# Patient Record
Sex: Female | Born: 1935 | Race: White | Hispanic: No | Marital: Married | Smoking: Current every day smoker
Health system: Southern US, Community
[De-identification: ages and names within clinical notes are randomized; demographics above are authoritative.]

## PROBLEM LIST (undated history)

## (undated) DIAGNOSIS — I951 Orthostatic hypotension: Secondary | ICD-10-CM

## (undated) DIAGNOSIS — N183 Chronic kidney disease, stage 3 unspecified: Secondary | ICD-10-CM

## (undated) DIAGNOSIS — F419 Anxiety disorder, unspecified: Secondary | ICD-10-CM

## (undated) DIAGNOSIS — G459 Transient cerebral ischemic attack, unspecified: Secondary | ICD-10-CM

## (undated) DIAGNOSIS — I4821 Permanent atrial fibrillation: Secondary | ICD-10-CM

## (undated) DIAGNOSIS — Z9889 Other specified postprocedural states: Secondary | ICD-10-CM

## (undated) DIAGNOSIS — Z950 Presence of cardiac pacemaker: Secondary | ICD-10-CM

## (undated) DIAGNOSIS — I498 Other specified cardiac arrhythmias: Secondary | ICD-10-CM

## (undated) DIAGNOSIS — Z9989 Dependence on other enabling machines and devices: Secondary | ICD-10-CM

## (undated) DIAGNOSIS — F329 Major depressive disorder, single episode, unspecified: Secondary | ICD-10-CM

## (undated) DIAGNOSIS — S52022A Displaced fracture of olecranon process without intraarticular extension of left ulna, initial encounter for closed fracture: Secondary | ICD-10-CM

## (undated) DIAGNOSIS — E46 Unspecified protein-calorie malnutrition: Secondary | ICD-10-CM

## (undated) DIAGNOSIS — R51 Headache: Secondary | ICD-10-CM

## (undated) DIAGNOSIS — F32A Depression, unspecified: Secondary | ICD-10-CM

## (undated) DIAGNOSIS — E119 Type 2 diabetes mellitus without complications: Secondary | ICD-10-CM

## (undated) DIAGNOSIS — M8580 Other specified disorders of bone density and structure, unspecified site: Secondary | ICD-10-CM

## (undated) DIAGNOSIS — M1990 Unspecified osteoarthritis, unspecified site: Secondary | ICD-10-CM

## (undated) DIAGNOSIS — J449 Chronic obstructive pulmonary disease, unspecified: Secondary | ICD-10-CM

## (undated) DIAGNOSIS — I10 Essential (primary) hypertension: Secondary | ICD-10-CM

## (undated) DIAGNOSIS — R519 Headache, unspecified: Secondary | ICD-10-CM

## (undated) HISTORY — DX: Permanent atrial fibrillation: I48.21

## (undated) HISTORY — DX: Major depressive disorder, single episode, unspecified: F32.9

## (undated) HISTORY — PX: OTHER SURGICAL HISTORY: SHX169

## (undated) HISTORY — PX: CYST REMOVAL TRUNK: SHX6283

## (undated) HISTORY — DX: Chronic obstructive pulmonary disease, unspecified: J44.9

## (undated) HISTORY — DX: Orthostatic hypotension: I95.1

## (undated) HISTORY — DX: Other specified disorders of bone density and structure, unspecified site: M85.80

## (undated) HISTORY — PX: WISDOM TOOTH EXTRACTION: SHX21

## (undated) HISTORY — DX: Other specified cardiac arrhythmias: I49.8

## (undated) HISTORY — DX: Essential (primary) hypertension: I10

## (undated) HISTORY — DX: Depression, unspecified: F32.A

## (undated) HISTORY — DX: Headache, unspecified: R51.9

## (undated) HISTORY — DX: Presence of cardiac pacemaker: Z95.0

## (undated) HISTORY — DX: Type 2 diabetes mellitus without complications: E11.9

## (undated) HISTORY — DX: Headache: R51

## (undated) HISTORY — PX: PACEMAKER INSERTION: SHX728

## (undated) HISTORY — DX: Transient cerebral ischemic attack, unspecified: G45.9

## (undated) HISTORY — DX: Unspecified protein-calorie malnutrition: E46

## (undated) HISTORY — DX: Other specified postprocedural states: Z98.890

## (undated) HISTORY — DX: Chronic kidney disease, stage 3 unspecified: N18.30

## (undated) HISTORY — DX: Chronic kidney disease, stage 3 (moderate): N18.3

---

## 1998-07-20 ENCOUNTER — Other Ambulatory Visit: Admission: RE | Admit: 1998-07-20 | Discharge: 1998-07-20 | Payer: Self-pay | Admitting: *Deleted

## 1999-07-29 ENCOUNTER — Other Ambulatory Visit: Admission: RE | Admit: 1999-07-29 | Discharge: 1999-07-29 | Payer: Self-pay | Admitting: *Deleted

## 2001-07-30 ENCOUNTER — Encounter: Admission: RE | Admit: 2001-07-30 | Discharge: 2001-07-30 | Payer: Self-pay | Admitting: *Deleted

## 2001-07-30 ENCOUNTER — Encounter: Payer: Self-pay | Admitting: *Deleted

## 2004-12-01 ENCOUNTER — Emergency Department (HOSPITAL_COMMUNITY): Admission: EM | Admit: 2004-12-01 | Discharge: 2004-12-01 | Payer: Self-pay | Admitting: Emergency Medicine

## 2004-12-03 ENCOUNTER — Encounter: Admission: RE | Admit: 2004-12-03 | Discharge: 2004-12-03 | Payer: Self-pay | Admitting: Urology

## 2006-08-24 ENCOUNTER — Inpatient Hospital Stay (HOSPITAL_COMMUNITY): Admission: AD | Admit: 2006-08-24 | Discharge: 2006-08-27 | Payer: Self-pay | Admitting: Cardiology

## 2006-08-28 ENCOUNTER — Inpatient Hospital Stay (HOSPITAL_COMMUNITY): Admission: AD | Admit: 2006-08-28 | Discharge: 2006-09-04 | Payer: Self-pay | Admitting: Cardiology

## 2006-08-28 ENCOUNTER — Ambulatory Visit: Payer: Self-pay | Admitting: Internal Medicine

## 2006-08-31 ENCOUNTER — Encounter (INDEPENDENT_AMBULATORY_CARE_PROVIDER_SITE_OTHER): Payer: Self-pay | Admitting: Cardiology

## 2006-10-05 ENCOUNTER — Inpatient Hospital Stay (HOSPITAL_COMMUNITY): Admission: EM | Admit: 2006-10-05 | Discharge: 2006-10-06 | Payer: Self-pay | Admitting: Emergency Medicine

## 2006-10-06 ENCOUNTER — Encounter (INDEPENDENT_AMBULATORY_CARE_PROVIDER_SITE_OTHER): Payer: Self-pay | Admitting: Internal Medicine

## 2006-10-06 ENCOUNTER — Ambulatory Visit: Payer: Self-pay | Admitting: Vascular Surgery

## 2006-10-23 ENCOUNTER — Ambulatory Visit (HOSPITAL_COMMUNITY): Admission: RE | Admit: 2006-10-23 | Discharge: 2006-10-23 | Payer: Self-pay | Admitting: Cardiology

## 2006-11-08 ENCOUNTER — Inpatient Hospital Stay (HOSPITAL_COMMUNITY): Admission: EM | Admit: 2006-11-08 | Discharge: 2006-11-13 | Payer: Self-pay | Admitting: Emergency Medicine

## 2006-11-08 ENCOUNTER — Ambulatory Visit: Payer: Self-pay | Admitting: Internal Medicine

## 2006-12-31 ENCOUNTER — Inpatient Hospital Stay (HOSPITAL_COMMUNITY): Admission: EM | Admit: 2006-12-31 | Discharge: 2007-01-02 | Payer: Self-pay | Admitting: Emergency Medicine

## 2007-01-28 ENCOUNTER — Ambulatory Visit: Payer: Self-pay | Admitting: Oncology

## 2007-11-22 ENCOUNTER — Emergency Department (HOSPITAL_COMMUNITY): Admission: EM | Admit: 2007-11-22 | Discharge: 2007-11-23 | Payer: Self-pay | Admitting: Emergency Medicine

## 2008-05-28 ENCOUNTER — Ambulatory Visit: Payer: Self-pay | Admitting: Internal Medicine

## 2008-05-28 ENCOUNTER — Inpatient Hospital Stay (HOSPITAL_COMMUNITY): Admission: EM | Admit: 2008-05-28 | Discharge: 2008-06-03 | Payer: Self-pay | Admitting: Emergency Medicine

## 2008-06-06 ENCOUNTER — Encounter: Admission: RE | Admit: 2008-06-06 | Discharge: 2008-06-06 | Payer: Self-pay | Admitting: Family Medicine

## 2008-08-04 ENCOUNTER — Emergency Department (HOSPITAL_COMMUNITY): Admission: EM | Admit: 2008-08-04 | Discharge: 2008-08-05 | Payer: Self-pay | Admitting: Emergency Medicine

## 2008-09-08 ENCOUNTER — Encounter: Admission: RE | Admit: 2008-09-08 | Discharge: 2008-09-08 | Payer: Self-pay | Admitting: Family Medicine

## 2010-05-19 HISTORY — PX: ABLATION: SHX5711

## 2010-06-09 ENCOUNTER — Encounter: Payer: Self-pay | Admitting: Urology

## 2010-07-07 ENCOUNTER — Emergency Department (HOSPITAL_COMMUNITY): Payer: Medicare Other

## 2010-07-07 ENCOUNTER — Inpatient Hospital Stay (HOSPITAL_COMMUNITY)
Admission: EM | Admit: 2010-07-07 | Discharge: 2010-07-09 | DRG: 313 | Disposition: A | Discharge status: Home / Self Care | Payer: Medicare Other | Attending: Cardiology | Admitting: Cardiology

## 2010-07-07 DIAGNOSIS — R079 Chest pain, unspecified: Secondary | ICD-10-CM

## 2010-07-07 DIAGNOSIS — I4892 Unspecified atrial flutter: Secondary | ICD-10-CM | POA: Diagnosis present

## 2010-07-07 DIAGNOSIS — I10 Essential (primary) hypertension: Secondary | ICD-10-CM | POA: Diagnosis present

## 2010-07-07 DIAGNOSIS — Z7982 Long term (current) use of aspirin: Secondary | ICD-10-CM

## 2010-07-07 DIAGNOSIS — Z7901 Long term (current) use of anticoagulants: Secondary | ICD-10-CM

## 2010-07-07 DIAGNOSIS — R0789 Other chest pain: Principal | ICD-10-CM | POA: Diagnosis present

## 2010-07-07 DIAGNOSIS — E119 Type 2 diabetes mellitus without complications: Secondary | ICD-10-CM | POA: Diagnosis present

## 2010-07-07 DIAGNOSIS — E785 Hyperlipidemia, unspecified: Secondary | ICD-10-CM | POA: Diagnosis present

## 2010-07-07 DIAGNOSIS — Z950 Presence of cardiac pacemaker: Secondary | ICD-10-CM

## 2010-07-07 DIAGNOSIS — F17200 Nicotine dependence, unspecified, uncomplicated: Secondary | ICD-10-CM | POA: Diagnosis present

## 2010-07-07 DIAGNOSIS — I495 Sick sinus syndrome: Secondary | ICD-10-CM | POA: Diagnosis present

## 2010-07-07 LAB — CBC
HCT: 40.4 % (ref 36.0–46.0)
Hemoglobin: 13.5 g/dL (ref 12.0–15.0)
MCH: 30.8 pg (ref 26.0–34.0)
MCHC: 33.4 g/dL (ref 30.0–36.0)
MCV: 92 fL (ref 78.0–100.0)
Platelets: 201 10*3/uL (ref 150–400)
RBC: 4.39 MIL/uL (ref 3.87–5.11)
RDW: 13.9 % (ref 11.5–15.5)
WBC: 8.9 10*3/uL (ref 4.0–10.5)

## 2010-07-07 LAB — CARDIAC PANEL(CRET KIN+CKTOT+MB+TROPI)
CK, MB: 1.7 ng/mL (ref 0.3–4.0)
CK, MB: 1.6 ng/mL (ref 0.3–4.0)
Relative Index: INVALID (ref 0.0–2.5)
Relative Index: INVALID (ref 0.0–2.5)
Total CK: 53 U/L (ref 7–177)
Total CK: 58 U/L (ref 7–177)
Troponin I: <0.01 ng/mL (ref 0.00–0.06)
Troponin I: 0.02 ng/mL (ref 0.00–0.06)

## 2010-07-07 LAB — COMPREHENSIVE METABOLIC PANEL
ALT: 19 U/L (ref 0–35)
AST: 23 U/L (ref 0–37)
Albumin: 3.8 g/dL (ref 3.5–5.2)
Alkaline Phosphatase: 61 U/L (ref 39–117)
BUN: 17 mg/dL (ref 6–23)
CO2: 24 mEq/L (ref 19–32)
Calcium: 9.5 mg/dL (ref 8.4–10.5)
Chloride: 102 mEq/L (ref 96–112)
Creatinine, Ser: 0.88 mg/dL (ref 0.4–1.2)
GFR calc Af Amer: >60 mL/min (ref >60)
GFR calc non Af Amer: >60 mL/min (ref >60)
Glucose, Bld: 85 mg/dL (ref 70–99)
Potassium: 4.1 mEq/L (ref 3.5–5.1)
Sodium: 136 mEq/L (ref 135–145)
Total Bilirubin: 0.4 mg/dL (ref 0.3–1.2)
Total Protein: 6.4 g/dL (ref 6.0–8.3)

## 2010-07-07 LAB — APTT: aPTT: 47 seconds — ABNORMAL HIGH (ref 24–37)

## 2010-07-07 LAB — DIFFERENTIAL
Basophils Absolute: 0.1 10*3/uL (ref 0.0–0.1)
Basophils Relative: 1 % (ref 0–1)
Eosinophils Absolute: 0.3 10*3/uL (ref 0.0–0.7)
Eosinophils Relative: 3 % (ref 0–5)
Lymphocytes Relative: 34 % (ref 12–46)
Lymphs Abs: 3 10*3/uL (ref 0.7–4.0)
Monocytes Absolute: 0.7 10*3/uL (ref 0.1–1.0)
Monocytes Relative: 8 % (ref 3–12)
Neutro Abs: 4.8 10*3/uL (ref 1.7–7.7)
Neutrophils Relative %: 54 % (ref 43–77)

## 2010-07-07 LAB — GLUCOSE, CAPILLARY
Glucose-Capillary: 122 mg/dL — ABNORMAL HIGH (ref 70–99)
Glucose-Capillary: 131 mg/dL — ABNORMAL HIGH (ref 70–99)
Glucose-Capillary: 162 mg/dL — ABNORMAL HIGH (ref 70–99)
Glucose-Capillary: 98 mg/dL (ref 70–99)

## 2010-07-07 LAB — CK TOTAL AND CKMB (NOT AT ARMC)
CK, MB: 1.8 ng/mL (ref 0.3–4.0)
Relative Index: INVALID (ref 0.0–2.5)
Total CK: 65 U/L (ref 7–177)

## 2010-07-07 LAB — POCT CARDIAC MARKERS
CKMB, poc: <1 ng/mL — ABNORMAL LOW (ref 1.0–8.0)
Myoglobin, poc: 34.3 ng/mL (ref 12–200)
Troponin i, poc: <0.05 ng/mL (ref 0.00–0.09)

## 2010-07-07 LAB — HEMOGLOBIN A1C
Hgb A1c MFr Bld: 6.7 % — ABNORMAL HIGH (ref <5.7)
Mean Plasma Glucose: 146 mg/dL — ABNORMAL HIGH (ref <117)

## 2010-07-07 LAB — PROTIME-INR
INR: 2.67 — ABNORMAL HIGH (ref 0.00–1.49)
Prothrombin Time: 28.5 seconds — ABNORMAL HIGH (ref 11.6–15.2)

## 2010-07-07 LAB — MRSA PCR SCREENING: MRSA by PCR: NEGATIVE

## 2010-07-07 LAB — TROPONIN I: Troponin I: 0.01 ng/mL (ref 0.00–0.06)

## 2010-07-07 LAB — BRAIN NATRIURETIC PEPTIDE: Pro B Natriuretic peptide (BNP): 46 pg/mL (ref 0.0–100.0)

## 2010-07-08 LAB — GLUCOSE, CAPILLARY
Glucose-Capillary: 158 mg/dL — ABNORMAL HIGH (ref 70–99)
Glucose-Capillary: 146 mg/dL — ABNORMAL HIGH (ref 70–99)
Glucose-Capillary: 151 mg/dL — ABNORMAL HIGH (ref 70–99)
Glucose-Capillary: 158 mg/dL — ABNORMAL HIGH (ref 70–99)

## 2010-07-08 LAB — LIPID PANEL
Cholesterol: 89 mg/dL (ref 0–200)
HDL: 35 mg/dL — ABNORMAL LOW (ref >39)
LDL Cholesterol: 34 mg/dL (ref 0–99)
Total CHOL/HDL Ratio: 2.5 RATIO
Triglycerides: 99 mg/dL (ref <150)
VLDL: 20 mg/dL (ref 0–40)

## 2010-07-08 LAB — PROTIME-INR
INR: 2.08 — ABNORMAL HIGH (ref 0.00–1.49)
Prothrombin Time: 23.5 seconds — ABNORMAL HIGH (ref 11.6–15.2)

## 2010-07-08 LAB — HEPARIN LEVEL (UNFRACTIONATED): Heparin Unfractionated: <0.1 IU/mL — ABNORMAL LOW (ref 0.30–0.70)

## 2010-07-09 LAB — CBC
HCT: 42 % (ref 36.0–46.0)
Hemoglobin: 14.1 g/dL (ref 12.0–15.0)
MCH: 30.5 pg (ref 26.0–34.0)
MCHC: 33.6 g/dL (ref 30.0–36.0)
MCV: 90.9 fL (ref 78.0–100.0)
Platelets: 194 10*3/uL (ref 150–400)
RBC: 4.62 MIL/uL (ref 3.87–5.11)
RDW: 13.5 % (ref 11.5–15.5)
WBC: 10 10*3/uL (ref 4.0–10.5)

## 2010-07-09 LAB — GLUCOSE, CAPILLARY
Glucose-Capillary: 98 mg/dL (ref 70–99)
Glucose-Capillary: 166 mg/dL — ABNORMAL HIGH (ref 70–99)

## 2010-07-09 LAB — HEPARIN LEVEL (UNFRACTIONATED): Heparin Unfractionated: 0.12 IU/mL — ABNORMAL LOW (ref 0.30–0.70)

## 2010-07-09 LAB — PROTIME-INR
INR: 1.54 — ABNORMAL HIGH (ref 0.00–1.49)
Prothrombin Time: 18.7 seconds — ABNORMAL HIGH (ref 11.6–15.2)

## 2010-07-10 NOTE — Discharge Summary (Signed)
[name], [name] NO.:  [id number]  MEDICAL RECORD NO.:  [id number]           PATIENT TYPE:  I  LOCATION:  2604                         FACILITY:  [facility]  PHYSICIAN:  Sherri Pain, MD      DATE OF BIRTH:  1929-03-16  DATE OF ADMISSION:  07/07/2010 DATE OF DISCHARGE:  07/09/2010                              DISCHARGE SUMMARY   PRIMARY CARE PHYSICIAN:  Sherri Contras, MD, [place].  CARDIOLOGIST:  Sherri Pain, MD  FINAL DIAGNOSES: 1. [name]. 2. Hypertension. 3. Hyperlipidemia. 4. Diabetes. 5. Smoking. 6. Atrial flutter, status post ablation. 7. Tachybrady syndrome, status post pacemaker/secondary to     atrioventricular node ablation. 8. Tobacco abuse.  HOSPITAL COURSE:  A 77 year old female who was admitted on July 07, 2010, the day after she underwent a nuclear stress test in the office setting, which was low risk showing no evidence of ischemia and a normal EF (last Friday), who stated that she was in her usual state of health until about 3 p.m. on July 06, 2010, where she experienced some [name] with heaviness under her left breast, radiation down to her left arm.  She has no prior history of coronary artery disease and as I stated above, she just had a nuclear stress test done last Friday, which was low risk.  Since she was here in the hospital, she had cardiac biomarkers drawn, all of which were normal.  Her hemoglobin A1c was 6.7, mildly elevated and her LDL cholesterol was 34.  CBC:  White count is 10, hemoglobin 14, hematocrit 42, platelet count 194, and her INR on discharge was 1.54 secondary to holding Coumadin for a possible cardiac catheterization. Her BNP was 46, normal.  Liver enzymes were also checked, which were all normal.  Her blood pressure has fluctuated slightly during this hospitalization as high as [id number] systolic, but has been controlled in the 130s range.  She is afebrile.  She is ambulating  well without difficulty.  She did state that she had some waves of nausea at times, and some hot flash, tight feelings with some warmth on the right side of her face, which is intermittent, but now resolved.  She denies any further Sherri discomfort.  When I asked her about exertional discomfort at home, she did deny, although this was stated in her H and P.  She is ambulating well currently without any difficulties.  DISCHARGE MEDICATIONS: 1. Nitroglycerin 0.4 mg p.r.n. sublingual. 2. Aspirin 81 mg a day. 3. Ativan 1 mg twice a day, 1/2 tablet as needed. 4. Atorvastatin 10 mg a day. 5. Lexapro 20 mg a day. 6. Lunesta 3 mg at bedtime as needed. 7. Metformin 1000 mg twice a day. 8. Micardis 20 mg a day. 9. Multivitamins. 10.Warfarin 2 mg Tuesday, Thursday, Saturday, and Sunday; 3 mg Monday,     Wednesday, and Friday.  She is to resume her normal dosing and get     this checked by [name], PharmD at [facility]  ALLERGIES:  DOFETILIDE caused anaphylaxis and ROSUVASTATIN intolerances noted.  As of note, with  her elevated blood pressure, she did state that the Micardis has been doing a good job at home, and she is eager to get back on this regimen.  Also, I did explain to her nuclear stress test findings, which were reassuring.  Of course, if her symptoms progress or become more worrisome, the next step would be for cardiac catheterization.  I did discuss with her this morning, the possibility of performing a cardiac catheterization, and she stated that she did not think that she needed one at this time, and I agree given her negative cardiac biomarkers and recent negative or low-risk stress test on last Friday.  We will continue to monitor medically.  I did ask her to see Dr. Moreen [name] sometime this week to discuss the symptoms of nausea at times as well as some of the hot flash-like symptoms she has been having.  Her EKGs on this admission show a left-bundle appearance due to  ventricular pacing with underlying atrial flutter/fib.  I will see her back in clinic in 2 weeks and as I stated above, hopefully she will see Dr. Moreen [name], her primary care physician in the next few days.  DISCHARGE TIME:  Thirty-five minutes with medication reconciliation, review of hospital records, and discussion with the patient.     Sherri Pain, MD     MCS/MEDQ  D:  07/09/2010  T:  07/09/2010  Job:  [id number]  cc:   [name], M.D.  Electronically Signed by Sherri Furbish MD on 07/10/2010 09:24:13 PM

## 2010-07-12 NOTE — H&P (Signed)
[name], [name]                 ACCOUNT NO.:  [id number]  MEDICAL RECORD NO.:  [id number]           PATIENT TYPE:  E  LOCATION:  [facility]                         FACILITY:  [place]  PHYSICIAN:  Dub Amis, MD  DATE OF BIRTH:  26-Oct-1928  DATE OF ADMISSION:  07/07/2010 DATE OF DISCHARGE:                             HISTORY & PHYSICAL   CARDIOLOGIST:  Elta Guadeloupe C. Marlou Porch, MD  PRIMARY CARE PHYSICIAN:  Antony Contras, MD  CHIEF COMPLAINT:  Chest pain.  HISTORY OF PRESENT ILLNESS:  [name] is a 77 year old lady with history of hypertension, hyperlipidemia, diabetes, smoking, atrial flutter status post ablation, tachybrady syndrome status post pacemaker placement, who is here with chest pain consistent with unstable angina.  The patient reports that she was in her usual state of health up until 3 p.m. on July 06, 2010.  She then experienced chest pain which is described as heaviness underneath her left breast with radiation down her left arm.  The pain lasted up until EMS arrived on the scene and gave her a single dose of sublingual nitroglycerin at approximately 11 p.m.  Her husband mentions that the pain was worse with exertion and better with rest.  She also had symptoms of dyspnea on exertion.  She has no known history of coronary artery disease.  Of note, the patient mentioned that she had a nuclear stress test done at her primary cardiologist's office on Friday, July 05, 2010.  We do not have the results of that stress test.  REVIEW OF SYSTEMS:  A 12-point review of systems is negative except for symptoms outlined in the HPI above and symptoms of generalized fatigue.  CURRENT MEDICATIONS: 1. Aspirin 81 mg once a day. 2. Warfarin 1 mg once a day. 3. Lipitor 10 mg once a day. 4. Ativan 1 mg twice a day as needed. 5. Lunesta 3 mg nightly p.r.n. 6. Metformin 1 g twice a day. 7. Micardis 20 mg once a day. 8. Lexapro 20 mg once a day. 9. Lipitor 10 mg once a  day.  PAST MEDICAL HISTORY: 1. Atrial flutter status post ablation. 2. History of AFib, on warfarin. 3. Tachybrady syndrome status post pacemaker placement. 4. Diabetes. 5. Hypertension. 6. Hyperlipidemia. 7. Tobacco use, ongoing 8. COPD. 9. Depression.  SOCIAL HISTORY:  She is retired.  She lives with her husband.  She continues to smoke 3-4 cigarettes a day.  She denies any history of alcohol use or illicit drug use.  FAMILY HISTORY:  Noncontributory.  FAMILY HISTORY:  She has no history of early coronary disease in her family.  PHYSICAL EXAMINATION:  VITAL SIGNS:  Temperature is 98.1, pulse of 63, respiratory rate 19, blood pressure 141/71, and saturating 95% on room air. GENERAL:  Well appearing lady in no apparent distress. HEENT:  She has a surgical pupils.  Sclera is clear.  Extraocular movements intact. NECK:  No carotid bruit.  Her jugular venous distention is at the angle of the jaw on a 45-degree angle, estimated to be approximately 9-10 cm of water. CARDIOVASCULAR:  Regular rate and rhythm. LUNGS:  She has bibasilar crackles. SKIN:  No rashes or lesions. ABDOMEN:  Soft, nontender, nondistended. EXTREMITIES:  No signs of clubbing, cyanosis, or edema. NEURO:  Alert and oriented.  EKG shows AFib with V-paced rhythm at the rate of 60 beats a minute.  LABORATORY DATA:  Her CBC and chemistries are unremarkable.  Her INR is 2.67.  Her first set of cardiac markers are negative.  ASSESSMENT AND PLAN:  [name] is a 77 year old lady whose risk factors include age, hypertension, hyperlipidemia, diabetes, smoking, who is being admitted with symptoms of unstable angina. 1. Unstable angina:  As reported in HPI above she mentioned that she     had a nuclear stress test done at Dr. Marlou Porch' office on the 17th of     this month.  Unfortunately we do not have the results of tests.  My     plan is to admit her to rule out for an MI.  She may need a cardiac      catheterization on Monday depending on how her serial cardiac     biomarkers trend and/or the results of her stress test.  For now, I     will start her on full-dose aspirin, beta-blocker, and statin.  I     am holding heparin drip since she is fully anticoagulated on     warfarin with an INR of 2.67. 2. Diabetes:  I will hold metformin in the event she needs contrast     for her cath.  In the meanwhile, we will look her on insulin     sliding scale. 3. History of hyperlipidemia:  She takes Lipitor 10 mg p.o. daily at     home.  I will increase it to 80 mg today and sent for fasting lipid     panel with morning labs. 4. History of hypertension:  We will continue her home     antihypertensives, her blood pressure is fairly well-controlled     with that.  Initial blood pressure 141/71 and a repeat blood     pressure of 130/70.  CODE STATUS:  The patient is full code.     Dub Amis, MD     PV/MEDQ  D:  07/07/2010  T:  07/07/2010  Job:  [id number]  Electronically Signed by Dub Amis MD on 07/12/2010 08:59:57 PM

## 2010-08-29 LAB — CBC
HCT: 30.7 % — ABNORMAL LOW (ref 36.0–46.0)
Hemoglobin: 9.9 g/dL — ABNORMAL LOW (ref 12.0–15.0)
MCHC: 32.2 g/dL (ref 30.0–36.0)
MCV: 86.8 fL (ref 78.0–100.0)
Platelets: 297 10*3/uL (ref 150–400)
RBC: 3.54 MIL/uL — ABNORMAL LOW (ref 3.87–5.11)
RDW: 17.2 % — ABNORMAL HIGH (ref 11.5–15.5)
WBC: 7.3 10*3/uL (ref 4.0–10.5)

## 2010-08-29 LAB — POCT CARDIAC MARKERS
CKMB, poc: <1 ng/mL — ABNORMAL LOW (ref 1.0–8.0)
Myoglobin, poc: 36.2 ng/mL (ref 12–200)
Troponin i, poc: <0.05 ng/mL (ref 0.00–0.09)

## 2010-08-29 LAB — POCT I-STAT, CHEM 8
BUN: 18 mg/dL (ref 6–23)
Calcium, Ion: 1.15 mmol/L (ref 1.12–1.32)
Chloride: 104 mEq/L (ref 96–112)
Creatinine, Ser: 0.6 mg/dL (ref 0.4–1.2)
Glucose, Bld: 160 mg/dL — ABNORMAL HIGH (ref 70–99)
HCT: 34 % — ABNORMAL LOW (ref 36.0–46.0)
Hemoglobin: 11.6 g/dL — ABNORMAL LOW (ref 12.0–15.0)
Potassium: 4.1 mEq/L (ref 3.5–5.1)
Sodium: 140 mEq/L (ref 135–145)
TCO2: 25 mmol/L (ref 0–100)

## 2010-08-29 LAB — DIFFERENTIAL
Basophils Absolute: 0 10*3/uL (ref 0.0–0.1)
Basophils Relative: 1 % (ref 0–1)
Eosinophils Absolute: 0.1 10*3/uL (ref 0.0–0.7)
Eosinophils Relative: 2 % (ref 0–5)
Lymphocytes Relative: 21 % (ref 12–46)
Lymphs Abs: 1.5 10*3/uL (ref 0.7–4.0)
Monocytes Absolute: 0.9 10*3/uL (ref 0.1–1.0)
Monocytes Relative: 12 % (ref 3–12)
Neutro Abs: 4.8 10*3/uL (ref 1.7–7.7)
Neutrophils Relative %: 65 % (ref 43–77)

## 2010-09-02 LAB — CBC
HCT: 27.6 % — ABNORMAL LOW (ref 36.0–46.0)
HCT: 28.8 % — ABNORMAL LOW (ref 36.0–46.0)
HCT: 29.4 % — ABNORMAL LOW (ref 36.0–46.0)
HCT: 31 % — ABNORMAL LOW (ref 36.0–46.0)
HCT: 31.3 % — ABNORMAL LOW (ref 36.0–46.0)
HCT: 30.8 % — ABNORMAL LOW (ref 36.0–46.0)
Hemoglobin: 10 g/dL — ABNORMAL LOW (ref 12.0–15.0)
Hemoglobin: 10 g/dL — ABNORMAL LOW (ref 12.0–15.0)
Hemoglobin: 8.9 g/dL — ABNORMAL LOW (ref 12.0–15.0)
Hemoglobin: 9.3 g/dL — ABNORMAL LOW (ref 12.0–15.0)
Hemoglobin: 9.6 g/dL — ABNORMAL LOW (ref 12.0–15.0)
Hemoglobin: 9.8 g/dL — ABNORMAL LOW (ref 12.0–15.0)
MCHC: 32 g/dL (ref 30.0–36.0)
MCHC: 32.2 g/dL (ref 30.0–36.0)
MCHC: 32.2 g/dL (ref 30.0–36.0)
MCHC: 32.4 g/dL (ref 30.0–36.0)
MCHC: 32.5 g/dL (ref 30.0–36.0)
MCHC: 31.9 g/dL (ref 30.0–36.0)
MCV: 84.2 fL (ref 78.0–100.0)
MCV: 84.4 fL (ref 78.0–100.0)
MCV: 85 fL (ref 78.0–100.0)
MCV: 85.4 fL (ref 78.0–100.0)
MCV: 85.9 fL (ref 78.0–100.0)
MCV: 86.3 fL (ref 78.0–100.0)
Platelets: 345 10*3/uL (ref 150–400)
Platelets: 364 10*3/uL (ref 150–400)
Platelets: 402 10*3/uL — ABNORMAL HIGH (ref 150–400)
Platelets: 463 10*3/uL — ABNORMAL HIGH (ref 150–400)
Platelets: 465 10*3/uL — ABNORMAL HIGH (ref 150–400)
Platelets: 526 10*3/uL — ABNORMAL HIGH (ref 150–400)
RBC: 3.22 MIL/uL — ABNORMAL LOW (ref 3.87–5.11)
RBC: 3.41 MIL/uL — ABNORMAL LOW (ref 3.87–5.11)
RBC: 3.49 MIL/uL — ABNORMAL LOW (ref 3.87–5.11)
RBC: 3.65 MIL/uL — ABNORMAL LOW (ref 3.87–5.11)
RBC: 3.67 MIL/uL — ABNORMAL LOW (ref 3.87–5.11)
RBC: 3.57 MIL/uL — ABNORMAL LOW (ref 3.87–5.11)
RDW: 16.3 % — ABNORMAL HIGH (ref 11.5–15.5)
RDW: 16.8 % — ABNORMAL HIGH (ref 11.5–15.5)
RDW: 17 % — ABNORMAL HIGH (ref 11.5–15.5)
RDW: 17.2 % — ABNORMAL HIGH (ref 11.5–15.5)
RDW: 17.4 % — ABNORMAL HIGH (ref 11.5–15.5)
RDW: 16.8 % — ABNORMAL HIGH (ref 11.5–15.5)
WBC: 10.2 10*3/uL (ref 4.0–10.5)
WBC: 11.1 10*3/uL — ABNORMAL HIGH (ref 4.0–10.5)
WBC: 12.2 10*3/uL — ABNORMAL HIGH (ref 4.0–10.5)
WBC: 13 10*3/uL — ABNORMAL HIGH (ref 4.0–10.5)
WBC: 9.6 10*3/uL (ref 4.0–10.5)
WBC: 10.4 10*3/uL (ref 4.0–10.5)

## 2010-09-02 LAB — RETICULOCYTES
RBC.: 3.46 MIL/uL — ABNORMAL LOW (ref 3.87–5.11)
Retic Count, Absolute: 48.4 10*3/uL (ref 19.0–186.0)
Retic Ct Pct: 1.4 % (ref 0.4–3.1)

## 2010-09-02 LAB — GLUCOSE, CAPILLARY
Glucose-Capillary: 139 mg/dL — ABNORMAL HIGH (ref 70–99)
Glucose-Capillary: 153 mg/dL — ABNORMAL HIGH (ref 70–99)
Glucose-Capillary: 175 mg/dL — ABNORMAL HIGH (ref 70–99)
Glucose-Capillary: 182 mg/dL — ABNORMAL HIGH (ref 70–99)
Glucose-Capillary: 189 mg/dL — ABNORMAL HIGH (ref 70–99)
Glucose-Capillary: 197 mg/dL — ABNORMAL HIGH (ref 70–99)
Glucose-Capillary: 197 mg/dL — ABNORMAL HIGH (ref 70–99)
Glucose-Capillary: 209 mg/dL — ABNORMAL HIGH (ref 70–99)
Glucose-Capillary: 209 mg/dL — ABNORMAL HIGH (ref 70–99)
Glucose-Capillary: 210 mg/dL — ABNORMAL HIGH (ref 70–99)
Glucose-Capillary: 221 mg/dL — ABNORMAL HIGH (ref 70–99)
Glucose-Capillary: 222 mg/dL — ABNORMAL HIGH (ref 70–99)
Glucose-Capillary: 222 mg/dL — ABNORMAL HIGH (ref 70–99)
Glucose-Capillary: 224 mg/dL — ABNORMAL HIGH (ref 70–99)
Glucose-Capillary: 226 mg/dL — ABNORMAL HIGH (ref 70–99)
Glucose-Capillary: 241 mg/dL — ABNORMAL HIGH (ref 70–99)
Glucose-Capillary: 249 mg/dL — ABNORMAL HIGH (ref 70–99)
Glucose-Capillary: 254 mg/dL — ABNORMAL HIGH (ref 70–99)
Glucose-Capillary: 255 mg/dL — ABNORMAL HIGH (ref 70–99)
Glucose-Capillary: 295 mg/dL — ABNORMAL HIGH (ref 70–99)
Glucose-Capillary: 299 mg/dL — ABNORMAL HIGH (ref 70–99)
Glucose-Capillary: 107 mg/dL — ABNORMAL HIGH (ref 70–99)
Glucose-Capillary: 146 mg/dL — ABNORMAL HIGH (ref 70–99)

## 2010-09-02 LAB — EXPECTORATED SPUTUM ASSESSMENT W GRAM STAIN, RFLX TO RESP C

## 2010-09-02 LAB — DIFFERENTIAL
Basophils Absolute: 0 10*3/uL (ref 0.0–0.1)
Basophils Absolute: 0.1 10*3/uL (ref 0.0–0.1)
Basophils Absolute: 0.1 10*3/uL (ref 0.0–0.1)
Basophils Relative: 0 % (ref 0–1)
Basophils Relative: 1 % (ref 0–1)
Basophils Relative: 1 % (ref 0–1)
Eosinophils Absolute: 0.1 10*3/uL (ref 0.0–0.7)
Eosinophils Absolute: 0.2 10*3/uL (ref 0.0–0.7)
Eosinophils Absolute: 0.2 10*3/uL (ref 0.0–0.7)
Eosinophils Relative: 1 % (ref 0–5)
Eosinophils Relative: 2 % (ref 0–5)
Eosinophils Relative: 2 % (ref 0–5)
Lymphocytes Relative: 14 % (ref 12–46)
Lymphocytes Relative: 8 % — ABNORMAL LOW (ref 12–46)
Lymphocytes Relative: 17 % (ref 12–46)
Lymphs Abs: 1 10*3/uL (ref 0.7–4.0)
Lymphs Abs: 1.7 10*3/uL (ref 0.7–4.0)
Lymphs Abs: 1.8 10*3/uL (ref 0.7–4.0)
Monocytes Absolute: 1.1 10*3/uL — ABNORMAL HIGH (ref 0.1–1.0)
Monocytes Absolute: 1.2 10*3/uL — ABNORMAL HIGH (ref 0.1–1.0)
Monocytes Absolute: 1 10*3/uL (ref 0.1–1.0)
Monocytes Relative: 9 % (ref 3–12)
Monocytes Relative: 9 % (ref 3–12)
Monocytes Relative: 10 % (ref 3–12)
Neutro Abs: 10.7 10*3/uL — ABNORMAL HIGH (ref 1.7–7.7)
Neutro Abs: 9.1 10*3/uL — ABNORMAL HIGH (ref 1.7–7.7)
Neutro Abs: 7.4 10*3/uL (ref 1.7–7.7)
Neutrophils Relative %: 75 % (ref 43–77)
Neutrophils Relative %: 82 % — ABNORMAL HIGH (ref 43–77)
Neutrophils Relative %: 71 % (ref 43–77)

## 2010-09-02 LAB — BASIC METABOLIC PANEL
BUN: 12 mg/dL (ref 6–23)
BUN: 13 mg/dL (ref 6–23)
BUN: 15 mg/dL (ref 6–23)
BUN: 16 mg/dL (ref 6–23)
BUN: 17 mg/dL (ref 6–23)
BUN: 9 mg/dL (ref 6–23)
BUN: 16 mg/dL (ref 6–23)
CO2: 23 mEq/L (ref 19–32)
CO2: 27 mEq/L (ref 19–32)
CO2: 28 mEq/L (ref 19–32)
CO2: 28 mEq/L (ref 19–32)
CO2: 30 mEq/L (ref 19–32)
CO2: 30 mEq/L (ref 19–32)
CO2: 31 mEq/L (ref 19–32)
Calcium: 8.5 mg/dL (ref 8.4–10.5)
Calcium: 8.7 mg/dL (ref 8.4–10.5)
Calcium: 8.7 mg/dL (ref 8.4–10.5)
Calcium: 8.8 mg/dL (ref 8.4–10.5)
Calcium: 8.9 mg/dL (ref 8.4–10.5)
Calcium: 9 mg/dL (ref 8.4–10.5)
Calcium: 8.9 mg/dL (ref 8.4–10.5)
Chloride: 101 mEq/L (ref 96–112)
Chloride: 102 mEq/L (ref 96–112)
Chloride: 95 mEq/L — ABNORMAL LOW (ref 96–112)
Chloride: 97 mEq/L (ref 96–112)
Chloride: 98 mEq/L (ref 96–112)
Chloride: 98 mEq/L (ref 96–112)
Chloride: 98 mEq/L (ref 96–112)
Creatinine, Ser: 0.78 mg/dL (ref 0.4–1.2)
Creatinine, Ser: 0.94 mg/dL (ref 0.4–1.2)
Creatinine, Ser: 0.98 mg/dL (ref 0.4–1.2)
Creatinine, Ser: 0.98 mg/dL (ref 0.4–1.2)
Creatinine, Ser: 0.99 mg/dL (ref 0.4–1.2)
Creatinine, Ser: 1.08 mg/dL (ref 0.4–1.2)
Creatinine, Ser: 1.1 mg/dL (ref 0.4–1.2)
GFR calc Af Amer: 60 mL/min — ABNORMAL LOW (ref >60)
GFR calc Af Amer: >60 mL/min (ref >60)
GFR calc Af Amer: >60 mL/min (ref >60)
GFR calc Af Amer: >60 mL/min (ref >60)
GFR calc Af Amer: >60 mL/min (ref >60)
GFR calc Af Amer: >60 mL/min (ref >60)
GFR calc Af Amer: 58 mL/min — ABNORMAL LOW (ref >60)
GFR calc non Af Amer: 49 mL/min — ABNORMAL LOW (ref >60)
GFR calc non Af Amer: 54 mL/min — ABNORMAL LOW (ref >60)
GFR calc non Af Amer: 55 mL/min — ABNORMAL LOW (ref >60)
GFR calc non Af Amer: 55 mL/min — ABNORMAL LOW (ref >60)
GFR calc non Af Amer: 58 mL/min — ABNORMAL LOW (ref >60)
GFR calc non Af Amer: >60 mL/min (ref >60)
GFR calc non Af Amer: 48 mL/min — ABNORMAL LOW (ref >60)
Glucose, Bld: 112 mg/dL — ABNORMAL HIGH (ref 70–99)
Glucose, Bld: 124 mg/dL — ABNORMAL HIGH (ref 70–99)
Glucose, Bld: 152 mg/dL — ABNORMAL HIGH (ref 70–99)
Glucose, Bld: 153 mg/dL — ABNORMAL HIGH (ref 70–99)
Glucose, Bld: 272 mg/dL — ABNORMAL HIGH (ref 70–99)
Glucose, Bld: 292 mg/dL — ABNORMAL HIGH (ref 70–99)
Glucose, Bld: 97 mg/dL (ref 70–99)
Potassium: 3.4 mEq/L — ABNORMAL LOW (ref 3.5–5.1)
Potassium: 3.4 mEq/L — ABNORMAL LOW (ref 3.5–5.1)
Potassium: 3.6 mEq/L (ref 3.5–5.1)
Potassium: 3.8 mEq/L (ref 3.5–5.1)
Potassium: 4.1 mEq/L (ref 3.5–5.1)
Potassium: 4.3 mEq/L (ref 3.5–5.1)
Potassium: 4.8 mEq/L (ref 3.5–5.1)
Sodium: 132 mEq/L — ABNORMAL LOW (ref 135–145)
Sodium: 135 mEq/L (ref 135–145)
Sodium: 136 mEq/L (ref 135–145)
Sodium: 137 mEq/L (ref 135–145)
Sodium: 137 mEq/L (ref 135–145)
Sodium: 140 mEq/L (ref 135–145)
Sodium: 135 mEq/L (ref 135–145)

## 2010-09-02 LAB — POCT CARDIAC MARKERS
CKMB, poc: <1 ng/mL — ABNORMAL LOW (ref 1.0–8.0)
Myoglobin, poc: 124 ng/mL (ref 12–200)
Troponin i, poc: <0.05 ng/mL (ref 0.00–0.09)

## 2010-09-02 LAB — URINE CULTURE: Colony Count: >=100000

## 2010-09-02 LAB — PROTIME-INR
INR: 2.2 — ABNORMAL HIGH (ref 0.00–1.49)
INR: 2.5 — ABNORMAL HIGH (ref 0.00–1.49)
INR: 2.7 — ABNORMAL HIGH (ref 0.00–1.49)
INR: 3.7 — ABNORMAL HIGH (ref 0.00–1.49)
INR: 5.4 (ref 0.00–1.49)
INR: 5.8 (ref 0.00–1.49)
INR: 2.4 — ABNORMAL HIGH (ref 0.00–1.49)
Prothrombin Time: 26.1 seconds — ABNORMAL HIGH (ref 11.6–15.2)
Prothrombin Time: 29 seconds — ABNORMAL HIGH (ref 11.6–15.2)
Prothrombin Time: 30.3 seconds — ABNORMAL HIGH (ref 11.6–15.2)
Prothrombin Time: 40 seconds — ABNORMAL HIGH (ref 11.6–15.2)
Prothrombin Time: 54.4 seconds — ABNORMAL HIGH (ref 11.6–15.2)
Prothrombin Time: 57.8 seconds — ABNORMAL HIGH (ref 11.6–15.2)
Prothrombin Time: 28.1 seconds — ABNORMAL HIGH (ref 11.6–15.2)

## 2010-09-02 LAB — CARDIAC PANEL(CRET KIN+CKTOT+MB+TROPI)
CK, MB: 1 ng/mL (ref 0.3–4.0)
CK, MB: 1.1 ng/mL (ref 0.3–4.0)
Relative Index: INVALID (ref 0.0–2.5)
Relative Index: INVALID (ref 0.0–2.5)
Total CK: 26 U/L (ref 7–177)
Total CK: 35 U/L (ref 7–177)
Troponin I: 0.01 ng/mL (ref 0.00–0.06)
Troponin I: <0.01 ng/mL (ref 0.00–0.06)

## 2010-09-02 LAB — HEMOGLOBIN A1C
Hgb A1c MFr Bld: 7.1 % — ABNORMAL HIGH (ref 4.6–6.1)
Mean Plasma Glucose: 157 mg/dL

## 2010-09-02 LAB — VITAMIN B12: Vitamin B-12: 1331 pg/mL — ABNORMAL HIGH (ref 211–911)

## 2010-09-02 LAB — TSH: TSH: 1.216 u[IU]/mL (ref 0.350–4.500)

## 2010-09-02 LAB — IRON AND TIBC
Iron: 30 ug/dL — ABNORMAL LOW (ref 42–135)
Saturation Ratios: 9 % — ABNORMAL LOW (ref 20–55)
TIBC: 318 ug/dL (ref 250–470)
UIBC: 288 ug/dL

## 2010-09-02 LAB — BRAIN NATRIURETIC PEPTIDE
Pro B Natriuretic peptide (BNP): 231 pg/mL — ABNORMAL HIGH (ref 0.0–100.0)
Pro B Natriuretic peptide (BNP): 51 pg/mL (ref 0.0–100.0)
Pro B Natriuretic peptide (BNP): 701 pg/mL — ABNORMAL HIGH (ref 0.0–100.0)
Pro B Natriuretic peptide (BNP): 77 pg/mL (ref 0.0–100.0)
Pro B Natriuretic peptide (BNP): 792 pg/mL — ABNORMAL HIGH (ref 0.0–100.0)

## 2010-09-02 LAB — FERRITIN: Ferritin: 91 ng/mL (ref 10–291)

## 2010-09-02 LAB — FOLATE: Folate: >20 ng/mL

## 2010-10-01 NOTE — H&P (Signed)
[name], [name]                 ACCOUNT NO.:  [id number]   MEDICAL RECORD NO.:  [id number]          PATIENT TYPE:  INP   LOCATION:  [id number]                         FACILITY:  [facility]   PHYSICIAN:  Annita Brod, M.D.DATE OF BIRTH:  1929-04-13   DATE OF ADMISSION:  11/08/2006  DATE OF DISCHARGE:                              HISTORY & PHYSICAL   PRIMARY CARE PHYSICIAN:  Antony Contras, M.D.   CHIEF COMPLAINT:  Weakness.   HISTORY OF PRESENT ILLNESS:  The patient is a 77 year old white female  with past medical history of diabetes, hypertension, dyslipidemia, and  paroxysmal atrial fibrillation who was last on the [facility]  [facility] approximately 1 month ago for TIA.  She has been relatively  well, but in the last 1-2 days she has had progressively worsening  weakness.  It is not focal weakness, but described rather as if she has  general malaise-like symptoms, but is so weak that she can barely get  out of bed.  When she came into the emergency room, she was noted to  have a digoxin level of 2.8, the high end number being at 2.  In  addition she was found to have a white count of 15.4 with 85% shift.  The rest of her labs are noted for an NP of 357, with normal renal  function, slightly elevated blood glucose at 157, and a normal UA.  Cardiac markers were unremarkable.  EKG on this patient showed atrial  fibrillation with some anterior T waves.  When compared to an EKG done  in April of 2008, those T waves at that time were upright.  Currently  the patient is still doing okay.  She is still complaining of  generalized weakness, but otherwise doing well.  She denies any  headaches, vision changes, dysphagia, chest pain, palpitations,  shortness of breath, wheeze, cough, abdominal pain, hematuria, dysuria,  constipation, diarrhea, focal extremity numbness, weakness, or pain.   REVIEW OF SYSTEMS:  Otherwise negative.   PAST MEDICAL HISTORY:  Paroxysmal atrial fibrillation,  TIA's, CAD,  diabetes mellitus, hypertension, hyperlipidemia, and anxiety.   MEDICATIONS:  1. Aspirin 325 mg.  2. Amiodarone 200 mg.  3. Coumadin 1 mg.  4. Metformin 100 mg p.o. b.i.d.  5. Crestor 10 mg.  6. Metoprolol 100 mg p.o. b.i.d.  7. Xanax 0.25 mg p.o. daily p.r.n.  8. Benicar 10 mg p.o. daily.  9. Cardizem 240 mg p.o. daily.   ALLERGIES:  No known drug allergies.   SOCIAL HISTORY:  She denies any tobacco, alcohol, or drug use.   FAMILY HISTORY:  Noncontributory.   PHYSICAL EXAMINATION:  VITAL SIGNS:  Temperature 97, heart rate 55,  blood pressure 102/52, since then it has improved to 137/67,  respirations 18, O2 saturation 96% on room air.  GENERAL:  The patient is alert and oriented x3 in no acute distress.  HEENT:  Normocephalic and atraumatic.  Her mucous membranes are slightly  dry.  She has no carotid bruits.  HEART:  Irregular rhythm, but rate controlled.  LUNGS:  Bibasilar crackles.  ABDOMEN:  Soft, nontender,  and nondistended.  Positive bowel sounds.  EXTREMITIES:  No cyanosis, clubbing, or edema.   LABORATORY DATA:  BNP 357, white count 15.4, H&H 12.4 and 37.5, MCV 93,  platelet count 299, 85% shift. Sodium 132, potassium 5, chloride 96,  bicarb 26, BUN 16, creatinine 0.9, glucose 157.  LFT's are notable for  albumin of 3.1.  INR is therapeutic at 3.1.  UA has small leukocyte  esterase but normal micro.  CPK 44.2, MB less than 1, troponin I less  than 0.05.  Digoxin 2.8.   ASSESSMENT:  1. Weakness.  This may be possibly from digoxin toxicity with her high      level.  We will plan to gently hydrate and repeat digoxin level in      the morning.  2. Leukocytosis which may be related to digoxin toxicity.  Currently      the pack system is down and we are unable to check x-rays.  If she      does indeed have a pneumonia, we will add Avelox and recheck her      labs in the morning.  3. EKG changes.  Certainly concerning.  We will repeat EKG in the       morning as well as check two more sets of cardiac markers.  4. Diabetes mellitus.  Continue Metformin and sliding scale.  5. History of atrial fibrillation.  Continue amiodarone and Cardizem      and Coumadin.      Annita Brod, M.D.  Electronically Signed     SKK/MEDQ  D:  11/08/2006  T:  11/09/2006  Job:  [id number]   cc:   Antony Contras, M.D.  Barnett Abu, M.D.

## 2010-10-01 NOTE — Discharge Summary (Signed)
[name], [name]                 ACCOUNT NO.:  [id number]   MEDICAL RECORD NO.:  [id number]          PATIENT TYPE:  INP   LOCATION:  [id number]                         FACILITY:  [place]   PHYSICIAN:  Bonnell Public, MDDATE OF BIRTH:  1929-02-28   DATE OF ADMISSION:  12/31/2006  DATE OF DISCHARGE:  01/02/2007                               DISCHARGE SUMMARY   The primary care Dionisio Aragones is Antony Contras, M.D.   DISCHARGE DIAGNOSES:  1. Urinary tract infection.  2. Dehydration, resolved.  3. Acute renal failure, resolved.  4. Nausea and vomiting, resolved.  5. Diarrhea, resolved.  6. Diabetes mellitus.  7. History of atrial fibrillation.  8. Weakness and fatigue, resolved.  9. Likely fungal infection, dorsum of right foot, significantly      improved.   DISCHARGE MEDICATIONS:  1. Amiodarone 100 mg p.o. once daily.  2. Metoprolol 100 mg p.o. twice daily.  3. Cardizem 180 mg p.o. once daily.  4. Coumadin 1 mg to alternate with 0.5 mg p.o. once daily (i.e., 1 mg      on Monday, Wednesday, Friday and 0.5 mg on Tuesday, Thursday,      etc.).  5. Metformin 1000 mg twice daily.  6. Benicar 10 mg p.o. once daily.  7. Aspirin 81 mg p.o. once daily.  8. Multivitamin one tablet p.o. once daily.  9. Tums p.r.n.  10.Xanax 0.5 mg p.r.n.  11.Paroxetine 10 mg p.o. once daily.  12.Clotrimazole cream applied twice daily for a week and reviewed.   PERTINENT LABS:  On admission, the patient's BUN was 23 and the  creatinine was 2.18.  UA was positive for nitrite.   BRIEF HISTORY AND HOSPITAL COURSE:  Refer to the H&P done on December 31, 2006.  The patient is a 77 year old female with past medical history  significant for atrial fibrillation, status post pacemaker placement,  diabetes and dyslipidemia.  The patient was admitted with UTI, acute  renal failure, nausea and vomiting and diarrhea.   The patient was admitted to a telemetry floor.  The patient was  adequately rehydrated.  The  creatinine on the day of discharge was 0.81.  The patient was managed with IV imipenem.  On discharge, the antibiotics  was changed to Cipro 500 mg p.o. b.i.d. for the next 3 days.  Urine  culture was sent but is still pending.  The likely fungal infection on  the dorsum of the right foot was treated with clotrimazole.  The patient  is to continue clotrimazole twice daily for the next one week.   The patient has done well and will be discharged back home today to the  care of her primary care Mamye Bolds.  INR on admission was 3.1.  On  discharge it was 2.4.   DISCHARGE PLAN:  1. To discharge the patient home on the above medications.  2. Follow up with the primary care Tajuana Kniskern in a week.  3. Follow up with the cardiologist.  4. Check INR in the cardiologist's office in the next 2 days (January 04, 2007).  5. ADA  diet 1800 kilocalories.  6. Activity as tolerated.      Bonnell Public, MD  Electronically Signed     SIO/MEDQ  D:  01/02/2007  T:  01/02/2007  Job:  [id number]   cc:   Antony Contras, M.D.  Barnett Abu, M.D.

## 2010-10-01 NOTE — Consult Note (Signed)
NAMEWISDOM, MCNAMAR NO.:  [id number]   MEDICAL RECORD NO.:  [id number]          PATIENT TYPE:  INP   LOCATION:  3004                         FACILITY:  [facility]   PHYSICIAN:  Jill Alexanders, M.D.  DATE OF BIRTH:  04/05/1929   DATE OF CONSULTATION:  10/06/2006  DATE OF DISCHARGE:                                 CONSULTATION   HISTORY OF PRESENT ILLNESS:  [name] is a 77 year old, right-handed  white female born on 10-27-1928, with a history of atrial  fibrillation and hypertension, dyslipidemia.  The patient has recently  had a cardiac pacer placed in April2008.  Patient has been on  Coumadin and comes in with an INR of 2.6.  This patient had an event the  day of admission.  The patient was picking out flowers at the shop and  suddenly noted that she had a tendency to lean to the left.  The patient  was holding onto the shopping cart and was able to stay upright by doing  so.  The patient, otherwise, had no problems.  Had no headache.  No  visual field changes, numbness or weakness on the arms or legs, vertigo,  nausea.  The patient denied any double vision or loss of vision.  The  patient had no slurred speech.  The patient drove herself home, but  noted when she got out of the car, she had to hold onto things in order  to get into the house and to get about the house.  The patient called  her doctor recommended going to the emergency room.  The patient  underwent a CT scan of the head in the emergency room that was  unremarkable.  The patient was admitted for possible TIA versus stroke.  Over the ensuing 6 hours or so, the patient had normalization of her  symptoms.  The patient, at this point, feels completely normal.  Patient  has had a 2-D echocardiogram done on April14 that showed an ejection  fraction 60%, some mild mitral valvular regurgitation was seen.  Otherwise, the study was unremarkable.  The patient is being seen by  neurology at this  point for further evaluation.  NIH stroke scale is  zero.   PAST MEDICAL HISTORY:  1. History of transient episode of gait disturbance as above.  2. Paroxysmal atrial fibrillation.  3. Status post pacemaker placement.  4. Diabetes.  5. History of cataracts.  6. Dyslipidemia.  7. Hypertension.  8. Breast biopsy in the past.   CURRENT MEDICATIONS:  1. Coumadin.  2. Amiodarone 100 mg b.i.d.  3. Lipid 20 mg daily.  4. Cardizem 240 mg daily.  5. Sliding scale insulin.  6. Glucophage 1000 mg b.i.d.  7. Metoprolol 15 mg b.i.d.  8. Avapro 150 mg daily.   ALLERGIES:  TIKOSYN.   SOCIAL HISTORY:  Does not smoke, quit in February2008.  Does not  drink alcohol.   SOCIAL HISTORY:  This patient lives in the [place]  area, is married, has no children, is retired.   FAMILY MEDICAL:  History notable that  mother died with Salmonella  infection.  Had a history of strokes died her 52s.  Father died with  multiple strokes.  Patient has two sisters, one sister has significant  degenerative arthritis total knee replacement.  Other sister is in good  health.   REVIEW OF SYSTEMS:  Notable for no recent fevers, chills.  The patient  denies headache, neck pain.  Denies problems swallowing, slurred speech,  double vision.  Denies shortness of breath.  Has had some left shoulder  discomfort following this pacer placement.  Denies any abdominal pain,  nausea, vomiting, troubles controlling the bowels or bladder.  Denies  problems with dizziness, loss of consciousness.   PHYSICAL EXAMINATION:  VITALS:  Blood pressure is 95/57, heart rate 92,  respirations 20, temperature afebrile.  GENERAL:  The patient is a fairly well-developed white female who is  alert and cooperative at the time of the examination.  HEENT:  Head is atraumatic.  EYES:  Pupils are oblong and trace  reactive.  Disks are flat.  NECK:  Supple.  No carotid bruits noted.  RESPIRATORY:  Examination is clear.   CARDIOVASCULAR:  Examination reveals a regular rate and rhythm.  No  obvious murmurs or rubs noted.  EXTREMITIES:  Without significant edema.  NEUROLOGICAL:  Cranial nerves as above.  Facial symmetry is present.  Patient has good sensation of face to pinprick, soft touch bilaterally.  Has good strength in facial muscle, muscles to head turning, shoulder  shrug bilaterally.  Speech is well enunciated, not aphasic.  Motor  testing reveals 5/5 strength in all fours.  Good symmetric motor is  noted throughout.  Sensory testing is intact to pinprick, soft touch,  vibratory sensation throughout.  Position sense intact in all fours.  No  evidence of extinction is noted.  Patient has good finger-nose-finger  and heel-to-shin.  Gait is normal.  The patient has good tandem gait.  Romberg negative.  No drift is seen.  Deep tendon reflexes are symmetric  and normal.  Toes are downgoing bilaterally.  No upper extremity or  lower extremity drift is seen.  NIH stroke scale of zero.   LABORATORY VALUES:  Notable for white count of 10.7, hemoglobin of 12.1,  hematocrit 36.7, platelets of 240, MCV 94.6.  INR of 2.6, 2.9 on  admission.  Sodium 140, potassium 4.1, chloride of 102, CO2 28, glucose  95, BUN of 12, creatinine 0.8, calcium 9.4.   CT of the head is as above.   IMPRESSION:  1. Transient episode of gait disturbance with tendency to lean to the      left.  2. Atrial fibrillation.  3. Hypertension.  4. Dyslipidemia.  5. Status post pacer placement.   This patient has multiple risk factors for stroke.  The patient came in  on a supratherapeutic INR.  The patient had an event of transient left-  sided gait disturbance, likely representing a TIA event.  The patient is  at risk for cardioembolic stroke but need to rule out intrinsic  cerebrovascular disease as well.   PLAN:  1. Will get a CT angiogram of the intracranial and extracranial     vessels.  2. Repeat CT of the head at this point.   3. Consider addition of baby aspirin 81 mg daily.  Will follow the      patient's clinical course while in-house      C. Floyde Parkins, M.D.  Electronically Signed     CKW/MEDQ  D:  10/06/2006  T:  10/06/2006  Job:  [id number]   cc:   Antony Contras, M.D.  [address] [place], [place] [facility]

## 2010-10-01 NOTE — H&P (Signed)
[name], [name] NO.:  [id number]   MEDICAL RECORD NO.:  [id number]          PATIENT TYPE:  EMS   LOCATION:  MAJO                         FACILITY:  [facility]   PHYSICIAN:  Annita Brod, M.D.DATE OF BIRTH:  [birth year]   DATE OF ADMISSION:  05/28/2008  DATE OF DISCHARGE:                              HISTORY & PHYSICAL   PCP:  Antony Contras, M.D.   CARDIOLOGIST:  Barnett Abu, M.D.   CHIEF COMPLAINT:  Shortness of breath and weakness.   HISTORY OF PRESENT ILLNESS:  The patient is a 77 year old white female  with past medical history of diabetes mellitus and atrial fibrillation  who for the last several days has had problems with increasing shortness  of breath, nonproductive cough and generalized weakness.  She went to an  Urgent Care facility, diagnosed with a bronchitis versus early pneumonia  and put on p.o. antibiotics.  However, her condition continued to  worsen, not improve, and so she came into the emergency today.  In the  emergency room she was noted to have a heart rate of 105 and requiring 2  liters of oxygen for O2 sats of 92%.  An electrocardiogram noted atrial  fibrillation despite the fact that she actually has a history of a  pacemaker and labs were ordered on the patient and she was noted to have  a sugar of 272, a white count of 13 with 82% shift and a  supratherapeutic INR of 5.8.  Cardiac markers were normal, however a BNP  was noted to be at 701.  Chest x-ray showed hazy interstitial opacities  in the upper lung regions, edema versus atypical infection.  The patient  received both Lasix and Zithromax in the emergency room.  Currently, she  is feeling okay.  She complains of some mild shortness of breath.  She  denies any headaches, vision changes or dysphagia.  No acute chest pain  or palpitations.  She does complain of some shortness of breath, some  wheezing or coughing earlier, that has improved, no abdominal pain.  No  hematuria, dysuria, constipation, diarrhea, no focal extremity numbness,  weakness or pain, other than the fact that she also complains of some  underlying generalized back pain which she says came from a fall a few  days back.  Review of systems otherwise negative.   PAST MEDICAL HISTORY:  1. Diabetes mellitus.  2. Atrial fibrillation, status post pacemaker.  3. Depression.  4. Tobacco abuse.  5. Hyperlipidemia.   MEDICATIONS:  She is on:  1. Multaq 400 p.o. twice daily.  2. Metoprolol 100 p.o. twice daily.  3. Cardizem 180 p.o. daily.  4. Coumadin, she is unclear of the dose.  5. Metformin 1000 p.o. twice daily.  6. Micardis 20 mg p.o. daily.  7. Lexapro 20 p.o. daily.  8. Lipitor 10 p.o. daily.  9. Ativan 0.5 to 1 mg p.o. daily p.r.n.  10.Aspirin 81.  11.Multivitamin daily.  12.Ambien 5 nightly.  13.PRN Tums.   She has no known drug allergies.   SOCIAL HISTORY:  She smokes about 3-4 cigarettes  a day.  No heavy  alcohol or drug use.   FAMILY HISTORY:  Noncontributory.   PHYSICAL EXAMINATION:  VITALS ON ADMISSION:  Temperature 97.2, heart  rate 105, now down to 82, blood pressure 113/67, respirations 16, O2 sat  92% on 2 liters, 95% on 4 liters.  GENERAL:  She is alert and oriented x3, in no apparent distress.  HEENT:  Normocephalic, atraumatic and mucous membranes are moist.  She  has no carotid bruits.  Currently, she is actually at a normal sinus  rhythm with a 2/6 systolic ejection murmur.  LUNGS:  Essentially clear to auscultation bilaterally.  She has some  very minimal wheezing in the apices but not too impressive.  ABDOMEN:  Soft, nontender, nondistended, positive bowel sounds.  EXTREMITIES:  No clubbing, cyanosis or edema.   LAB WORK:  Chest x-ray is as per HPI.  Electrocardiogram shows atrial  fibrillation.  Sodium 132, potassium 3.8, chloride 98, bicarbonate 23,  BUN 15, creatinine 0.99, glucose 272.  White count 13, H&H 10 and 31,  MCV is 85, platelet  count 345, 82% shift, INR 5.8.  CPK 124, MB less  than 1, troponin-I less than 0.05, BNP 701.   ASSESSMENT/PLAN:  1. Pneumonia.  IV antibiotics, nebulizers and oxygen.  There is a      question mark here of how much of this is pneumonia versus some      underlying congestive heart failure.  I think that she has actually      more diastolic congestive heart failure.  Would plan to treat this      as a pneumonia with oxygen, nebulizers and antibiotics again.  In      regards to her elevated BNP, I suspect she may have some underlying      congestive heart failure which may be flared up slightly from      atrial fibrillation.  As far as I can tell, she has no previous      history other than atrial fibrillation in itself.  Will go ahead      and give her Lasix now, follow BNP and when the [facility]      office is open in the morning will go ahead and check records and      get a formal cardiology consult.  2. Diabetes mellitus.  Sliding scale insulin.  Especially with her      congestive heart failure, will hold her metformin for now and plan      to adjust accordingly.  3. Depression.  Continue Lexapro.  4. Elevated supratherapeutic INR.  This could be secondary to      diastolic dysfunction as noted by her congestive heart failure,      which could be leading to secondary hepatic congestion, and      therefore decreased clearance of her Coumadin.  Would plan to hold      her Coumadin for now and continue to follow PT/INR.      Annita Brod, M.D.  Electronically Signed    SKK/MEDQ  D:  05/28/2008  T:  05/28/2008  Job:  [id number]   cc:   Barnett Abu, M.D.  Antony Contras, M.D.

## 2010-10-01 NOTE — Discharge Summary (Signed)
[name], [name] NO.:  [id number]   MEDICAL RECORD NO.:  [id number]          PATIENT TYPE:  INP   LOCATION:  3004                         FACILITY:  [facility]   PHYSICIAN:  Carey Bullocks. Gertie Exon, MD     DATE OF BIRTH:  December 24, 1928   DATE OF ADMISSION:  10/05/2006  DATE OF DISCHARGE:  10/06/2006                               DISCHARGE SUMMARY   DISCHARGE DIAGNOSIS:  Transient ischemic attack.   DISCHARGE MEDICATIONS:  1. Aspirin 325 mg daily.  2. Amiodarone 200 mg daily.  3. Coumadin 1 mg daily.  4. Metformin 1,000 mg twice daily.  5. Crestor 10 mg daily.  6. Metoprolol 15 mg twice daily.  7. Xanax 0.5 mg as needed.  8. Benicar 20 mg daily.  9. Cardizem LA 240 mg daily.   SUMMARY OF HOSPITALIZATION:  [name] is a 77 year old female with a  past medical history notable for diabetes, hypertension, dyslipidemia,  and difficult to control paroxysmal atrial fibrillation.  She was  admitted to the hospital yesterday with a chief complaint of weakness on  the left side.  A CT scan of the head was negative for evidence of a  stroke.  She was admitted to the hospital for observation and Dr. Jannifer Franklin  from neurology was consulted.  By the following morning, the patient's  symptoms had resolved both subjectively and objectively.  A CT scan was  performed, which did not reveal evidence of an evolving stroke.  A CT  angiogram was performed as well.  This did not reveal significant  stenosis in the intracranial vessels, some stenosis was seen in the left  common carotid artery.  This was a preliminary report, but it appeared  that there was no more than 50% stenosis.  Additionally, carotid  Doppler's were performed and those were negative for a significant  stenosis.   ASSESSMENT AND PLAN:  This is a transient ischemic attack in a woman  with numerous risk factors for both atherosclerotic and thromboembolic  cerebral artery disease.  She will be discharged on her home  medications  as  well as aspirin 325 mg daily.  No followup has been arranged with Dr.  Jannifer Franklin and he should be contacted if any further questions about her  management arise.  I have instructed the patient to followup with Dr.  Moreen Fowler by early next week to review her medications and her clinical  status.      Carey Bullocks. Gertie Exon, MD  Electronically Signed     PML/MEDQ  D:  10/06/2006  T:  10/07/2006  Job:  [id number]   cc:   Antony Contras, M.D.  Barnett Abu, M.D.  Jill Alexanders, M.D.

## 2010-10-01 NOTE — Op Note (Signed)
[name], [name]                 ACCOUNT NO.:  [id number]   MEDICAL RECORD NO.:  [id number]          PATIENT TYPE:  OIB   LOCATION:  2899                         FACILITY:  [facility]   PHYSICIAN:  Barnett Abu, M.D.  DATE OF BIRTH:  1928/09/24   DATE OF PROCEDURE:  10/23/2006  DATE OF DISCHARGE:  10/23/2006                               OPERATIVE REPORT   PROCEDURE PERFORMED:  Direct current cardioversion.   INDICATION:  Persistent atrial fibrillation now six weeks status post  permanent transvenous pacemaker with a similar time period of amiodarone  therapy.  She has been systemically anticoagulated for the past six  weeks and had an INR today of 2.6.   PROCEDURE NOTE:  While monitoring heart rate, blood pressure, O2  saturation and ECG, and under the direct supervision of Dr. Tedra Senegal of the anesthesia apartment, she received 70 mg of IV Diprivan.  After establishing deep anesthesia, a single transthoracic dose of  biphasic energy synchronized 120 joules was applied with prompt return  of sinus rhythm in a sinus bradycardic fashion.  The rate was turned on  to 40 beats per minute and the patient had atrial sensing and  ventricular sensing at a rate of 55 beats per minute.   IMPRESSION:  Successful elective cardioversion of atrial fibrillation to  sinus rhythm.   PLAN:  1. Continue amiodarone 200 mg p.o. b.i.d. until seen in the office      within two weeks.  2. Continue warfarin therapy.  3. Check amiodarone level today.  4. Atrial therapies were turned on through the device as well as      atrial preferential pacing.      Barnett Abu, M.D.  Electronically Signed     JHE/MEDQ  D:  10/23/2006  T:  10/23/2006  Job:  [id number]

## 2010-10-01 NOTE — Op Note (Signed)
NAMEREGENE, [name]   MEDICAL RECORD NO.:  [id number]          PATIENT TYPE:  INP   LOCATION:  2006                         FACILITY:  [facility]   PHYSICIAN:  Sherri Mungo. Lovena Le, [name]    DATE OF BIRTH:  October 24, 1928   DATE OF PROCEDURE:  06/02/2008  DATE OF DISCHARGE:                               OPERATIVE REPORT   PROCEDURE PERFORMED:  Arteriovenous node ablation utilizing interatrial  pacing and mapping.   INTRODUCTION:  The patient is a 77 year old woman with a longstanding  history of atrial fibrillation, which has been difficult to control and  associated with rapid ventricular rates.  She was admitted to the  hospital after having episode of bronchitis and palpitations and  shortness of breath.  The patient has failed multiple antiarrhythmic  drugs.  She was most recently placed on Multaq.  This has not captured  any sinus rhythm.  She was found to be in atrial flutter/fib with rapid  ventricular response.  She is now referred for ablation.   PROCEDURE:  After informed consent was obtained, the patient was taken  to the Diagnostic EP Lab in the fasting state.  After usual preparation  and draping, intravenous fentanyl and midazolam was given for sedation.  A 7-French quadripolar ablation catheter was inserted percutaneously  into the right femoral vein and advanced to the right atrium.  The  ablation catheter was placed in the atrial flutter isthmus.  This  demonstrates irregular activation sequence with cycle lengths in flutter  between 240 and 270 milliseconds.  Rapid atrial pacing was carried out  from the atrial flutter isthmus at 210 milliseconds.  This demonstrated  a very long post pacing interval much greater than the atrial flutter  cycle length.  It also demonstrated transitioning in the atrial flutter  into a different atrial flutter both of which were in the left atrium.  Mapping was carried out demonstrating that the atrial flutter  activation  sequence was variable, also indicative of a complex left atrial for  surgery.  At this point, the ablation catheter was maneuvered into the  region of the AV node.  The H-V interval was 48 milliseconds.  Two RF  energy applications were subsequently delivered to the His-bundle region  resulting in creation of complete heart block.  The patient tolerated  the procedure well.  She was observed for 30 minutes with no recurrent  AV node conduction.  Her pacemaker which had been placed previously was  reprogrammed VVI at 90.  The catheters were removed.  She returned to  her room in satisfactory condition.   COMPLICATIONS:  There were no immediate procedure complications.   RESULTS:  Demonstrates successful AV node ablation after interatrial  pacing and mapping demonstrated clear evidence of left atrial flutters.      Sherri Mungo. Lovena Le, [name]  Electronically Signed     GWT/MEDQ  D:  06/02/2008  T:  06/02/2008  Job:  [id number]   cc:   Barnett Abu, M.D.

## 2010-10-01 NOTE — Discharge Summary (Signed)
Sherri, [name]                 ACCOUNT NO.:  [id number]   MEDICAL RECORD NO.:  [id number]          PATIENT TYPE:  INP   LOCATION:  88                         FACILITY:  [facility]   PHYSICIAN:  Corinna L. Conley Canal, MDDATE OF BIRTH:  September 13, 1928   DATE OF ADMISSION:  11/08/2006  DATE OF DISCHARGE:  11/13/2006                               DISCHARGE SUMMARY   DISCHARGE DIAGNOSES:  1. Digoxin toxicity  2. Diabetes.  3. Atrial fibrillation with pacemaker.  4. History of transient ischemic attack.   DISCHARGE MEDICATIONS:  1. Digoxin has been decreased to 0.125 mg a day.  2. She may take Claritin or Zyrtec as needed for sinus congestion or      rhinorrhea.  3. Afrin 2 sprays per nostril twice a day for 5 days.  4. She may continue the rest of her outpatient medications which      include amiodarone 200 mg a day.  5. Metoprolol 100 mg p.o. b.i.d.  6. Cardizem 360 mg a day.  7. Coumadin currently 1 mg alternating with 0.5 mg a day.  8. Metformin 1,000 mg twice a day.  9. Benicar 10 mg a day.  10.Crestor 10 mg a day.  11.Aspirin 81 mg a day.  12.Multivitamin a day.  13.Tums as needed.  14.Xanax as needed.   CONDITION:  Stable.   FOLLOWUP:  Dr. Leonia Reeves on November 26, 2006 at 1 p.m.  Follow up with the [facility] on November 16, 2006 at 11:30 a.m.   DIET:  Should be low-salt.   ACTIVITY:  Ad lib.   CONSULTATIONS:  1. Barnett Abu, M.D.  2. Champ Mungo. Lovena Le, M.D.   PERTINENT LABORATORY:  White blood cell count on admission was 15,000,  otherwise unremarkable CBC.  INR on admission is 3.1, at discharge it  was 2.3.  Basic metabolic panel on admission significant for a sodium of  132, glucose of 157, otherwise unremarkable.  Digoxin level 32.8 on November 08, 2006.  On November 09, 2006, it was 1.6.  Urinalysis showed 15 ketones,  negative protein, negative nitrite, small leukocyte esterase, 0-2 white  cells.  Urine culture showed diphtheroids.  Point-of-care enzymes  negative.   SPECIAL STUDIES AND RADIOLOGY:  EKG showed atrial fibrillation with left  axis deviation, flipped T-waves laterally.  EKG on June 26th, showed an  electronic ventricular pacemaker with P wave tracking.   HISTORY AND HOSPITAL COURSE:  [name] is a pleasant 77 year old white  female with a history of sick sinus syndrome, atrial fibrillation, and  pacemaker placement who presented to the emergency room with weakness,  generalized malaise, nausea, poor appetite.  Her heart rate was 55.  Otherwise, normal vital signs.  Slightly dry mucous membranes.  Irregular heart rhythm, otherwise unremarkable exam.  Her digoxin level  was high and she was admitted to telemetry for further workup.  Her  digoxin was held.  The patient's symptoms improved.  Cardiology was  consulted and an EP consult was recommended, please see dictations.  Dr.  Lovena Le recommended continuing amiodarone and the patient was started  back on  a smaller dose of digoxin.  There was no indication for  biventricular pacer or AV node ablation, according to Dr. Lovena Le.  The  patient had some abnormal pacing spikes and her pacemaker was  interrogated.  There were no problems found.  The patient also during her hospitalization complained of cough for a  few weeks and postnasal drip.  A repeat chest x-ray showed no pneumonia  but a left-sided small pleural effusion with atelectasis.  I have  recommended that she take an antihistamine as needed and Afrin as well,  and to follow up with Dr. Moreen Fowler if her sinus and cough symptoms do not  improve.  She has been afebrile while here and has no sinus pressure or  pain.   Total time on the day of discharge is 45 minutes.      Corinna L. Conley Canal, MD  Electronically Signed     CLS/MEDQ  D:  11/13/2006  T:  11/13/2006  Job:  [id number]   cc:   Antony Contras, M.D.  Champ Mungo. Lovena Le, MD  Barnett Abu, M.D.

## 2010-10-01 NOTE — Discharge Summary (Signed)
[name]                 ACCOUNT NO.:  [id number]   MEDICAL RECORD NO.:  [id number]          PATIENT TYPE:  INP   LOCATION:  4706                         FACILITY:  [facility]   PHYSICIAN:  Barnett Abu, M.D.  DATE OF BIRTH:  1928/08/21   DATE OF ADMISSION:  08/28/2006  DATE OF DISCHARGE:  09/04/2006                               DISCHARGE SUMMARY   DISCHARGE DIAGNOSES:  1. Sick sinus syndrome, status post chronic permanent pacemaker,      Medtronic type.  2. Paroxysmal atrial fibrillation.  3. Monitor on Coumadin therapy.  4. Diabetes mellitus.  5. Hypertension.  6. Hypercholesterolemia.  7. Long-term medication use.   [name] is a 77 year old female who was admitted to [facility] on August 28, 2006 with paroxysmal atrial fibrillation.  She  recently had been admitted to [facility] for Tikosyn loading.  Unfortunately, she presented back to [facility] in atrial  fibrillation with rapid ventricular response.  She was diagnosed with  tachy/brady syndrome and required a permanent pacemaker, Medtronic type,  dual chamber.   Before her pacemaker, we did have an ED consult, and again the  recommendation was for amiodarone in addition to the permanent  pacemaker.  By September 04, 2006, the patient was ready for discharge to  home.   DISCHARGE LABS:  White count 10.9, hemoglobin 13, hematocrit 38.9,  platelets 230, PT 28.4, INR 2.5, sodium 137, potassium 4.6, BUN 10,  creatinine 0.74, TSH 3.514.  CK-MB and troponin are negative.  BNP 254.   DISCHARGE MEDICATIONS:  1. Coumadin as prior to admission.  2. Metformin 1000 mg twice a day.  3. Crestor 10 mg a day.  4. Multivitamin daily.  5. Xanax 0.5 mg one-half tablet p.r.n.  6. Tums p.r.n.  7. Benicar 20 mg one-half tablet daily.  8. Lopressor 100 mg twice a day.  9. Amiodarone 200 mg three times a day or as directed.  10.Cardizem 240 mg a day.  11.Tylenol is okay to use for pain.   Remain on  the diabetic diet.  I gave her instructions regarding activity  and wound care on our initial sheet of devices/implantations.  Follow up  to see me on September 15, 2006, at 1:30 p.m.  She is to stop taking her  Tikosyn.      Joesphine Bare, P.A.      Barnett Abu, M.D.  Electronically Signed    LB/MEDQ  D:  11/03/2006  T:  11/03/2006  Job:  [id number]   cc:   Barnett Abu, M.D.

## 2010-10-01 NOTE — Consult Note (Signed)
[name], [name]                 ACCOUNT NO.:  [id number]   MEDICAL RECORD NO.:  [id number]          PATIENT TYPE:  INP   LOCATION:  4728                         FACILITY:  [facility]   PHYSICIAN:  Champ Mungo. Lovena Le, MD    DATE OF BIRTH:  08/22/1928   DATE OF CONSULTATION:  11/10/2006  DATE OF DISCHARGE:                                 CONSULTATION   REFERRING PHYSICIAN:  Barnett Abu, M.D.   INDICATION FOR CONSULTATION:  Evaluation and make additional  recommendations in the treatment of atrial fibrillation.   HISTORY OF PRESENT ILLNESS:  The patient is a 77 year old woman whom I  met several months ago, having been referred at that time to Dr.  Leonia Reeves.  At that time I had recommended, because of her underlying  bradycardia and atrial fibrillation, that she undergo implantation of a  pacemaker and initiation of amiodarone therapy which was carried out by  Dr. Leonia Reeves back in April of this year.  Since then, she was placed on  amiodarone and underwent DC cardioversion approximately 1 month ago.  Unfortunately, the patient had early return of atrial fibrillation and  was admitted with fatigue and weakness.  She was noted be in atrial  fibrillation with a fairly rapid ventricular response, questionably with  a controlled ventricular response.  She also complained of fatigue and  nausea.  The patient has been hospitalized, and her dose amiodarone has  been left at 200 mg daily.  The patient subsequently has now returned to  sinus rhythm.  This is demonstrated by her underlying atrial pacing with  ventricular sensing.   PAST MEDICAL HISTORY:  Is notable for:  1. Diabetes.  2. Osteoporosis.  3. Hypertension.  4. Chronic Coumadin therapy.  5. She has preserved LV function with no significant heart failure      symptoms.  6. She may have had some digoxin toxicity has a digoxin level was in      fact increased as she was on digoxin.  Her level was 2.8.   FAMILY HISTORY:  Notable for  hypertension.   SOCIAL HISTORY:  The patient is married, lives with husband.  She denies  tobacco or ethanol use.   REVIEW OF SYSTEMS:  Is as noted in the HPI.  She does have fairly severe  nausea, fatigue and weakness.  In the setting of an elevated digoxin,  this makes me wonder if she has some digoxin toxicity.   PHYSICAL EXAMINATION:  GENERAL:  Notable for a pleasant, elderly-  appearing woman in no acute distress.  VITAL SIGNS:  The blood pressure was 100/60.  The pulse was 60 and  regular, respirations 18.  The temperature is 90.  HEENT:  Normocephalic and atraumatic.  Pupils are round.  Oropharynx  moist.  Sclerae anicteric.  NECK:  Revealed no jugular distension.  There is no thyromegaly.  Trachea is midline.  Carotid 2+ and symmetric.  LUNGS:  Clear bilaterally to auscultation.  No wheezes, rales or rhonchi  were present.  There was no increased work of breathing.  CARDIOVASCULAR:  Regular rate and rhythm  with normal S1-S2.  There are  no obvious murmurs, rubs or gallops.  The PMI was not enlarged nor was  it laterally displaced.  ABDOMINAL:  Soft, nontender, nondistended.  No organomegaly.  Bowel  sounds present.  No rebound or guarding.  EXTREMITIES:  Demonstrate no cyanosis, clubbing or edema.  The pulses  were 2+ and symmetric.  NEUROLOGIC:  Alert and oriented x3.  Cranial nerves intact.  Strength  was 4+/5 and symmetric.  She has very soft, minimal tremor.   Her initial EKG demonstrates atrial fibrillation.   Present telemetry strips demonstrate atrial pacing.   IMPRESSION:  1. Persistent atrial fibrillation.  2. Fatigue and weakness.  3. Probable digoxin toxicity.  4. Diabetes.  5. Hypertension.   DISCUSSION:  The patient has spontaneously reverted back to sinus rhythm  on amiodarone.  I suspect that her early return of atrial fibrillation a  month ago and simply that she had not received enough amiodarone. At  this point because she has maintained sinus  rhythm, I would not  recommend BiV pacemaker upgrade nor would I recommend AV node ablation.  She may ultimately end up back in atrial fibrillation, but hopefully she  will maintain sinus rhythm, and will continue to watch and see how she  does.  I expect her nausea to resolve once a digoxin level is  normalized.      Champ Mungo. Lovena Le, MD  Electronically Signed     GWT/MEDQ  D:  11/10/2006  T:  11/11/2006  Job:  [id number]   cc:   Barnett Abu, M.D.

## 2010-10-01 NOTE — Consult Note (Signed)
[name], [name] NO.:  [id number]   MEDICAL RECORD NO.:  [id number]          PATIENT TYPE:  INP   LOCATION:  2006                         FACILITY:  [facility]   PHYSICIAN:  Champ Mungo. Lovena Le, MD    DATE OF BIRTH:  August 12, 1928   DATE OF CONSULTATION:  05/31/2008  DATE OF DISCHARGE:                                 CONSULTATION   REQUESTING PHYSICIAN:  Barnett Abu, MD   INDICATION FOR CONSULTATION:  To make recommendations regarding  treatment of atrial fibrillation and flutter.   HISTORY OF PRESENT ILLNESS:  The patient is 77 year old.  She has a  longstanding history of atrial fibrillation as well as atrial flutter.  She has a history of tachy-brady syndrome and underwent permanent  pacemaker insertion in the past.  She has longstanding diabetes and a  remote history of tobacco abuse.  The patient was admitted to the  hospital with bronchitis and increasing shortness of breath with BNP  that was over 700.  She also had an elevated white count in the leftward  shift, all consistent with bronchitis, plus or minus pneumonia, and  congestive heart failure.  The patient does have preserved LV systolic  function.  Her antiarrhythmic drug therapy history is quite long.  She  had been tried on sotalol, Rythmol, amiodarone, flecainide, Tikosyn, and  most recently Multaq.  She had maintained sinus rhythm on amiodarone,  but required higher doses than could be tolerated by the patient.  Higher doses of amiodarone made the patient weak.  She is referred now  for additional evaluation.  The patient has required intravenous  Cardizem to control ventricular rate.   PAST MEDICAL HISTORY:  Also notable for depression and dyslipidemia.  She takes Lexapro for depression.   SOCIAL HISTORY:  The patient continues to smoke three to four cigarettes  per day.  She denies alcohol abuse.   FAMILY HISTORY:  Noncontributory.   REVIEW OF SYSTEMS:  Shortness of breath with exertion.   She has  palpitations, which are very bothersome to her.  She has dyspnea on  exertion.   PHYSICAL EXAMINATION:  GENERAL:  She is a pleasant elderly woman in no  acute distress.  VITAL SIGNS:  Blood pressure today was 102/78, the pulse was 100 and  irregular, respirations were 20-22.  HEENT:  Normocephalic and atraumatic.  Pupils equal and round.  Oropharynx is moist.  Sclerae anicteric.  NECK:  A 7-cm jugular venous distention.  There is no thyromegaly.  Trachea is midline.  Carotid are 2+ and symmetric.  LUNGS:  Clear bilaterally to auscultation except for rales in the bases  bilaterally.  No wheezes or rhonchi appreciated today.  There is no  increased work of breathing.  CARDIOVASCULAR:  Irregularly irregular tachycardia with normal S1 and  S2.  PMI was not particularly enlarged or displaced.  ABDOMEN:  Soft, nontender, and nondistended.  There is no organomegaly.  Bowel sounds are present.  There is no rebound or guarding.  EXTREMITIES:  No cyanosis, clubbing, or edema.  Pulses were 2+ and  symmetric.  NEUROLOGIC:  Alert and oriented x3.  Cranial nerves are intact.  Strength is 5/5 and symmetric.   The EKG demonstrates atrial fibrillation as well as atrial flutter with  a rapid ventricular response.   IMPRESSION:  1. Symptomatic atrial fibrillation and flutter with a rapid      ventricular response.  2. Diastolic heart failure secondary to symptomatic atrial      fibrillation and flutter with a rapid ventricular response.  3. Acute bronchitis.  4. Status post pacemaker insertion.   DISCUSSION:  The patient's atrial arrhythmias have been very symptomatic  and very difficult to control.  The patient has failed multiple  antiarrhythmic drug therapies and despite medicines for rate control,  continues to have rapid ventricular rates.  To this end, I have  recommended one of two possible options.  The first option which I would  not recommend would be to consider AFib/left  atrial flutter ablation.  With the patient's advanced age and other comorbidities, I think the  likelihood of long-term success with an ablation procedure at this point  would be quite small and for this reason I would recommend not  proceeding with AFib/flutter ablation.  Ablation of the left atrial  flutter is very difficult.  As the patient has failed rate controlling  drugs as well, I would recommend AV node modifications/ablation for  permanent control of her atrial arrhythmias.  This would of course leave  her atrium at a rhythm but would prevent her ventricular rate from being  so fast.  Ultimately, she may require upgrade to a biventricular  pacemaker having accomplished this, but I would not initially do this  unless the patient became symptomatic.  I will plan to review the  patient's histograms to see what her actual ventricular rates have been  looking like over the last several months and I will see her back and  make additional recommendations whether to proceed with AV node  ablation/modification.      Champ Mungo. Lovena Le, MD  Electronically Signed     GWT/MEDQ  D:  05/31/2008  T:  06/01/2008  Job:  [id number]

## 2010-10-01 NOTE — Discharge Summary (Signed)
NAMEEVALYSE, TACKITT NO.:  [id number]   MEDICAL RECORD NO.:  [id number]          PATIENT TYPE:  INP   LOCATION:  2006                         FACILITY:  [facility]   PHYSICIAN:  Irine Seal, MD    DATE OF BIRTH:  16-Feb-1929   DATE OF ADMISSION:  05/28/2008  DATE OF DISCHARGE:  06/03/2008                               DISCHARGE SUMMARY   PRIMARY CARE PHYSICIAN:  Antony Contras, M.D., of [facility].   DISCHARGE DIAGNOSES:  1. Community-acquired pneumonia.  2. Diastolic heart failure.  3. Atrial fibrillation/atrial flutter, history of permanent pacemaker.  4. Depression.  5. Chronic obstructive pulmonary disease.  6. Type 2 diabetes.  7. Anemia.  8. Hypokalemia.  9. Anxiety.  10.History of tobacco abuse.  11.Hyperlipidemia.   DISCHARGE MEDICATIONS:  1. Coumadin 2 mg on Monday, Wednesdays and Fridays and 1 mg on      Tuesdays, Thursdays, Saturdays and Sundays.  2. Mucinex 600 mg 1 tablet p.o. b.i.d. x4 days.  3. Metformin 1 g p.o. b.i.d.  4. Micardis 20 mg p.o. daily.  5. Lexapro 20 mg p.o. daily.  6. Lipitor 10 mg p.o. daily.  7. Ativan 0.5 to 1 mg p.o. p.r.n.  8. Aspirin 81 mg p.o. daily.  9. Multivitamin 1 tablet p.o. daily.  10.Ambien 5 mg p.o. q.h.s. p.r.n.   DISPOSITION AND FOLLOWUP:  The patient will be discharged home.  The  patient is to follow up in 1 week at the Coumadin clinic to get the INR  checked.  The patient is also to follow up with Dr. Leonia Reeves in 2 weeks.  The patient will need to follow up with his PCP in 2 weeks for followup  on his pneumonia.  On followup, a CBC will need to be checked to follow  up on the patient's hemoglobin and white count.  The patient will need a  BMET checked to follow up on the electrolytes and renal function.  The  patient will need her anemia reassessed and further workup done as an  outpatient.  The patient has not had a colonoscopy done, will likely  need a screening colonoscopy done.   CONSULTATIONS DONE:  1. A cardiology consult was done.  The patient was seen in      consultation by Dr. Leonia Reeves on May 29, 2008.  2. The patient was also seen in consultation by Dr. Cristopher Peru of      electrophysiology with [facility] on May 31, 2008.   PROCEDURES PERFORMED:  1. AV node ablation utilizing intra-arterial pacing and mapping was      done on June 02, 2008, by Dr. Cristopher Peru.  2. A pacemaker interrogation was done on June 03, 2008.  3. A chest x-ray was done on May 28, 2008, that showed hazy      interstitial opacities in the upper lung regions.  Findings may      represent edema or atypical infection.  Difficult to exclude tiny      pleural effusions; however, this probably represents pleural      thickening based on previous studies.  4. Chest x-ray done on May 31, 2008, did show interstitial      opacities on previous study, appears slightly less prominent on the      current study.  There is generalized hyperinflation configuration,      small pleural effusions appear to have increased slightly posterior      on the lateral margin.  5. Chest x-ray done on June 02, 2008, showed no acute findings.   BRIEF ADMISSION HISTORY AND PHYSICAL:  [name] is a 77 year old  white female, past medical history of diabetes, atrial fibrillation, who  for the past several days has had had problems with increasing shortness  of breath, nonproductive cough and generalized weakness.  The patient  went to an urgent care facility, was diagnosed with bronchitis versus  early pneumonia, put on oral antibiotics, but her condition continued to  worsen and not improve and she presented to the ED.  In the ED she was  noted to have a heart rate of 105, requiring 2 L of oxygen for O2  saturations of 92%.  EKG noted atrial fibrillation despite the fact that  she actually has a history of a pacemaker.  Labs were ordered on the  patient.  She was noted to  have a CBG of 272 and a white count of 13  with an 82% shift and a supratherapeutic INR of 5.8.  Cardiac markers  obtained were normal; however, BNP was noted to be 701.  Chest x-ray  showed hazy interstitial opacities in the upper lung regions, edema  versus atypical infection.  The patient had received Lasix and Zithromax  in the ED.  The patient was feeling okay at the time of the interview.  The patient, though, was complaining of some mild shortness of breath.  Denied any headaches, no visual changes or dysphagia.  No acute chest  pain or palpitations.  The patient did complain of some shortness of  breath, some wheezing or coughing which had improved.  No abdominal  pain.  No hematuria, dysuria, constipation, diarrhea or focal extremity  numbness, weakness or pain other than the fact that she also complained  of some underlying generalized back pain which she had stated came from  a fall a few days prior to admission.  Review of systems was otherwise  negative.   PHYSICAL EXAMINATION PER ADMITTING PHYSICIAN:  Temperature 97.2, heart  rate of 105, down to 82, blood pressure 113/67, respirations 16, oxygen  92% on 2 L, 95% on 4 L.  GENERAL: The patient was alert and oriented x3, in no apparent distress.  HEENT:  Normocephalic, atraumatic.  Moist mucous membranes.  No carotid  bruits.  CARDIOVASCULAR:  Regular rate, regular rhythm, 2/6 systolic ejection  murmur.  RESPIRATORY:  Lungs were clear to auscultation bilaterally with minimal  wheezing in the apices but not too impressive.  ABDOMEN:  Soft, nontender, nondistended, positive bowel sounds.  EXTREMITIES:  No clubbing, cyanosis or edema.   LABS ON ADMISSION:  Chest x-ray as stated above.  EKG showing atrial  fibrillation.  Sodium of 132, potassium 3.8, chloride 98, bicarb 23, BUN  15, creatinine 0.99, glucose of 272.  White count of 13, H and H of 10  and 31, MCV of 85, platelet count of 345, 82% shift.  INR of 5.8.  CPK  of  124, MB less than 1, troponin-I less than 0.05.  BNP of 701.   HOSPITAL COURSE:  1. Community-acquired pneumonia.  It was noted that  the patient per      chest x-ray might have had a community-acquired pneumonia.  The      patient had an elevated white count with a left shift.  The patient      was placed on oxygen, initially on IV Avelox and a nebulizer      treatment.  The patient was monitored at the time and she was also      placed on some Mucinex for a cough.  The patient remained afebrile      during the hospitalization with a trending down of her white count.      The patient's Avelox was then changed to IV Rocephin and      azithromycin such that Multaq could be used to help with the      patient's AFib with RVR.  The patient was maintained on IV Rocephin      and azithromycin and completed a 7-day course of antibiotics prior      to discharge.  By day of discharge, the patient was in stable and      improved condition and will follow up with PCP as an outpatient.  2. Diastolic heart failure.  On admission the patient was noted to      likely be in heart failure.  The patient was placed on strict Is      and O's, daily weights.  Cardiac enzymes were cycled, which came      back negative.  TSH was obtained, which was within normal limits.      The patient was diuresed with IV Lasix, which was subsequently      changed to oral Lasix.  The patient was also placed on a beta      blocker.  Cardiology was consulted.  The patient was followed per      cardiology throughout the hospitalization.  The patient's diastolic      heart failure was well-compensated.  The patient's Lasix was then      changed to p.o.  The patient remained hemodynamically stable and      euvolemic and by day of discharge it was felt the patient could be      discharged home without any oral diuretics per her cardiologist.      The patient will follow up with cardiology as an outpatient.  3. Atrial  fibrillation/atrial flutter with a history of permanent      pacemaker.  On admission the patient was noted to be AFib.  During      the hospitalization the patient's heart rate increased to the      150's.  The patient was initially placed on IV Cardizem as well as      a beta-blocker.  The patient's IV Cardizem was then changed to oral      Cardizem.  The patient's heart rate was still uncontrolled.  The      patient was then placed on Multaq after her Avelox was switched to      Rocephin and azithromycin.  The patient was maintained on Multaq      with still a rapid rate.  Amiodarone was added to the regimen and      the patient still remained in A-flutter.  The patient was followed      during the hospitalization per her cardiologist, Dr. Leonia Reeves, who      had then consulted with the electrophysiologist, Dr. Cristopher Peru      of [facility].  The  patient was seen in consultation by Dr.      Cristopher Peru on May 31, 2008.  It was felt that the patient      would benefit from an AV nodal ablation, which was performed by Dr.      Cristopher Peru on June 02, 2008.  A postprocedure pacemaker      interrogation was done on June 03, 2008.  The patient remained      stable after this AV nodal ablation with a rate of 80.  The patient      was maintained on her home dose of Coumadin.  Her INR was within      goal of 2 to 3 and the patient remained stable and was in improved      condition.  By day of discharge it was felt that the patient was      stable from a cardiac standpoint to be discharged home with close      follow up with Dr. Leonia Reeves.  The patient will on discharge get her      INR checked within a week and follow up with Dr. Leonia Reeves in 2 weeks      post discharge.  4. Depression was stable.  The patient was maintained on her home dose      of a Lexapro during the hospitalization.  5. COPD was stable.  The patient was maintained on oxygen and      nebulizer treatments.   The patient was also on IV antibiotics      secondary to problem #1.  The patient's COPD remained in stable      condition throughout the hospitalization.  6. Anemia.  During the hospitalization it was noted that the patient      was anemic.  The patient did not have any gross GI hemorrhage.  The      patient's H and H was followed, which remained stable during the      hospitalization.  An anemia panel was obtained during the      hospitalization and the patient had an iron level of 30, a TIBC of      318, a B12 level of 1331, folate greater than 20, and a ferritin of      91.  The patient remained stable.  It was felt that this can be      followed up on an outpatient basis.   On day of discharge, vital signs:  Temperature 98.0, pulse of 94, blood  pressure 115/75, respiratory rate 18, saturating 93% on room air.   Discharge labs:  Sodium 135, potassium 4.8, chloride 98, bicarb 31, BUN  16, creatinine 1.10, glucose of 97, calcium of 8.9.  CBC:  White count  10.4, hemoglobin 9.8, platelets of 526, hematocrit of 30.8, ANC of 7.4.  Iron of 30, TIBC of 318, folate of greater than 20, ferritin of 91.   The rest of the patient's chronic medical issues were stable throughout  the hospitalization and the patient will be discharged in stable and  improved condition.  It was a pleasure taking care of Ms. Freddrick March.      Irine Seal, MD  Electronically Signed     DT/MEDQ  D:  06/03/2008  T:  06/03/2008  Job:  [id number]   cc:   Antony Contras, M.D.  Barnett Abu, M.D.  Champ Mungo. Lovena Le, MD

## 2010-10-01 NOTE — H&P (Signed)
[name], [name]                 ACCOUNT NO.:  [id number]   MEDICAL RECORD NO.:  [id number]          PATIENT TYPE:  INP   LOCATION:  [id number]                         FACILITY:  [facility]   PHYSICIAN:  Bonnell Public, MDDATE OF BIRTH:  24-Dec-1928   DATE OF ADMISSION:  12/31/2006  DATE OF DISCHARGE:                              HISTORY & PHYSICAL   PRIMARY CARE PHYSICIAN:  Antony Contras, M.D.   CHIEF COMPLAINT:  Weakness and dehydration.   HISTORY OF PRESENT ILLNESS:  The patient is a 77 year old female with  past medical history significant for atrial fibrillation, status post  pacemaker placement, diabetes and dyslipidemia.  Currently, the patient  is not on any medication for dyslipidemia due to abnormal liver function  tests.  The patient was diagnosed with UTI by the cardiologist about a  week ago and started on Bactrim.  Following antibiotic ingestion, the  patient developed diarrhea and had to stop the Bactrim. The patient was  started on p.o. Flagyl.  She continues to have nausea, vomiting and  diarrhea.  There is associated dysuria.  She was seen at the [facility] and felt that she was very dehydrated and needed to come into the  hospital for further evaluation.  On presentation to the ER, the patient  was found to be significantly dehydrated.  Creatinine had gone up to  2.18 (the patient is said to have normal renal function at baseline) and  the UA continues to show positive nitrite and moderate leukocyte  esterase.  No headache or neck pain.  No chest pain or shortness of  breath.  No abdominal pain or dysuria and no joint pains.  No  significant weight loss.   PAST MEDICAL HISTORY:  Essentially as above.   ALLERGIES:  No known drug allergies.   MEDICATIONS PRIOR TO ADMISSION:  Amiodarone, Cardizem, Coumadin,  metformin, metoprolol, multivitamin, metranidazole, promethazine,  aspirin, paroxetine.   SOCIAL HISTORY:  The patient is married with no children.   Denied any  history of alcohol use, cigarette or illicit drug use.   FAMILY HISTORY:  Significant for CVA in the patient's dad.   REVIEW OF SYSTEMS:  Essentially as above.   PHYSICAL EXAMINATION:  VITAL SIGNS:  Temperature 97.7, blood pressure  118/71, heart rate 73, respirations 18.  GENERAL:  The patient is not in any acute distress, but remains very  anxious.  HEENT:  No pallor, no jaundice, extraocular movements intact.  The  tongue is very dry.  NECK:  Supple, no lymphadenopathy or JVD.  LUNGS:  Clear to auscultation.  CARDIAC:  S1, S2, regular with no heart murmur appreciated.  ABDOMEN:  Obese, soft, nontender.  Organs are not palpable.  NEUROLOGIC:  Nonfocal.  EXTREMITIES:  No edema, however, the patient has a macular rash with a  central clearing which might be secondary to fungi or secondary to drugs  (Bactrim).   LABORATORY DATA AND X-RAY FINDINGS:  White count 10.1, hemoglobin 12.2,  hematocrit 33.4, MCV 89.8, platelet count 234.  Sodium 134, potassium  3.9, chloride 96, CO2 27, glucose 190, BUN 22,  creatinine 2.18.  Calcium  is 9.3.  Albumin 3.7.  AST 78.  UA reveals 59 mg/dl ketones, 100 mg/dl  of glucose, specific gravity of 1.031, positive nitrite and moderate  leukocyte esterase.   IMPRESSION:  1. Urinary tract infection.  2. Dehydration.  3. Acute renal failure (may be a combination of dehydration as well as      Bactrim).  4. History of diabetes and is on metformin.  5. Nausea and vomiting.  6. Diarrhea.  7. History of atrial fibrillation.  8. Weakness and fatigue.  9. Diabetes mellitus.  10.Skin rash on the dorsum of right foot.   PLAN:  Will admit the patient to a telemetry bed.  Will culture the  patient's urine.  Will get a renal ultrasound as well as check urine.  Will get a fasting lipid profile.  We will start the patient on IV  imipenem while I await the urine and blood cultures.  Will continue  Flagyl.  Will use clotrimazole cream for the skin  rash.  Will continue  the patient's home medications.  Will adjust coumadin dose accordingly.      Bonnell Public, MD  Electronically Signed     SIO/MEDQ  D:  12/31/2006  T:  01/01/2007  Job:  [id number]   cc:   Antony Contras, M.D.

## 2010-10-01 NOTE — Consult Note (Signed)
NAMEGIDGET, SCHOEFFLER                 ACCOUNT NO.:  [id number]   MEDICAL RECORD NO.:  [id number]           PATIENT TYPE:   LOCATION:                                 FACILITY:   PHYSICIAN:  Barnett Abu, M.D.  DATE OF BIRTH:  [birth year]   DATE OF CONSULTATION:  05/29/2008  DATE OF DISCHARGE:                                 CONSULTATION   REASON FOR CONSULTATION:  Atrial fibrillation, rapid ventricular  response.   HISTORY OF PRESENT ILLNESS:  [name] is a pleasant 77 year old  woman, well known to my service, who has had a cough for approximately 1  week and was seen at Urgent Care, placed on antibiotics for  approximately 5 days prior to admission.  She has overall had decreased  energy but no fever or chills.  She has had worsening dyspnea and  finally presented to the emergency room on May 29, 2008, and  subsequently was admitted.  Her BNP was noted to be elevated at that  time around 700, and she was in atrial fibrillation with a rate of 100  beats per minute.  She is known to have paroxysmal atrial fibrillation,  has been treated with amiodarone in the past, and more recently with  Multaq.  This has been since the first of 2010.  She feels that the  Multaq has done a better job at controlling her tachy palpitation.   The cough has been nonproductive.  She has had no lower extremity edema.  No noticeable weight gain.  No chest discomfort.   PAST MEDICAL HISTORY:  1. Paroxysmal atrial fibrillation with tachybrady syndrome.  2. Status post PTVP placed approximately 2 years ago.  3. Diabetes mellitus.  4. Depression.  5. Tobacco abuse.  6. COPD.  7. Hyperlipidemia.   CURRENT MEDICATIONS:  1. Lexapro 20 mg p.o. daily.  2. Multaq 400 mg p.o. b.i.d.  3. Metoprolol 100 mg p.o. b.i.d.  4. Diltiazem LA 180 mg p.o. daily.  5. Metformin 1000 mg p.o. b.i.d.  6. Micardis 20 mg p.o. daily.  7. Lipitor 10 mg p.o. daily.  8. Ativan 0.5-1 mg p.o. b.i.d. p.r.n.  9. Aspirin 81  mg p.o. daily.  10.Warfarin, adjusted dose, maintain PT/INR of 2-3.   DRUG ALLERGIES:  None known.   SOCIAL HISTORY:  She continues to smoke about 3-4 cigarettes a day.  No  alcohol use.  She is retired.   FAMILY HISTORY:  Noncontributory.   REVIEW OF SYSTEMS:  She denies any fever, chills, or sweats.  She has  had increasing fatigue.  Denies any headache, epistaxis, vertigo,  photophobia.  No skin rash or lesions.  No PND or orthopnea.  Her  husband has noticed some wheezing recently at night.  No polyuria or  polydipsia.  No heat or cold intolerance.  No urinary symptoms.  No  excessive joint swelling, pain, myalgia, or arthralgia.  No nausea,  vomiting, or diarrhea.  No melena.  No hematochezia.  She does have  symptoms intermittently of reflux and no abdominal pain.  All other  systems are negative as well.  PHYSICAL EXAMINATION:  VITAL SIGNS:  Blood pressure is 119/67, pulse is  92 and irregularly irregular, respirations are 18, temperature 97.7, O2  saturation on 4 liters 97%, weight is 43 kg.  GENERAL:  She appears a pleasant thin Caucasian woman, 77 years of age,  in no distress.  HEENT:  Unremarkable.  Head is atraumatic and normocephalic.  The pupils  are equal, round, and reactive to light.  The sclerae are anicteric.  The oral mucosa is pink and moist.  Teeth and gums are in good repair.  Tongue is not coated.  NECK:  Supple without thyromegaly or masses.  The carotid upstrokes are  normal without bruit.  There is no JVD.  CHEST:  Diminished breath sounds bilaterally.  No wheezes, rales, or  rhonchi.  HEART:  Irregular rhythm.  Normal S1 and S2.  No murmur, click, or rub.  ABDOMEN:  Soft, scaphoid, and nontender.  No hepatosplenomegaly.  No  midline pulsatile mass.  EXTERNAL GENITALIA:  Atrophic.  RECTAL:  Not performed.  EXTREMITIES:  Full range of motion.  No edema and intact distal pulses.  MUSCULOSKELETAL:  Without joint deformity.  No spinal tenderness.  No   muscle tenderness.  NEUROLOGIC:  Cranial nerves II-XII are intact.  Motor and sensory as  well as gait are grossly intact.   Chest x-ray shows hazy interstitial opacities in the upper lobe lung  regions bilaterally, consistent with edema versus infection.  My review  appears to be more in the way of edema and cephalization of flow.  Electrocardiogram shows atrial flutter with an atrial rate of  approximately 260 beats per minute and an intermittently controlled  ventricular response.  QTC was 510 milliseconds on admission.  Laboratory assessment showed a white blood cell count of 11,100 and  hemoglobin of 9.6.  Normal serum electrolytes, BUN of 9, creatinine of  0.8, blood sugar of 124.  Point-of-care enzymes and CK-MB troponin on  the floor have been negative x2.  INR was 5.4 on admission.  Hemoglobin  A1c 7.1.   ASSESSMENT:  1. Atrial fibrillation, paroxysmal, currently with intermittent to      poor ventricular response control.  2. History of permanent pacemaker insertion, this is a sophisticated      Medtronic EnRhythm that can monitor as well as treat the atrial      arrhythmias with atrial preferential pacing and atrial pacing      algorithms to suppress the arrhythmia.  3. The patient does appear to be in heart failure, likely acute      diastolic.  Last left ventricular ejection fraction assessment by a      2-D echocardiogram in April 2009 was 60%.  4. Diabetes mellitus.  5. Hyperlipidemia.  6. Persistent tobacco abuse.  7. Chronic obstructive pulmonary disease.  8. History of depression.   PLAN:  1. Recommend continue attempts at control of ventricular response with      p.o. diltiazem and metoprolol.  IV diltiazem as necessary.  2. We will hold Multaq while the patient is being treated with Avelox      for a possible pneumonia.  3. Continue diuresis as you have initiated.  The patient has had over      2 liters of urine output today.  4. Daily weights and strict  input and output.  5. Daily BNP and electrolyte assessment.  6. Interrogate pacemaker for assessment of atrial fibrillation burden.  7. Consider atrial flutter ablation as this has been a chronic  problem, very difficult to control with Ms. Robicheaux.   Thank you very much for allowing me to assist in the care of Freddrick March.  It has been a pleasure to do so.  I will discuss her further  care with you.      Barnett Abu, M.D.  Electronically Signed     JHE/MEDQ  D:  05/31/2008  T:  05/31/2008  Job:  [id number]   cc:   Antony Contras, M.D.

## 2010-10-01 NOTE — H&P (Signed)
[name], [name]                 ACCOUNT NO.:  [id number]   MEDICAL RECORD NO.:  [id number]          PATIENT TYPE:  INP   LOCATION:  1844                         FACILITY:  [facility]   PHYSICIAN:  Carey Bullocks. Gertie Exon, MD     DATE OF BIRTH:  [birth year]   DATE OF ADMISSION:  10/05/2006  DATE OF DISCHARGE:                              HISTORY & PHYSICAL   CHIEF COMPLAINT:  Weakness on the left side.   HISTORY OF PRESENT ILLNESS:  The patient is a 77 year old female who was  well until today when she developed difficulty walking.  She was walking  around and felt herself falling and leaning to the left.  She was able  to drive to her physician's office who referred her on to the hospital  for further evaluation.  Over the course of the subsequent few hours  during her evaluation, her symptoms markedly improved, but at the time  of my evaluation approximately 8 hours later, she still had a small  degree of weakness on this side.   REVIEW OF SYSTEMS:  Negative for gastrointestinal, respiratory or  constitutional symptoms.  She denied speech problems, headache or  difficulty with her vision.  She is right handed.   PAST MEDICAL HISTORY:  1. Paroxysmal atrial fibrillation which has been difficult to control.      Status post pacemaker placement in April 2008.  2. Diabetes mellitus.  3. Cataracts.  4. Dyslipidemia.  5. Hypertension.  6. Systemic anticoagulation on Coumadin therapy.   CURRENT MEDICATIONS:  1. Amiodarone, dose to be provided by the patient's husband.  2. Coumadin 1 mg daily.  3. Metformin 1000 mg twice daily.  4. Crestor 10 mg daily.  5. Metoprolol 50 mg twice daily.  6. Xanax 0.5 mg as needed.  7. Benicar 20 mg daily.  8. Cardizem LA 240 mg daily.   ALLERGIES:  NONE.   SOCIAL HISTORY:  Remote smoking.  No alcohol or drug use.   VITAL SIGNS:  Temperature 98, heart rate 80, blood pressure 126/85,  respiratory rate 20, oxygen saturation 98% on room air.  GENERAL  APPEARANCE:  The patient was alert, very pleasant, elderly  female in no acute distress.  HEENT:  Mucous membranes are moist.  Cranial nerves II-XII are intact  except for slight flattening of her left nasal labial fold.  LUNGS:  Clear to auscultation bilaterally.  CARDIOVASCULAR:  Irregular.  Normal S1 and S2.  No murmurs, rubs or  gallops.  ABDOMEN:  Normal bowel sounds.  Soft, nontender, nondistended.  No  organomegaly.  EXTREMITIES:  Warm, well perfused.  No edema.  NEUROLOGIC:  Strength was slightly diminished in the muscle groups on  the left side.  The patient had a stable gait with very slight leaning  to the left.   LABORATORY STUDIES:  CBC and electrolytes were unremarkable.  Creatinine  was 0.3.  INR was 2.9.  CT scan of the head showed mild diffuse atrophy  but no acute abnormality.   ASSESSMENT AND PLAN:  This is a woman with a number of risk factors for  stroke who presents with weakness and gait disturbance on the left side.  Her symptoms have not entirely resolved and I think it is quite possible  that she has a stroke; however, with the rapid improvement in her  symptoms, this may prove to be only a TIA in the end.  Because of her  pacemaker, she is not a candidate for an MRI.   PLAN:  1. Admit to the hospital.  2. Continue current medications.  3. Evaluations by neurology, physical therapy, speech therapy.  4. Carotid Doppler exam.  5. Repeat CT scan if indicated by neurology.      Carey Bullocks. Gertie Exon, MD  Electronically Signed     PML/MEDQ  D:  10/05/2006  T:  10/05/2006  Job:  [id number]   cc:   Antony Contras, M.D.  Barnett Abu, M.D.

## 2010-10-01 NOTE — Discharge Summary (Signed)
NAMEKIRI, MOLINARO NO.:  [id number]   MEDICAL RECORD NO.:  [id number]          PATIENT TYPE:  INP   LOCATION:  2032                         FACILITY:  [facility]   PHYSICIAN:  Joesphine Bare, P.A.       DATE OF BIRTH:  [birth year]   DATE OF ADMISSION:  08/24/2006  DATE OF DISCHARGE:  08/27/2006                               DISCHARGE SUMMARY   DISCHARGE DIAGNOSES:  1. Paroxysmal atrial fibrillation.  2. Anticoagulation therapy with Coumadin.  3. Hypertension.  4. Hyperlipidemia.  5. Long-term medication use.   HISTORY OF PRESENT ILLNESS:  Ms. Bean is a 77 year old female who was  admitted to [facility] with paroxysmal atrial fibrillation.  She was  admitted for Tikosyn therapy.  She remained on this for 72 hours, and  then was discharged to home.  On discharged, she was converted to sinus  rhythm with a 4 second pause when she did convert.  Her GPC was 0.489.  Her medications were adjusted, and she was discharged to home in stable,  but improved condition.   Her sodium 137, potassium 4.7, BUN 15, creatinine 0.67, hemoglobin A1c  7.6 (needs to follow up with her primary care physician).  PT 21.3, INR  1.8.   DISCHARGE MEDICATIONS:  1. Tikosyn 250 mg 1 tablet in morning, 1/2 tablet in the evening.  2. Metoprolol 50 mg one p.o. b.i.d.  3. Cardizem-LA 120 mg a day.  4. Coumadin as prior to admission.  5. Metformin 1000 mg twice a day.  6. Crestor 25 mg a day.  7. Multivitamin daily.  8. Tums daily.  9. Xanax p.r.n.  10.__________ 20 mg a day.   She is to stop __________, remain on Ziac.  Increase activity slowly.  Follow up with Dr. Leonia Reeves and __________ on September 04, 2006, at 10:20  a.m.      Joesphine Bare, P.A.    LB/MEDQ  D:  11/03/2006  T:  11/04/2006  Job:  [id number]   cc:   Antony Contras, M.D.

## 2010-10-04 NOTE — H&P (Signed)
NAMEMARYJOY, [name]                 ACCOUNT NO.:  [id number]   MEDICAL RECORD NO.:  [id number]           PATIENT TYPE:   LOCATION:                                 FACILITY:   PHYSICIAN:  Fransico Him, M.D.     DATE OF BIRTH:  [birth year]   DATE OF ADMISSION:  08/28/2006  DATE OF DISCHARGE:                              HISTORY & PHYSICAL   PRIMARY CARE PHYSICIAN:  Biagio Borg, M.D.   CARDIOLOGIST:  Barnett Abu, M.D.   CHIEF COMPLAINT:  Palpitations.   HISTORY OF PRESENT ILLNESS:  [name] is a 77 year old female with a  known history of paroxysmal atrial fibrillation status post multiple  combinations of Cardizem and Rythmol, the latter of which was  discontinued secondary to bradycardia.  She was just discharged from the  [facility] on yesterday on Tikosyn following a Tikosyn load.  On today she re-presented to the [facility] with a chief  complaint of palpitations similar to when she has previously been in  atrial fibrillation.  A 12-lead EKG confirmed atrial flutter with a  ventricular rate of 143 beats per minute with nonspecific ST-T wave  abnormalities.  There was no evidence of ischemic changes.  She is being  readmitted to the [facility] for rate control.   PAST MEDICAL HISTORY:  1. Paroxysmal atrial fibrillation.  2. Diabetes mellitus.  3. Cataracts.  4. Dyslipidemia.  5. Hypertension.  6. Systemic anticoagulation on Coumadin therapy.   ALLERGIES:  No known drug allergies.   CURRENT MEDICATIONS:  1. Warfarin as directed.  2. Metformin 1000 mg twice daily.  3. Crestor 10 mg daily.  4. Metoprolol 50 mg twice daily.  5. Multivitamin one tablet daily.  6. Tums occasionally.  7. Xanax 0.5 mg one-half tablet as needed.  8. Benicar 20 mg daily.  9. Cardizem LA 120 mg.  10.Tikosyn 0.125 mg two tablets in the a.m. and one tablet in the p.m.   FAMILY HISTORY:  Both parents had a history of hypertension; however,  not  significant for early coronary artery disease.   SOCIAL HISTORY:  Former smoker with some caffeine use.  She denies  alcohol or illicit drug use.   REVIEW OF SYSTEMS:  All other systems reviewed are negative other than  what is stated in the HPI.  The patient currently denies chest pain,  shortness of breath, or dizziness.   PHYSICAL EXAMINATION:  GENERAL:  A 77 year old female, pleasant and  cooperate.  NAD.  VITAL SIGNS:  Blood pressure 110/70 with a heart rate of 88, weight  107.8 pounds, 5 feet 3 inches.  HEENT:  Unremarkable.  NECK:  Supple without JVD or bilateral carotid bruits.  Carotid  upstrokes 2+.  PULMONARY:  Breath sounds are equal and clear to auscultation  bilaterally.  No use of accessory muscles.  CARDIOVASCULAR:  Irregularly irregular.  Normal S1 and S2 without  murmurs, gallops, clicks or rub.  ABDOMEN:  Soft, nontender, nondistended, with active bowel sounds.  EXTREMITIES:  No peripheral edema, cyanosis, or clubbing.  DP and PT  pulses 2+/2 bilaterally.  NEUROLOGIC:  No focal motor or sensory deficits.  SKIN:  Warm and dry without rashes or lesions.  PSYCHIATRIC:  Normal mood and affect.   EKG:  As stated in the HPI.   ASSESSMENT:  1. Paroxysmal atrial flutter.  2. Dyslipidemia.  3. Hypertension.  4. Diabetes mellitus.  5. Cataracts.   PLAN:  1. Admit to a cardiac telemetry unit under the service of Dr. Leonia Reeves      with a diagnosis of atrial flutter.  2. Check 2-D echocardiogram and TSH.  3. Continue current medications including Tikosyn.  4. Initiate cardiology p.r.n. orders.  5. Check BMET, CBC, PT, PTT, INR, magnesium, BNP, and EKG.  6. Start IV Cardizem drip 5-15 mg/hour for rate control.  Hold      Cardizem for systolic blood pressure less than 100.  7. Sodium chloride 0.9% IV at 50 mL/hour.  8. Daily BMET, CBC, and EKG.  9. Hold metoprolol for systolic blood pressure less than 100 or heart      rate less than 60.  10.The patient was seen,  interviewed and examined by Dr. Fransico Him,      who participated in the medical decision making and plan of care.  11.Further measures per Dr. Rodell Perna.  12.Rule out MI with a cardiac panel q.8h. x3.  13.Oxygen via nasal cannula at 2 L per minute as needed, keeping O2      saturations greater than or equal to 91%.      [name], [place]      Fransico Him, M.D.  Electronically Signed    RDM/MEDQ  D:  08/28/2006  T:  08/28/2006  Job:  [id number]   cc:   Biagio Borg, M.D.

## 2010-10-04 NOTE — Consult Note (Signed)
[name], [name]                 ACCOUNT NO.:  [id number]   MEDICAL RECORD NO.:  [id number]          PATIENT TYPE:  INP   LOCATION:  4706                         FACILITY:  [place]   PHYSICIAN:  Champ Mungo. Lovena Le, MD    DATE OF BIRTH:  [birth year]   DATE OF CONSULTATION:  DATE OF DISCHARGE:                                 CONSULTATION   INDICATION FOR CONSULTATION:  Evaluation of paroxysmal atrial  fibrillation and symptomatic tachycardia/bradycardia.   HISTORY OF PRESENT ILLNESS:  The patient is a very pleasant 77 year old  woman who has had several years of paroxysmal atrial fibrillation.  She  has been tried on multiple medications including calcium channel  blockers, beta blockers, Rythmol, and most recently Tikosyn.  Despite  being on Tikosyn, the patient has been fairly symptomatic with her  paroxysmal atrial fibrillation and was recently re-admitted with more  atrial fibrillation.  When she terminates from atrial fibrillation, she  has had pauses up to 5 seconds and sinus bradycardia.  She is now  referred for additional evaluation.  The patient denies chest pain,  although she does have very minimal chest pressure when she is in her  rapid atrial fibrillation.  She has never had syncope.  She denies  shortness of breath.   PAST MEDICAL HISTORY:  She has diabetes, cataracts, dyslipidemia,  hypertension, and is systemically anticoagulated with Coumadin.   MEDICATIONS:  Warfarin, metformin, Crestor, metoprolol, multivitamin,  Cardizem, and Tikosyn.   FAMILY HISTORY:  Notable for both parents having history of hypertension  but no significant early premature coronary disease.   SOCIAL HISTORY:  The patient has a history of tobacco use in the past.  She smokes none now and has not done so for many years.  She denies  alcohol abuse.   REVIEW OF SYSTEMS:  As noted in the history of present illness.  Of  note, she has never had syncope.  She denies palpitations.  She has very  minimal amounts of diaphoresis.  The rest of her review of systems was  negative except as noted in the history of present illness.  Also, she  has some difficulty with her vision secondary to cataracts.   PHYSICAL EXAMINATION:  VITAL SIGNS:  She is a pleasant, well-appearing  elderly woman in no acute distress.  The blood pressure was 105/55.  The  pulse was 50 and regular.  The respirations were 18.  The weight was  approximately 50 kg.  HEENT:  Normocephalic and atraumatic.  Pupils equal and round.  Oropharynx was moist.  The sclerae were anicteric.  NECK:  Revealed no jugular venous distention, no thyromegaly.  Trachea  was midline.  The carotids were 2+ and symmetric.  LUNGS:  Clear bilaterally to auscultation.  There were no wheezes,  rales, or rhonchi.  No increased work of breathing.  CARDIOVASCULAR:  Regular rate and rhythm with normal S1 and S2.  There  are no murmurs, rubs, or gallops.  PMI was not enlarged nor laterally  displaced.  ABDOMEN:  Abdominal exam was soft, nontender, and nondistended.  There  is no  organomegaly.  Bowel sounds are present.  There is no rebound or  guarding.  EXTREMITIES:  Demonstrate no clubbing, cyanosis, or edema.  The pulses  are 2+ and symmetric.  NEUROLOGIC:  Alert and oriented times 3 with cranial nerves intact.  Strength was 5/5 and symmetric.   The EKG demonstrates sinus rhythm with normal axis and intervals.  Also,  EKG demonstrates atrial fibrillation with a fairly decent controlled  ventricular response.  It also demonstrates post-termination pauses up  to 5 seconds.   IMPRESSION:  1. Symptomatic tachycardia/bradycardia syndrome.  2. Paroxysmal atrial fibrillation with multiple failed medical      therapies.  3. Hypertension.   DISCUSSION:  I have discussed the treatment options with the patient.  The best option, I think, would be to initiate amiodarone therapy,  discontinue Tikosyn, and proceed with permanent pacemaker  implantation.  Ultimately, if amiodarone also failed to maintain sinus rhythm, then the  AV node ablation would be a consideration, although I think that the  likelihood is that amiodarone will work quite nicely in this patient.      Champ Mungo. Lovena Le, MD  Electronically Signed     GWT/MEDQ  D:  08/31/2006  T:  08/31/2006  Job:  [id number]   cc:   Barnett Abu, M.D.

## 2010-10-04 NOTE — Op Note (Signed)
NAMEJENNAE, [name]                 ACCOUNT NO.:  [id number]   MEDICAL RECORD NO.:  [id number]          PATIENT TYPE:  INP   LOCATION:  4706                         FACILITY:  [facility]   PHYSICIAN:  Barnett Abu, M.D.  DATE OF BIRTH:  1928-12-07   DATE OF PROCEDURE:  09/02/2006  DATE OF DISCHARGE:                               OPERATIVE REPORT   PROCEDURES PERFORMED:  1. Insertion dual-chamber permanent transvenous pacemaker.  2. Left subclavian venogram.  3. Elective direct current cardioversion.   INDICATIONS FOR PROCEDURE:  [name] is a 77 year old woman whose  been recently plagued with paroxysmal atrial fibrillation and has failed  several antiarrhythmic therapies including sotalol, Rythmol and now  Tikosyn.  An electrophysiology consult is recommended, the initiation of  amiodarone.  Because of prolonged pauses after conversion of atrial  fibrillation to sinus rhythm and because of relatively slow ventricular  response to atrial fibrillation, a pacemaker is placed with the  diagnosis of tachybrady syndrome and sick sinus syndrome.   DESCRIPTION OF PROCEDURE:  The patient is brought to the cardiac  catheterization laboratory in the fasting state.  The left prepectoral  region was prepped and draped in the usual sterile fashion.  Local  anesthesia was obtained with the infiltration of 1% lidocaine with  epinephrine throughout the left prepectoral region.  A 6-7 cm incision  was made in the deltopectoral groove and this was carried down by sharp  dissection and electrocautery to the prepectoral fascia.  There a plane  was lifted and a pocket formed inferiorly and medially using  electrocautery and blunt dissection.  The pocket was then packed with 1%  kanamycin soaked gauze.   A left subclavian venogram was then performed with a peripheral  injection of 20 mL of Omnipaque.  A digital cineangiogram was obtained  and road mapped to guide future left subclavian puncture.   The  venogram did demonstrate the left subclavian vein to be widely patent  and coursing in a normal fashion over the anterior surface of the first  rib and beneath the middle third of the clavicle.  There was no evidence  for persistence of the left superior vena cava.   The left subclavian vein was then punctured twice initially with an 18-  gauge thin-wall needle through which was passed a 0.038-inch tight J  guidewire.  The second puncture was performed with a micropuncture  needle which was then upsized and a second 0.038 inch wire placed.  Over  the initial guidewire, a 7-French tearaway  sheath and dilator were  advanced. The dilator and wire were removed and the ventricular lead was  advanced to the level of the right atrium.  The sheath was torn away.  Using standard technique and fluoroscopic landmarks, the lead was  manipulated into the right interventricular septum.  This was verified  in the LAO projection.  It was an active fixation lead and the screw was  advanced as appropriate.  It was tested for adequate pacing parameters  and this was noted below.  There was a 6 mV indirect current seen.  It  was also tested for diaphragmatic pacing and none was found.  The lead  was then sutured into place using three separate #0 silk ligatures.  Over the remaining guidewire, a second 7-French tearaway sheath and  dilator were advanced.  The dilator and wire were removed and the atrial  lead was advanced to the level of the right atrium.  The sheath was torn  away.  The lead was then manipulated into the right atrial appendage.  The patient was in atrial fibrillation at that time and the typical  rapid movement of the tip of the lead could be seen.  Again this was an  active fixation lead and the screw was advanced as appropriate.  It was  tested for adequate pacing and had adequate resistance and the  fibrillatory waves had an amplitude of 2-2.5 mV.   I then elected to proceed with  direct current cardioversion.  The  patient had received 10 mg of p.o. diazepam preop.  She received an  additional 2 mg of midazolam and adequate moderate sedation was  achieved.  She was then cardioverted using 50 joules of direct current  energy which was biphasic and synchronized.  There was prompt return of  sinus rhythm.  The lead was again tested for adequate pacing parameters  and this will be in the report below.  It was tested for diaphragmatic  pacing and none was found.  The lead was then sutured into place using 3  separate #0 silk ligatures.   The kanamycin-soaked gauze was removed from the pocket and the pocket  was copiously irrigated using 1% kanamycin solution.  The leads were  then attached to the pacing generator carefully identifying each by its  serial number and placing each into the appropriate receptacle.  Each  lead was tightened into place and tested for security.  The leads were  then wound beneath the pacing generator and the generator was placed in  the pocket.  An #0 silk anchoring suture was applied.  The pocket was  then inspected for bleeding and none was found.  The wound was closed  using 2-0 Vicryl in a running fashion for the subcutaneous layer.  The  skin was approximated using 4-0 Vicryl in a running subcuticular  fashion.  Steri-Strips and a sterile dressing were applied and the  patient was transported to the recovery area in an Apace, Vsense mode.   EQUIPMENT DATA:  The pacing generator is a Medtronic EnRhythm model  [id number], serial [id number].  The atrial lead is a Medtronic model  [id number], serial # [id number].  The ventricular lead is Medtronic model  [id number], serial [id number].   PACING DATA:  The atrial lead detected a 3.0 mV P-wave.  The pacing  threshold was 0.9 volts at 0.5 milliseconds pulse width.  The impedance  was 615 ohms resulting in a current at capture threshold of 1.7 MA.  The ventricular lead detected a 7.3 mV R wave.  The  pacing threshold was 0.6  volts at 0.5 milliseconds pulse width.  The impedance was 639 ohms  resulting in a current at capture threshold of 0.9 MA.      Barnett Abu, M.D.  Electronically Signed     JHE/MEDQ  D:  09/02/2006  T:  09/02/2006  Job:  [id number]   cc:   Antony Contras, M.D.

## 2011-02-13 LAB — CBC
HCT: 30.1 — ABNORMAL LOW
Hemoglobin: 9.9 — ABNORMAL LOW
MCHC: 32.9
MCV: 90.6
Platelets: 253
RBC: 3.32 — ABNORMAL LOW
RDW: 16.6 — ABNORMAL HIGH
WBC: 10

## 2011-02-13 LAB — BASIC METABOLIC PANEL
BUN: 15
CO2: 27
Calcium: 8.9
Chloride: 98
Creatinine, Ser: 0.9
GFR calc Af Amer: >60
GFR calc non Af Amer: >60
Glucose, Bld: 206 — ABNORMAL HIGH
Potassium: 4.4
Sodium: 134 — ABNORMAL LOW

## 2011-02-13 LAB — APTT: aPTT: 69 — ABNORMAL HIGH

## 2011-02-13 LAB — DIFFERENTIAL
Basophils Absolute: 0
Basophils Relative: 0
Eosinophils Absolute: 0.2
Eosinophils Relative: 2
Lymphocytes Relative: 21
Lymphs Abs: 2.1
Monocytes Absolute: 0.9
Monocytes Relative: 9
Neutro Abs: 6.8
Neutrophils Relative %: 68

## 2011-02-13 LAB — PROTIME-INR
INR: 3.6 — ABNORMAL HIGH
Prothrombin Time: 36.9 — ABNORMAL HIGH

## 2011-02-28 LAB — COMPREHENSIVE METABOLIC PANEL
ALT: 73 — ABNORMAL HIGH
AST: 49 — ABNORMAL HIGH
Albumin: 2.8 — ABNORMAL LOW
Alkaline Phosphatase: 60
BUN: 12
CO2: 29
Calcium: 8.5
Chloride: 102
Creatinine, Ser: 1.08
GFR calc Af Amer: 60 — ABNORMAL LOW
GFR calc non Af Amer: 49 — ABNORMAL LOW
Glucose, Bld: 93
Potassium: 3.6
Sodium: 140
Total Bilirubin: 0.6
Total Protein: 4.8 — ABNORMAL LOW

## 2011-02-28 LAB — MAGNESIUM: Magnesium: 1.5

## 2011-02-28 LAB — BASIC METABOLIC PANEL
BUN: 5 — ABNORMAL LOW
CO2: 29
Calcium: 8.6
Chloride: 102
Creatinine, Ser: 0.81
GFR calc Af Amer: >60
GFR calc non Af Amer: >60
Glucose, Bld: 116 — ABNORMAL HIGH
Potassium: 4
Sodium: 137

## 2011-02-28 LAB — CBC
HCT: 32 — ABNORMAL LOW
Hemoglobin: 10.8 — ABNORMAL LOW
MCHC: 33.7
MCV: 89.3
Platelets: 208
RBC: 3.58 — ABNORMAL LOW
RDW: 16.2 — ABNORMAL HIGH
WBC: 10.4

## 2011-02-28 LAB — PROTIME-INR
INR: 2.4 — ABNORMAL HIGH
INR: 3.5 — ABNORMAL HIGH
Prothrombin Time: 27.2 — ABNORMAL HIGH
Prothrombin Time: 36.6 — ABNORMAL HIGH

## 2011-02-28 LAB — PHOSPHORUS: Phosphorus: 2.3

## 2011-03-03 LAB — URINALYSIS, ROUTINE W REFLEX MICROSCOPIC
Glucose, UA: 100 — AB
Hgb urine dipstick: NEGATIVE
Ketones, ur: 15 — AB
Nitrite: POSITIVE — AB
Protein, ur: NEGATIVE
Specific Gravity, Urine: 1.031 — ABNORMAL HIGH
Urobilinogen, UA: 1
pH: 5

## 2011-03-03 LAB — URINE MICROSCOPIC-ADD ON

## 2011-03-03 LAB — COMPREHENSIVE METABOLIC PANEL
ALT: 105 — ABNORMAL HIGH
AST: 78 — ABNORMAL HIGH
Albumin: 3.7
Alkaline Phosphatase: 76
BUN: 22
CO2: 27
Calcium: 9.3
Chloride: 96
Creatinine, Ser: 2.18 — ABNORMAL HIGH
GFR calc Af Amer: 27 — ABNORMAL LOW
GFR calc non Af Amer: 22 — ABNORMAL LOW
Glucose, Bld: 190 — ABNORMAL HIGH
Potassium: 3.9
Sodium: 134 — ABNORMAL LOW
Total Bilirubin: 0.5
Total Protein: 6

## 2011-03-03 LAB — DIFFERENTIAL
Basophils Absolute: 0.1
Basophils Relative: 1
Eosinophils Absolute: 0.1
Eosinophils Relative: 1
Lymphocytes Relative: 12
Lymphs Abs: 1.2
Monocytes Absolute: 0.9 — ABNORMAL HIGH
Monocytes Relative: 9
Neutro Abs: 7.8 — ABNORMAL HIGH
Neutrophils Relative %: 78 — ABNORMAL HIGH

## 2011-03-03 LAB — CBC
HCT: 36.4
Hemoglobin: 12.2
MCHC: 33.6
MCV: 89.8
Platelets: 234
RBC: 4.05
RDW: 15.9 — ABNORMAL HIGH
WBC: 10.1

## 2011-03-03 LAB — HEMOGLOBIN A1C
Hgb A1c MFr Bld: 7.4 — ABNORMAL HIGH
Mean Plasma Glucose: 186

## 2011-03-03 LAB — CULTURE, BLOOD (ROUTINE X 2)
Culture: NO GROWTH
Culture: NO GROWTH

## 2011-03-03 LAB — URINE CULTURE: Colony Count: 5000

## 2011-03-03 LAB — PROTIME-INR
INR: 3.1 — ABNORMAL HIGH
Prothrombin Time: 33.3 — ABNORMAL HIGH

## 2011-03-05 LAB — COMPREHENSIVE METABOLIC PANEL
ALT: 64 — ABNORMAL HIGH
AST: 37
Albumin: 3.1 — ABNORMAL LOW
Alkaline Phosphatase: 83
BUN: 16
CO2: 26
Calcium: 9.2
Chloride: 96
Creatinine, Ser: 0.94
GFR calc Af Amer: >60
GFR calc non Af Amer: 58 — ABNORMAL LOW
Glucose, Bld: 157 — ABNORMAL HIGH
Potassium: 5
Sodium: 132 — ABNORMAL LOW
Total Bilirubin: 0.6
Total Protein: 6.3

## 2011-03-05 LAB — BASIC METABOLIC PANEL
BUN: 12
BUN: 8
BUN: 8
CO2: 25
CO2: 26
CO2: 30
Calcium: 8.5
Calcium: 8.8
Calcium: 9.3
Chloride: 100
Chloride: 101
Chloride: 106
Creatinine, Ser: 0.83
Creatinine, Ser: 0.91
Creatinine, Ser: 1.09
GFR calc Af Amer: 59 — ABNORMAL LOW
GFR calc Af Amer: >60
GFR calc Af Amer: >60
GFR calc non Af Amer: 49 — ABNORMAL LOW
GFR calc non Af Amer: >60
GFR calc non Af Amer: >60
Glucose, Bld: 143 — ABNORMAL HIGH
Glucose, Bld: 155 — ABNORMAL HIGH
Glucose, Bld: 163 — ABNORMAL HIGH
Potassium: 3.9
Potassium: 4.1
Potassium: 4.5
Sodium: 137
Sodium: 138
Sodium: 140

## 2011-03-05 LAB — URINALYSIS, ROUTINE W REFLEX MICROSCOPIC
Bilirubin Urine: NEGATIVE
Glucose, UA: NEGATIVE
Hgb urine dipstick: NEGATIVE
Ketones, ur: 15 — AB
Nitrite: NEGATIVE
Protein, ur: NEGATIVE
Specific Gravity, Urine: 1.023
Urobilinogen, UA: 1
pH: 6.5

## 2011-03-05 LAB — HEMOGLOBIN A1C: Hgb A1c MFr Bld: 7.4 — ABNORMAL HIGH

## 2011-03-05 LAB — CBC
HCT: 34.3 — ABNORMAL LOW
HCT: 36.6
HCT: 37
HCT: 37.5
Hemoglobin: 11.4 — ABNORMAL LOW
Hemoglobin: 12.1
Hemoglobin: 12.2
Hemoglobin: 12.4
MCHC: 33
MCHC: 33
MCHC: 33.2
MCHC: 33.3
MCV: 91.8
MCV: 92.6
MCV: 93
MCV: 93.2
Platelets: 266
Platelets: 274
Platelets: 285
Platelets: 299
RBC: 3.74 — ABNORMAL LOW
RBC: 3.95
RBC: 3.98
RBC: 4.02
RDW: 13.9
RDW: 13.9
RDW: 14.3 — ABNORMAL HIGH
RDW: 14.3 — ABNORMAL HIGH
WBC: 10.6 — ABNORMAL HIGH
WBC: 10.7 — ABNORMAL HIGH
WBC: 12.3 — ABNORMAL HIGH
WBC: 15.4 — ABNORMAL HIGH

## 2011-03-05 LAB — CARDIAC PANEL(CRET KIN+CKTOT+MB+TROPI)
CK, MB: 0.8
CK, MB: 0.9
Relative Index: INVALID
Relative Index: INVALID
Total CK: 24
Total CK: 27
Troponin I: 0.01
Troponin I: 0.02

## 2011-03-05 LAB — URINE MICROSCOPIC-ADD ON

## 2011-03-05 LAB — DIGOXIN LEVEL
Digoxin Level: 1.6
Digoxin Level: 2.8

## 2011-03-05 LAB — DIFFERENTIAL
Basophils Absolute: 0
Basophils Absolute: 0
Basophils Relative: 0
Basophils Relative: 0
Eosinophils Absolute: 0.1
Eosinophils Absolute: 0.2
Eosinophils Relative: 0
Eosinophils Relative: 2
Lymphocytes Relative: 17
Lymphocytes Relative: 8 — ABNORMAL LOW
Lymphs Abs: 1.3
Lymphs Abs: 1.8
Monocytes Absolute: 1 — ABNORMAL HIGH
Monocytes Absolute: 1 — ABNORMAL HIGH
Monocytes Relative: 10
Monocytes Relative: 7
Neutro Abs: 13.1 — ABNORMAL HIGH
Neutro Abs: 7.6
Neutrophils Relative %: 71
Neutrophils Relative %: 85 — ABNORMAL HIGH

## 2011-03-05 LAB — PROTIME-INR
INR: 2.3 — ABNORMAL HIGH
INR: 2.9 — ABNORMAL HIGH
INR: 3 — ABNORMAL HIGH
INR: 3.1 — ABNORMAL HIGH
INR: 3.3 — ABNORMAL HIGH
INR: 3.3 — ABNORMAL HIGH
Prothrombin Time: 26.7 — ABNORMAL HIGH
Prothrombin Time: 31.8 — ABNORMAL HIGH
Prothrombin Time: 32.7 — ABNORMAL HIGH
Prothrombin Time: 33.5 — ABNORMAL HIGH
Prothrombin Time: 35.6 — ABNORMAL HIGH
Prothrombin Time: 35.8 — ABNORMAL HIGH

## 2011-03-05 LAB — URINE CULTURE: Colony Count: >=100000

## 2011-03-05 LAB — B-NATRIURETIC PEPTIDE (CONVERTED LAB): Pro B Natriuretic peptide (BNP): 357 — ABNORMAL HIGH

## 2011-03-05 LAB — POCT CARDIAC MARKERS
CKMB, poc: <1 — ABNORMAL LOW
Myoglobin, poc: 44.2
Operator id: 277751
Troponin i, poc: <0.05

## 2011-03-06 LAB — AMIODARONE LEVEL
Amiodarone Lvl: 1.38 ug/mL (ref 0.50–2.00)
N-Desethyl-Amiodarone: 0.99 ug/mL (ref 0.25–3.50)

## 2011-05-26 DIAGNOSIS — Z950 Presence of cardiac pacemaker: Secondary | ICD-10-CM | POA: Diagnosis not present

## 2011-05-26 DIAGNOSIS — I495 Sick sinus syndrome: Secondary | ICD-10-CM | POA: Diagnosis not present

## 2011-06-11 DIAGNOSIS — Z950 Presence of cardiac pacemaker: Secondary | ICD-10-CM | POA: Diagnosis not present

## 2011-06-11 DIAGNOSIS — F17200 Nicotine dependence, unspecified, uncomplicated: Secondary | ICD-10-CM | POA: Diagnosis not present

## 2011-06-11 DIAGNOSIS — I4891 Unspecified atrial fibrillation: Secondary | ICD-10-CM | POA: Diagnosis not present

## 2011-06-11 DIAGNOSIS — Z7901 Long term (current) use of anticoagulants: Secondary | ICD-10-CM | POA: Diagnosis not present

## 2011-06-11 DIAGNOSIS — I495 Sick sinus syndrome: Secondary | ICD-10-CM | POA: Diagnosis not present

## 2011-06-24 DIAGNOSIS — I10 Essential (primary) hypertension: Secondary | ICD-10-CM | POA: Diagnosis not present

## 2011-06-24 DIAGNOSIS — E782 Mixed hyperlipidemia: Secondary | ICD-10-CM | POA: Diagnosis not present

## 2011-06-24 DIAGNOSIS — F411 Generalized anxiety disorder: Secondary | ICD-10-CM | POA: Diagnosis not present

## 2011-06-24 DIAGNOSIS — E119 Type 2 diabetes mellitus without complications: Secondary | ICD-10-CM | POA: Diagnosis not present

## 2011-06-24 DIAGNOSIS — I4891 Unspecified atrial fibrillation: Secondary | ICD-10-CM | POA: Diagnosis not present

## 2011-06-24 DIAGNOSIS — G4700 Insomnia, unspecified: Secondary | ICD-10-CM | POA: Diagnosis not present

## 2011-07-04 DIAGNOSIS — E119 Type 2 diabetes mellitus without complications: Secondary | ICD-10-CM | POA: Diagnosis not present

## 2011-07-04 DIAGNOSIS — H04129 Dry eye syndrome of unspecified lacrimal gland: Secondary | ICD-10-CM | POA: Diagnosis not present

## 2011-07-04 DIAGNOSIS — Z961 Presence of intraocular lens: Secondary | ICD-10-CM | POA: Diagnosis not present

## 2011-07-16 DIAGNOSIS — I4891 Unspecified atrial fibrillation: Secondary | ICD-10-CM | POA: Diagnosis not present

## 2011-07-16 DIAGNOSIS — Z7901 Long term (current) use of anticoagulants: Secondary | ICD-10-CM | POA: Diagnosis not present

## 2011-08-13 DIAGNOSIS — I4891 Unspecified atrial fibrillation: Secondary | ICD-10-CM | POA: Diagnosis not present

## 2011-08-13 DIAGNOSIS — Z7901 Long term (current) use of anticoagulants: Secondary | ICD-10-CM | POA: Diagnosis not present

## 2011-09-01 DIAGNOSIS — J309 Allergic rhinitis, unspecified: Secondary | ICD-10-CM | POA: Diagnosis not present

## 2011-09-01 DIAGNOSIS — R197 Diarrhea, unspecified: Secondary | ICD-10-CM | POA: Diagnosis not present

## 2011-09-01 DIAGNOSIS — I10 Essential (primary) hypertension: Secondary | ICD-10-CM | POA: Diagnosis not present

## 2011-09-01 DIAGNOSIS — G609 Hereditary and idiopathic neuropathy, unspecified: Secondary | ICD-10-CM | POA: Diagnosis not present

## 2011-09-09 DIAGNOSIS — Z7901 Long term (current) use of anticoagulants: Secondary | ICD-10-CM | POA: Diagnosis not present

## 2011-09-09 DIAGNOSIS — I4891 Unspecified atrial fibrillation: Secondary | ICD-10-CM | POA: Diagnosis not present

## 2011-09-16 DIAGNOSIS — M2040 Other hammer toe(s) (acquired), unspecified foot: Secondary | ICD-10-CM | POA: Diagnosis not present

## 2011-09-16 DIAGNOSIS — M79609 Pain in unspecified limb: Secondary | ICD-10-CM | POA: Diagnosis not present

## 2011-09-16 DIAGNOSIS — E1149 Type 2 diabetes mellitus with other diabetic neurological complication: Secondary | ICD-10-CM | POA: Diagnosis not present

## 2011-09-17 DIAGNOSIS — Z950 Presence of cardiac pacemaker: Secondary | ICD-10-CM | POA: Diagnosis not present

## 2011-10-07 DIAGNOSIS — I4891 Unspecified atrial fibrillation: Secondary | ICD-10-CM | POA: Diagnosis not present

## 2011-10-07 DIAGNOSIS — Z7901 Long term (current) use of anticoagulants: Secondary | ICD-10-CM | POA: Diagnosis not present

## 2011-11-11 DIAGNOSIS — Z7901 Long term (current) use of anticoagulants: Secondary | ICD-10-CM | POA: Diagnosis not present

## 2011-11-11 DIAGNOSIS — I4891 Unspecified atrial fibrillation: Secondary | ICD-10-CM | POA: Diagnosis not present

## 2011-11-18 DIAGNOSIS — E1149 Type 2 diabetes mellitus with other diabetic neurological complication: Secondary | ICD-10-CM | POA: Diagnosis not present

## 2011-11-18 DIAGNOSIS — Z79899 Other long term (current) drug therapy: Secondary | ICD-10-CM | POA: Diagnosis not present

## 2011-12-16 DIAGNOSIS — Z7901 Long term (current) use of anticoagulants: Secondary | ICD-10-CM | POA: Diagnosis not present

## 2011-12-16 DIAGNOSIS — Z950 Presence of cardiac pacemaker: Secondary | ICD-10-CM | POA: Diagnosis not present

## 2011-12-16 DIAGNOSIS — R634 Abnormal weight loss: Secondary | ICD-10-CM | POA: Diagnosis not present

## 2011-12-16 DIAGNOSIS — I4891 Unspecified atrial fibrillation: Secondary | ICD-10-CM | POA: Diagnosis not present

## 2011-12-16 DIAGNOSIS — I495 Sick sinus syndrome: Secondary | ICD-10-CM | POA: Diagnosis not present

## 2011-12-16 DIAGNOSIS — F17200 Nicotine dependence, unspecified, uncomplicated: Secondary | ICD-10-CM | POA: Diagnosis not present

## 2011-12-23 DIAGNOSIS — G4700 Insomnia, unspecified: Secondary | ICD-10-CM | POA: Diagnosis not present

## 2011-12-23 DIAGNOSIS — I10 Essential (primary) hypertension: Secondary | ICD-10-CM | POA: Diagnosis not present

## 2011-12-23 DIAGNOSIS — E1142 Type 2 diabetes mellitus with diabetic polyneuropathy: Secondary | ICD-10-CM | POA: Diagnosis not present

## 2011-12-23 DIAGNOSIS — I4891 Unspecified atrial fibrillation: Secondary | ICD-10-CM | POA: Diagnosis not present

## 2011-12-23 DIAGNOSIS — E782 Mixed hyperlipidemia: Secondary | ICD-10-CM | POA: Diagnosis not present

## 2011-12-23 DIAGNOSIS — F17200 Nicotine dependence, unspecified, uncomplicated: Secondary | ICD-10-CM | POA: Diagnosis not present

## 2011-12-23 DIAGNOSIS — E119 Type 2 diabetes mellitus without complications: Secondary | ICD-10-CM | POA: Diagnosis not present

## 2011-12-23 DIAGNOSIS — F411 Generalized anxiety disorder: Secondary | ICD-10-CM | POA: Diagnosis not present

## 2011-12-29 DIAGNOSIS — Z950 Presence of cardiac pacemaker: Secondary | ICD-10-CM | POA: Diagnosis not present

## 2011-12-29 DIAGNOSIS — I495 Sick sinus syndrome: Secondary | ICD-10-CM | POA: Diagnosis not present

## 2012-01-13 DIAGNOSIS — Z79899 Other long term (current) drug therapy: Secondary | ICD-10-CM | POA: Diagnosis not present

## 2012-01-20 DIAGNOSIS — I4891 Unspecified atrial fibrillation: Secondary | ICD-10-CM | POA: Diagnosis not present

## 2012-01-20 DIAGNOSIS — Z7901 Long term (current) use of anticoagulants: Secondary | ICD-10-CM | POA: Diagnosis not present

## 2012-02-10 DIAGNOSIS — B351 Tinea unguium: Secondary | ICD-10-CM | POA: Diagnosis not present

## 2012-02-10 DIAGNOSIS — M79609 Pain in unspecified limb: Secondary | ICD-10-CM | POA: Diagnosis not present

## 2012-02-17 DIAGNOSIS — I4891 Unspecified atrial fibrillation: Secondary | ICD-10-CM | POA: Diagnosis not present

## 2012-02-17 DIAGNOSIS — Z7901 Long term (current) use of anticoagulants: Secondary | ICD-10-CM | POA: Diagnosis not present

## 2012-03-10 DIAGNOSIS — Z23 Encounter for immunization: Secondary | ICD-10-CM | POA: Diagnosis not present

## 2012-03-22 DIAGNOSIS — Z7901 Long term (current) use of anticoagulants: Secondary | ICD-10-CM | POA: Diagnosis not present

## 2012-03-22 DIAGNOSIS — I4891 Unspecified atrial fibrillation: Secondary | ICD-10-CM | POA: Diagnosis not present

## 2012-04-20 DIAGNOSIS — Z950 Presence of cardiac pacemaker: Secondary | ICD-10-CM | POA: Diagnosis not present

## 2012-04-20 DIAGNOSIS — Z7901 Long term (current) use of anticoagulants: Secondary | ICD-10-CM | POA: Diagnosis not present

## 2012-04-20 DIAGNOSIS — I4891 Unspecified atrial fibrillation: Secondary | ICD-10-CM | POA: Diagnosis not present

## 2012-05-04 DIAGNOSIS — B351 Tinea unguium: Secondary | ICD-10-CM | POA: Diagnosis not present

## 2012-05-04 DIAGNOSIS — M79609 Pain in unspecified limb: Secondary | ICD-10-CM | POA: Diagnosis not present

## 2012-05-25 DIAGNOSIS — I4891 Unspecified atrial fibrillation: Secondary | ICD-10-CM | POA: Diagnosis not present

## 2012-05-25 DIAGNOSIS — Z7901 Long term (current) use of anticoagulants: Secondary | ICD-10-CM | POA: Diagnosis not present

## 2012-06-10 DIAGNOSIS — K1370 Unspecified lesions of oral mucosa: Secondary | ICD-10-CM | POA: Diagnosis not present

## 2012-06-18 DIAGNOSIS — Z950 Presence of cardiac pacemaker: Secondary | ICD-10-CM | POA: Diagnosis not present

## 2012-06-18 DIAGNOSIS — F17200 Nicotine dependence, unspecified, uncomplicated: Secondary | ICD-10-CM | POA: Diagnosis not present

## 2012-06-18 DIAGNOSIS — I4891 Unspecified atrial fibrillation: Secondary | ICD-10-CM | POA: Diagnosis not present

## 2012-06-18 DIAGNOSIS — Z7901 Long term (current) use of anticoagulants: Secondary | ICD-10-CM | POA: Diagnosis not present

## 2012-06-18 DIAGNOSIS — E119 Type 2 diabetes mellitus without complications: Secondary | ICD-10-CM | POA: Diagnosis not present

## 2012-06-25 DIAGNOSIS — G4700 Insomnia, unspecified: Secondary | ICD-10-CM | POA: Diagnosis not present

## 2012-06-25 DIAGNOSIS — I4891 Unspecified atrial fibrillation: Secondary | ICD-10-CM | POA: Diagnosis not present

## 2012-06-25 DIAGNOSIS — F411 Generalized anxiety disorder: Secondary | ICD-10-CM | POA: Diagnosis not present

## 2012-06-25 DIAGNOSIS — I10 Essential (primary) hypertension: Secondary | ICD-10-CM | POA: Diagnosis not present

## 2012-06-25 DIAGNOSIS — E119 Type 2 diabetes mellitus without complications: Secondary | ICD-10-CM | POA: Diagnosis not present

## 2012-06-25 DIAGNOSIS — F17200 Nicotine dependence, unspecified, uncomplicated: Secondary | ICD-10-CM | POA: Diagnosis not present

## 2012-06-25 DIAGNOSIS — E1142 Type 2 diabetes mellitus with diabetic polyneuropathy: Secondary | ICD-10-CM | POA: Diagnosis not present

## 2012-06-25 DIAGNOSIS — E782 Mixed hyperlipidemia: Secondary | ICD-10-CM | POA: Diagnosis not present

## 2012-06-29 DIAGNOSIS — Z7901 Long term (current) use of anticoagulants: Secondary | ICD-10-CM | POA: Diagnosis not present

## 2012-06-29 DIAGNOSIS — I4891 Unspecified atrial fibrillation: Secondary | ICD-10-CM | POA: Diagnosis not present

## 2012-07-05 DIAGNOSIS — E119 Type 2 diabetes mellitus without complications: Secondary | ICD-10-CM | POA: Diagnosis not present

## 2012-07-26 DIAGNOSIS — Z950 Presence of cardiac pacemaker: Secondary | ICD-10-CM | POA: Diagnosis not present

## 2012-07-26 DIAGNOSIS — I4891 Unspecified atrial fibrillation: Secondary | ICD-10-CM | POA: Diagnosis not present

## 2012-07-26 DIAGNOSIS — I495 Sick sinus syndrome: Secondary | ICD-10-CM | POA: Diagnosis not present

## 2012-08-03 DIAGNOSIS — M79609 Pain in unspecified limb: Secondary | ICD-10-CM | POA: Diagnosis not present

## 2012-08-06 DIAGNOSIS — Z7901 Long term (current) use of anticoagulants: Secondary | ICD-10-CM | POA: Diagnosis not present

## 2012-08-06 DIAGNOSIS — I4891 Unspecified atrial fibrillation: Secondary | ICD-10-CM | POA: Diagnosis not present

## 2012-09-08 DIAGNOSIS — Z7901 Long term (current) use of anticoagulants: Secondary | ICD-10-CM | POA: Diagnosis not present

## 2012-09-08 DIAGNOSIS — I4891 Unspecified atrial fibrillation: Secondary | ICD-10-CM | POA: Diagnosis not present

## 2012-10-13 DIAGNOSIS — I4891 Unspecified atrial fibrillation: Secondary | ICD-10-CM | POA: Diagnosis not present

## 2012-10-13 DIAGNOSIS — Z7901 Long term (current) use of anticoagulants: Secondary | ICD-10-CM | POA: Diagnosis not present

## 2012-10-26 DIAGNOSIS — M79609 Pain in unspecified limb: Secondary | ICD-10-CM | POA: Diagnosis not present

## 2012-10-26 DIAGNOSIS — B351 Tinea unguium: Secondary | ICD-10-CM | POA: Diagnosis not present

## 2012-11-16 DIAGNOSIS — Z950 Presence of cardiac pacemaker: Secondary | ICD-10-CM | POA: Diagnosis not present

## 2012-11-16 DIAGNOSIS — I4891 Unspecified atrial fibrillation: Secondary | ICD-10-CM | POA: Diagnosis not present

## 2012-11-24 DIAGNOSIS — I4891 Unspecified atrial fibrillation: Secondary | ICD-10-CM | POA: Diagnosis not present

## 2012-11-24 DIAGNOSIS — Z7901 Long term (current) use of anticoagulants: Secondary | ICD-10-CM | POA: Diagnosis not present

## 2012-12-17 DIAGNOSIS — I4891 Unspecified atrial fibrillation: Secondary | ICD-10-CM | POA: Diagnosis not present

## 2012-12-17 DIAGNOSIS — Z7901 Long term (current) use of anticoagulants: Secondary | ICD-10-CM | POA: Diagnosis not present

## 2012-12-17 DIAGNOSIS — E782 Mixed hyperlipidemia: Secondary | ICD-10-CM | POA: Diagnosis not present

## 2012-12-17 DIAGNOSIS — Z950 Presence of cardiac pacemaker: Secondary | ICD-10-CM | POA: Diagnosis not present

## 2012-12-17 DIAGNOSIS — F17200 Nicotine dependence, unspecified, uncomplicated: Secondary | ICD-10-CM | POA: Diagnosis not present

## 2012-12-17 DIAGNOSIS — I442 Atrioventricular block, complete: Secondary | ICD-10-CM | POA: Diagnosis not present

## 2012-12-24 DIAGNOSIS — E1149 Type 2 diabetes mellitus with other diabetic neurological complication: Secondary | ICD-10-CM | POA: Diagnosis not present

## 2012-12-24 DIAGNOSIS — G4700 Insomnia, unspecified: Secondary | ICD-10-CM | POA: Diagnosis not present

## 2012-12-24 DIAGNOSIS — F411 Generalized anxiety disorder: Secondary | ICD-10-CM | POA: Diagnosis not present

## 2012-12-24 DIAGNOSIS — E782 Mixed hyperlipidemia: Secondary | ICD-10-CM | POA: Diagnosis not present

## 2012-12-24 DIAGNOSIS — E1142 Type 2 diabetes mellitus with diabetic polyneuropathy: Secondary | ICD-10-CM | POA: Diagnosis not present

## 2012-12-24 DIAGNOSIS — Z1331 Encounter for screening for depression: Secondary | ICD-10-CM | POA: Diagnosis not present

## 2012-12-24 DIAGNOSIS — I4891 Unspecified atrial fibrillation: Secondary | ICD-10-CM | POA: Diagnosis not present

## 2012-12-24 DIAGNOSIS — I10 Essential (primary) hypertension: Secondary | ICD-10-CM | POA: Diagnosis not present

## 2012-12-27 DIAGNOSIS — Z950 Presence of cardiac pacemaker: Secondary | ICD-10-CM | POA: Diagnosis not present

## 2012-12-27 DIAGNOSIS — I495 Sick sinus syndrome: Secondary | ICD-10-CM | POA: Diagnosis not present

## 2013-01-06 DIAGNOSIS — N183 Chronic kidney disease, stage 3 unspecified: Secondary | ICD-10-CM | POA: Diagnosis not present

## 2013-01-18 DIAGNOSIS — Z7901 Long term (current) use of anticoagulants: Secondary | ICD-10-CM | POA: Diagnosis not present

## 2013-01-18 DIAGNOSIS — I4891 Unspecified atrial fibrillation: Secondary | ICD-10-CM | POA: Diagnosis not present

## 2013-01-24 DIAGNOSIS — Z950 Presence of cardiac pacemaker: Secondary | ICD-10-CM | POA: Diagnosis not present

## 2013-01-24 DIAGNOSIS — I4891 Unspecified atrial fibrillation: Secondary | ICD-10-CM | POA: Diagnosis not present

## 2013-01-24 DIAGNOSIS — I495 Sick sinus syndrome: Secondary | ICD-10-CM | POA: Diagnosis not present

## 2013-02-01 DIAGNOSIS — B351 Tinea unguium: Secondary | ICD-10-CM | POA: Diagnosis not present

## 2013-02-01 DIAGNOSIS — M79609 Pain in unspecified limb: Secondary | ICD-10-CM | POA: Diagnosis not present

## 2013-02-24 ENCOUNTER — Ambulatory Visit (INDEPENDENT_AMBULATORY_CARE_PROVIDER_SITE_OTHER): Payer: Medicare Other | Admitting: Pharmacist

## 2013-02-24 ENCOUNTER — Ambulatory Visit (INDEPENDENT_AMBULATORY_CARE_PROVIDER_SITE_OTHER): Payer: Medicare Other | Admitting: *Deleted

## 2013-02-24 DIAGNOSIS — I4891 Unspecified atrial fibrillation: Secondary | ICD-10-CM

## 2013-02-24 DIAGNOSIS — Z950 Presence of cardiac pacemaker: Secondary | ICD-10-CM | POA: Diagnosis not present

## 2013-02-24 DIAGNOSIS — I4821 Permanent atrial fibrillation: Secondary | ICD-10-CM | POA: Insufficient documentation

## 2013-02-24 LAB — PACEMAKER DEVICE OBSERVATION
AL AMPLITUDE: 2.1627 mv
AL IMPEDENCE PM: 592 Ohm
BATTERY VOLTAGE: 2.83 V
BMOD-0002RV: 7
BMOD-0003RV: 30
BMOD-0004RV: 5
BRDY-0002RV: 60 {beats}/min
BRDY-0004RV: 130 {beats}/min
RV LEAD IMPEDENCE PM: 424 Ohm
RV LEAD THRESHOLD: 1 V
VENTRICULAR PACING PM: 99.99

## 2013-02-24 LAB — POCT INR: INR: 2.3

## 2013-02-24 NOTE — Progress Notes (Signed)
Device check in clinic, all functions normal, no changes made, full details in PaceArt.  Battery at 2.83V, ERI is 2.81V  ROV w/ device clinic for battery check only.

## 2013-03-01 DIAGNOSIS — Z23 Encounter for immunization: Secondary | ICD-10-CM | POA: Diagnosis not present

## 2013-03-14 ENCOUNTER — Encounter: Payer: Self-pay | Admitting: Internal Medicine

## 2013-04-06 ENCOUNTER — Encounter: Payer: Self-pay | Admitting: Internal Medicine

## 2013-04-06 ENCOUNTER — Ambulatory Visit (INDEPENDENT_AMBULATORY_CARE_PROVIDER_SITE_OTHER): Payer: Medicare Other | Admitting: Pharmacist

## 2013-04-06 ENCOUNTER — Ambulatory Visit (INDEPENDENT_AMBULATORY_CARE_PROVIDER_SITE_OTHER): Payer: Medicare Other | Admitting: *Deleted

## 2013-04-06 DIAGNOSIS — I4891 Unspecified atrial fibrillation: Secondary | ICD-10-CM

## 2013-04-06 LAB — MDC_IDC_ENUM_SESS_TYPE_INCLINIC
Lead Channel Setting Pacing Amplitude: 2.5 V
Lead Channel Setting Pacing Pulse Width: 0.4 ms
Lead Channel Setting Sensing Sensitivity: 0.9 mV

## 2013-04-06 LAB — POCT INR: INR: 2.8

## 2013-04-06 NOTE — Progress Notes (Signed)
Battery check only today 2.83V.  ROV in December for recheck.

## 2013-04-29 ENCOUNTER — Other Ambulatory Visit: Payer: Self-pay | Admitting: Cardiology

## 2013-04-29 ENCOUNTER — Other Ambulatory Visit: Payer: Self-pay | Admitting: *Deleted

## 2013-04-29 MED ORDER — COUMADIN 1 MG PO TABS: 1.0000 mg | ORAL_TABLET | ORAL | Status: DC

## 2013-05-03 ENCOUNTER — Encounter: Payer: Self-pay | Admitting: Podiatry

## 2013-05-03 ENCOUNTER — Ambulatory Visit (INDEPENDENT_AMBULATORY_CARE_PROVIDER_SITE_OTHER): Payer: Medicare Other | Admitting: Podiatry

## 2013-05-03 VITALS — BP 149/76 | HR 82 | Resp 12

## 2013-05-03 DIAGNOSIS — B351 Tinea unguium: Secondary | ICD-10-CM | POA: Diagnosis not present

## 2013-05-03 DIAGNOSIS — E1149 Type 2 diabetes mellitus with other diabetic neurological complication: Secondary | ICD-10-CM

## 2013-05-03 DIAGNOSIS — M79609 Pain in unspecified limb: Secondary | ICD-10-CM

## 2013-05-03 MED ORDER — PREGABALIN 25 MG PO CAPS: ORAL_CAPSULE | ORAL | Status: DC

## 2013-05-03 NOTE — Progress Notes (Signed)
   Subjective:    Patient ID: [name], female    DOB: 09/06/1928, 79 y.o.   MRN: [id number]  HPI Comments: '' TOENAILS TRIM.''     Review of Systems     Objective:   Physical Exam: Pulses remain palpable. Vital signs are stable she is alert and oriented x3. Nails are thick yellow dystrophic onychomycotic and painful palpation.        Assessment & Plan:  Assessment: Pain in limb secondary to onychomycosis 1 through 5 bilateral.  Plan: Debridement of nails 1 through 5 bilateral.

## 2013-05-04 ENCOUNTER — Ambulatory Visit (INDEPENDENT_AMBULATORY_CARE_PROVIDER_SITE_OTHER): Payer: Medicare Other | Admitting: *Deleted

## 2013-05-04 ENCOUNTER — Ambulatory Visit (INDEPENDENT_AMBULATORY_CARE_PROVIDER_SITE_OTHER): Payer: Medicare Other | Admitting: Pharmacist

## 2013-05-04 DIAGNOSIS — I4891 Unspecified atrial fibrillation: Secondary | ICD-10-CM

## 2013-05-04 LAB — MDC_IDC_ENUM_SESS_TYPE_INCLINIC
Battery Voltage: 2.85 V
Brady Statistic RV Percent Paced: 99.99 %
Date Time Interrogation Session: 20141217141213
Lead Channel Impedance Value: 424 Ohm
Lead Channel Impedance Value: 600 Ohm
Lead Channel Setting Pacing Amplitude: 2.5 V
Lead Channel Setting Pacing Pulse Width: 0.4 ms
Lead Channel Setting Sensing Sensitivity: 0.9 mV
Zone Setting Detection Interval: 350 ms
Zone Setting Detection Interval: 400 ms

## 2013-05-04 LAB — POCT INR: INR: 2.5

## 2013-05-04 NOTE — Progress Notes (Signed)
Interrogation only for battery longevity.  ROV in January with Dr. Caryl Comes.

## 2013-05-16 ENCOUNTER — Encounter: Payer: Self-pay | Admitting: *Deleted

## 2013-06-03 ENCOUNTER — Encounter: Payer: Self-pay | Admitting: Internal Medicine

## 2013-06-07 ENCOUNTER — Encounter: Payer: Self-pay | Admitting: *Deleted

## 2013-06-07 ENCOUNTER — Ambulatory Visit (INDEPENDENT_AMBULATORY_CARE_PROVIDER_SITE_OTHER): Payer: Medicare Other | Admitting: Internal Medicine

## 2013-06-07 ENCOUNTER — Encounter: Payer: Self-pay | Admitting: Internal Medicine

## 2013-06-07 VITALS — BP 155/81 | HR 66 | Ht 63.0 in | Wt 101.0 lb

## 2013-06-07 DIAGNOSIS — I442 Atrioventricular block, complete: Secondary | ICD-10-CM | POA: Diagnosis not present

## 2013-06-07 DIAGNOSIS — I4891 Unspecified atrial fibrillation: Secondary | ICD-10-CM

## 2013-06-07 DIAGNOSIS — Z950 Presence of cardiac pacemaker: Secondary | ICD-10-CM

## 2013-06-07 LAB — MDC_IDC_ENUM_SESS_TYPE_INCLINIC
Battery Voltage: 2.83 V
Brady Statistic RV Percent Paced: 99.91 %
Date Time Interrogation Session: 20150120153354
Lead Channel Impedance Value: 432 Ohm
Lead Channel Impedance Value: 600 Ohm
Lead Channel Pacing Threshold Amplitude: 0.5 V
Lead Channel Pacing Threshold Pulse Width: 0.4 ms
Lead Channel Sensing Intrinsic Amplitude: 1.9825
Lead Channel Setting Pacing Amplitude: 2.5 V
Lead Channel Setting Pacing Pulse Width: 0.4 ms
Lead Channel Setting Sensing Sensitivity: 0.9 mV
Zone Setting Detection Interval: 350 ms
Zone Setting Detection Interval: 400 ms

## 2013-06-07 NOTE — Assessment & Plan Note (Signed)
Stable post pacing 

## 2013-06-07 NOTE — Assessment & Plan Note (Signed)
Permanent atrial fibrillation. She had a TIA hence the we will stop her aspirin. Her INRs target is appropriately 2.5 aspirin. I have reviewed with her current guidelines.

## 2013-06-07 NOTE — Assessment & Plan Note (Signed)
Approaching ERI. We have discussed generator replacement the potential risks

## 2013-06-07 NOTE — Patient Instructions (Signed)
Your physician has recommended you make the following change in your medication:  1) Stop Aspirin  Your physician recommends that you schedule a follow-up appointment in: 1 months with device clinic.  Your physician wants you to follow-up in: 1 year with Dr. Caryl Comes.  You will receive a reminder letter in the mail two months in advance. If you don't receive a letter, please call our office to schedule the follow-up appointment.

## 2013-06-07 NOTE — Progress Notes (Signed)
      Patient Care Team: Gara Kroner, MD as PCP - General (Family Medicine)   HPI  [name] is a 80 y.o. female Seen for pacemaker followup. She previously been seen by cardiology. Dr. in the event that her pacemaker. This had been undertaken for tachybradycardia syndrome. 2012 she underwent AV junction ablation by Dr. Lovena Le.  Thromboembolic risk profile includes prior TIA-2, hypertension-1, diabetes-1, gender-1, age-87 for a CHADS-VASc score of 7  The patient denies chest pain, shortness of breath, nocturnal dyspnea, orthopnea or peripheral edema.  There have been no palpitations, lightheadedness or syncope.    Past Medical History  Diagnosis Date  . Pacemaker-Medtronic 06/07/2013  . Atrial fibrillation 02/24/2013  . Atrioventricular block, complete s/p AV ablation 06/07/2013    History reviewed. No pertinent past surgical history.  Current Outpatient Prescriptions  Medication Sig Dispense Refill  . aspirin 81 MG tablet Take 81 mg by mouth daily.      Marland Kitchen atorvastatin (LIPITOR) 10 MG tablet Take 10 mg by mouth. 1/2 tablet daily      . COUMADIN 1 MG tablet Take 1 tablet (1 mg total) by mouth as directed.  80 tablet  3  . escitalopram (LEXAPRO) 10 MG tablet Take 10 mg by mouth daily.      . eszopiclone (LUNESTA) 2 MG TABS tablet Take 2 mg by mouth as needed. Take immediately before bedtime      . LORazepam (ATIVAN) 1 MG tablet Take 1 mg by mouth as needed for anxiety.      . metFORMIN (GLUCOPHAGE) 1000 MG tablet Take 1,000 mg by mouth 2 (two) times daily with a meal.      . Multiple Vitamin (MULTIVITAMIN) tablet Take 1 tablet by mouth daily.      . pregabalin (LYRICA) 25 MG capsule Take one capsule by mouth daily  30 capsule  11  . telmisartan (MICARDIS) 80 MG tablet Take 80 mg by mouth daily.       No current facility-administered medications for this visit.    Allergies  Allergen Reactions  . Tikosyn [Dofetilide]     Mouth swells, itching    Review of Systems  negative except from HPI and PMH  Physical Exam BP 155/81  Pulse 66  Ht 5\' 3"  (1.6 m)  Wt 101 lb (45.813 kg)  BMI 17.90 kg/m2 Well developed and cachectic  in no acute distress HENT normal E scleral and icterus clear Neck Supple JVP flat; carotids brisk and full Clear to ausculation  Regular rate and rhythm, no murmurs gallops or rub Soft with active bowel sounds No clubbing cyanosis none Edema Alert and oriented, grossly normal motor and sensory function Skin Warm and Dry    Assessment and  Plan

## 2013-06-15 ENCOUNTER — Ambulatory Visit (INDEPENDENT_AMBULATORY_CARE_PROVIDER_SITE_OTHER): Payer: Medicare Other | Admitting: *Deleted

## 2013-06-15 DIAGNOSIS — I4891 Unspecified atrial fibrillation: Secondary | ICD-10-CM

## 2013-06-15 DIAGNOSIS — Z5181 Encounter for therapeutic drug level monitoring: Secondary | ICD-10-CM | POA: Insufficient documentation

## 2013-06-15 LAB — POCT INR: INR: 2.8

## 2013-06-16 ENCOUNTER — Encounter: Payer: Self-pay | Admitting: Internal Medicine

## 2013-06-20 ENCOUNTER — Ambulatory Visit: Payer: Medicare Other | Admitting: Cardiology

## 2013-06-20 ENCOUNTER — Ambulatory Visit (INDEPENDENT_AMBULATORY_CARE_PROVIDER_SITE_OTHER): Payer: Medicare Other | Admitting: Cardiology

## 2013-06-20 ENCOUNTER — Encounter: Payer: Self-pay | Admitting: Cardiology

## 2013-06-20 VITALS — BP 164/70 | HR 71 | Ht 63.0 in | Wt 100.0 lb

## 2013-06-20 DIAGNOSIS — I4891 Unspecified atrial fibrillation: Secondary | ICD-10-CM | POA: Diagnosis not present

## 2013-06-20 DIAGNOSIS — Z950 Presence of cardiac pacemaker: Secondary | ICD-10-CM

## 2013-06-20 DIAGNOSIS — I442 Atrioventricular block, complete: Secondary | ICD-10-CM | POA: Diagnosis not present

## 2013-06-20 DIAGNOSIS — Z5181 Encounter for therapeutic drug level monitoring: Secondary | ICD-10-CM | POA: Diagnosis not present

## 2013-06-20 NOTE — Progress Notes (Signed)
[facility]. [address], [address] [place], [place]  [address] Phone: [phone] Fax:  [phone]  Date:  06/20/2013   ID:  LAIKLYN CHESMORE, DOB [birth year], MRN [id number]  PCP:  Gara Kroner, MD   History of Present Illness: [name] is a 80 y.o. female with history of permanent atrial fibrillation, status post AV nodal ablation in 2010 for persistent symptoms. Diabetes, EF 45% likely from chronic pacing. Septal wall hypokinesis noted a nuclear stress test which actually showed an EF of 64%. Her pacemaker has been functioning normally. Underlying atrial fibrillation noted.  Battery life noted. She's concerned about this. She feels as though after she had 80 years old that her age starts to catch up with her.  Having trouble with diabetic diet and coumadin. Used to enjoy greens. Still continues to smoke.   Wt Readings from Last 3 Encounters:  06/20/13 100 lb (45.36 kg)  06/07/13 101 lb (45.813 kg)     Past Medical History  Diagnosis Date  . Pacemaker-Medtronic 06/07/2013  . Atrial fibrillation 02/24/2013  . Atrioventricular block, complete s/p AV ablation 06/07/2013  . TIA (transient ischemic attack)   . Diabetes mellitus without complication     Past Surgical History  Procedure Laterality Date  . Cyst removal trunk      from breast  . Cateracts      eyes    Current Outpatient Prescriptions  Medication Sig Dispense Refill  . atorvastatin (LIPITOR) 10 MG tablet Take 10 mg by mouth. 1/2 tablet daily      . COUMADIN 1 MG tablet Take 1 tablet (1 mg total) by mouth as directed.  80 tablet  3  . escitalopram (LEXAPRO) 10 MG tablet Take 10 mg by mouth daily.      . eszopiclone (LUNESTA) 2 MG TABS tablet Take 2 mg by mouth as needed. Take immediately before bedtime      . LORazepam (ATIVAN) 1 MG tablet Take 1 mg by mouth as needed for anxiety.      . metFORMIN (GLUCOPHAGE) 1000 MG tablet Take 1,000 mg by mouth 2 (two) times daily with a meal.      . Multiple Vitamin  (MULTIVITAMIN) tablet Take 1 tablet by mouth daily.      . pregabalin (LYRICA) 25 MG capsule Take one capsule by mouth daily  30 capsule  11  . telmisartan (MICARDIS) 80 MG tablet Take 80 mg by mouth daily.       No current facility-administered medications for this visit.    Allergies:    Allergies  Allergen Reactions  . Tikosyn [Dofetilide]     Mouth swells, itching    Social History:  The patient  reports that she has been smoking.  She does not have any smokeless tobacco history on file. She reports that she does not drink alcohol or use illicit drugs.   ROS:  Please see the history of present illness.   Denies any bleeding, syncope, new strokelike symptoms. No dizziness, no chest pain. She does have left foot pain, 2 weeks, dropped something on her foot. She is going to see orthopedist about this.    PHYSICAL EXAM: VS:  BP 164/70  Pulse 71  Ht 5\' 3"  (1.6 m)  Wt 100 lb (45.36 kg)  BMI 17.72 kg/m2 Thin, in no acute distress HEENT: normal Neck: no JVD Cardiac:  normal S1, S2; RRR; no murmur Lungs:  clear to auscultation bilaterally, no wheezing, rhonchi or rales Abd: soft,  nontender, no hepatomegaly Ext: no edema Skin: warm and dry Neuro: no focal abnormalities noted  EKG:  Atrial fibrillation rate 71, ventricular pacing.     ASSESSMENT AND PLAN:  1. Atrial fibrillation- CHADS-VASc 5. Chronic Coumadin. Off of her aspirin. AV nodal ablation. 2. Pacemaker-reviewed office note on 06/07/13 from Dr. Caryl Comes. Approaching ERI 3. Tobacco use-encourage cessation. She does not wish to quit. 4. AV block/AV nodal ablation-permanent pacemaker required. Pacemaker dependent  Signed, Candee Furbish, MD [facility]  06/20/2013 9:13 AM

## 2013-06-20 NOTE — Patient Instructions (Addendum)
Your physician recommends that you continue on your current medications as directed. Please refer to the Current Medication list given to you today.  Your physician wants you to follow-up in: 6 months with Dr. Marlou Porch. You will receive a reminder letter in the mail two months in advance. If you don't receive a letter, please call our office to schedule the follow-up appointment.  Vitamin K and Warfarin Warfarin is a drug that helps thin your blood. If you take warfarin, you will need to follow a diet that has a consistent amount of vitamin K-containing foods. Sudden changes in the amount of vitamin K that you eat can cause the medicine to not work as well as it should. You do not need to avoid vitamin K-containing foods. FOODS HIGH IN VITAMIN K  Broccoli, fresh or frozen, cooked, 1 cup  Greens, fresh or frozen, cooked (beet, collard, mustard, turnip),  cup  Kale, fresh or frozen, cooked,  cup  Parsley, raw, 10 sprigs  Spinach, frozen or canned, cooked,  cup FOODS MODERATELY HIGH IN VITAMIN K  Bok choy, cooked, 1 cup  Broccoli, raw, 1 cup  Brussels sprouts, fresh or frozen, cooked,  cup  Cabbage, cooked, 1 cup  Endive, raw, 1 cup  Green leaf lettuce, raw, 1 cup  Green scallions, raw,  cup  Okra, frozen, cooked, 1 cup  Romaine lettuce, raw, 1 cup  Sauerkraut, canned, 1 cup  Spinach, raw, 1 cup KEEPING YOUR INTAKE CONSISTENT  Note the foods that are high in vitamin K listed above. How many times per week do you eat these foods?  For example, you might eat cooked broccoli 1 time a week and a leafy green salad 3 times a week. In that case, you should have 1 high vitamin K food each week and 3 moderately high foods each week.  Remember, you do not need to eat the same foods each week. You do need to keep your vitamin K intake levels the same. Notify your caregiver before changing your diet. MORE TIPS  Take your warfarin as instructed.  It is okay to take a  multivitamin that contains vitamin K. Just be sure to take it every day.  Discuss any supplements or whole food supplements with your pharmacist. Document Released: 03/02/2009 Document Revised: 11/04/2011 Document Reviewed: 03/02/2009 ExitCare Patient Information 2014 [place], [place].

## 2013-06-21 DIAGNOSIS — S92919A Unspecified fracture of unspecified toe(s), initial encounter for closed fracture: Secondary | ICD-10-CM | POA: Diagnosis not present

## 2013-06-27 DIAGNOSIS — E782 Mixed hyperlipidemia: Secondary | ICD-10-CM | POA: Diagnosis not present

## 2013-06-27 DIAGNOSIS — F411 Generalized anxiety disorder: Secondary | ICD-10-CM | POA: Diagnosis not present

## 2013-06-27 DIAGNOSIS — I10 Essential (primary) hypertension: Secondary | ICD-10-CM | POA: Diagnosis not present

## 2013-06-27 DIAGNOSIS — E1149 Type 2 diabetes mellitus with other diabetic neurological complication: Secondary | ICD-10-CM | POA: Diagnosis not present

## 2013-06-27 DIAGNOSIS — Z23 Encounter for immunization: Secondary | ICD-10-CM | POA: Diagnosis not present

## 2013-06-27 DIAGNOSIS — N183 Chronic kidney disease, stage 3 unspecified: Secondary | ICD-10-CM | POA: Diagnosis not present

## 2013-06-27 DIAGNOSIS — I4891 Unspecified atrial fibrillation: Secondary | ICD-10-CM | POA: Diagnosis not present

## 2013-06-27 DIAGNOSIS — Z0000 Encounter for general adult medical examination without abnormal findings: Secondary | ICD-10-CM | POA: Diagnosis not present

## 2013-06-27 DIAGNOSIS — G4700 Insomnia, unspecified: Secondary | ICD-10-CM | POA: Diagnosis not present

## 2013-07-08 DIAGNOSIS — Z961 Presence of intraocular lens: Secondary | ICD-10-CM | POA: Diagnosis not present

## 2013-07-08 DIAGNOSIS — H43819 Vitreous degeneration, unspecified eye: Secondary | ICD-10-CM | POA: Diagnosis not present

## 2013-07-08 DIAGNOSIS — H353190 Nonexudative age-related macular degeneration, unspecified eye, stage unspecified: Secondary | ICD-10-CM | POA: Diagnosis not present

## 2013-07-08 DIAGNOSIS — E119 Type 2 diabetes mellitus without complications: Secondary | ICD-10-CM | POA: Diagnosis not present

## 2013-07-11 ENCOUNTER — Ambulatory Visit (INDEPENDENT_AMBULATORY_CARE_PROVIDER_SITE_OTHER): Payer: Medicare Other | Admitting: *Deleted

## 2013-07-11 ENCOUNTER — Encounter: Payer: Self-pay | Admitting: *Deleted

## 2013-07-11 ENCOUNTER — Other Ambulatory Visit: Payer: Self-pay | Admitting: *Deleted

## 2013-07-11 ENCOUNTER — Telehealth: Payer: Self-pay | Admitting: *Deleted

## 2013-07-11 ENCOUNTER — Encounter (HOSPITAL_COMMUNITY): Payer: Self-pay | Admitting: Pharmacy Technician

## 2013-07-11 DIAGNOSIS — Z01812 Encounter for preprocedural laboratory examination: Secondary | ICD-10-CM

## 2013-07-11 DIAGNOSIS — I4891 Unspecified atrial fibrillation: Secondary | ICD-10-CM | POA: Diagnosis not present

## 2013-07-11 DIAGNOSIS — I442 Atrioventricular block, complete: Secondary | ICD-10-CM | POA: Diagnosis not present

## 2013-07-11 DIAGNOSIS — Z950 Presence of cardiac pacemaker: Secondary | ICD-10-CM

## 2013-07-11 LAB — CBC WITH DIFFERENTIAL/PLATELET
Basophils Absolute: 0.1 10*3/uL (ref 0.0–0.1)
Basophils Relative: 0.6 % (ref 0.0–3.0)
Eosinophils Absolute: 0.2 10*3/uL (ref 0.0–0.7)
Eosinophils Relative: 1.7 % (ref 0.0–5.0)
HCT: 44.7 % (ref 36.0–46.0)
Hemoglobin: 14.3 g/dL (ref 12.0–15.0)
Lymphocytes Relative: 20.7 % (ref 12.0–46.0)
Lymphs Abs: 2.3 10*3/uL (ref 0.7–4.0)
MCHC: 32 g/dL (ref 30.0–36.0)
MCV: 95.3 fl (ref 78.0–100.0)
Monocytes Absolute: 0.8 10*3/uL (ref 0.1–1.0)
Monocytes Relative: 7.4 % (ref 3.0–12.0)
Neutro Abs: 7.7 10*3/uL (ref 1.4–7.7)
Neutrophils Relative %: 69.6 % (ref 43.0–77.0)
Platelets: 229 10*3/uL (ref 150.0–400.0)
RBC: 4.69 Mil/uL (ref 3.87–5.11)
RDW: 14.6 % (ref 11.5–14.6)
WBC: 11.1 10*3/uL — ABNORMAL HIGH (ref 4.5–10.5)

## 2013-07-11 LAB — MDC_IDC_ENUM_SESS_TYPE_INCLINIC
Battery Voltage: 2.69 V
Brady Statistic RV Percent Paced: 99.99 %
Date Time Interrogation Session: 20150223115335
Lead Channel Impedance Value: 488 Ohm
Lead Channel Impedance Value: 624 Ohm
Lead Channel Setting Pacing Amplitude: 2.5 V
Lead Channel Setting Pacing Pulse Width: 0.4 ms
Lead Channel Setting Sensing Sensitivity: 0.9 mV
Zone Setting Detection Interval: 350 ms
Zone Setting Detection Interval: 400 ms

## 2013-07-11 LAB — PROTIME-INR
INR: 4 ratio — ABNORMAL HIGH (ref 0.8–1.0)
Prothrombin Time: 41 s — ABNORMAL HIGH (ref 10.2–12.4)

## 2013-07-11 NOTE — Progress Notes (Signed)
PPM interrogation only for battery longevity.  ERI reached 07/05/13 and measures 2.69 today which is EOL.  Patient to be scheduled for change out.

## 2013-07-11 NOTE — Telephone Encounter (Signed)
Scheduled wound check appt for 3/16 at 2:30 pm. Pt agreeable to plan.

## 2013-07-11 NOTE — Addendum Note (Signed)
Addended by: Eulis Foster on: 07/11/2013 12:31 PM   Modules accepted: Orders

## 2013-07-13 ENCOUNTER — Other Ambulatory Visit: Payer: Self-pay | Admitting: Internal Medicine

## 2013-07-13 DIAGNOSIS — I498 Other specified cardiac arrhythmias: Secondary | ICD-10-CM

## 2013-07-13 LAB — BASIC METABOLIC PANEL
BUN: 21 mg/dL (ref 6–23)
CO2: 28 mEq/L (ref 19–32)
Calcium: 9.6 mg/dL (ref 8.4–10.5)
Chloride: 102 mEq/L (ref 96–112)
Creatinine, Ser: 1.1 mg/dL (ref 0.4–1.2)
GFR: 52.14 mL/min — ABNORMAL LOW (ref >60.00)
Glucose, Bld: 130 mg/dL — ABNORMAL HIGH (ref 70–99)
Potassium: 4.4 mEq/L (ref 3.5–5.1)
Sodium: 138 mEq/L (ref 135–145)

## 2013-07-15 ENCOUNTER — Ambulatory Visit (INDEPENDENT_AMBULATORY_CARE_PROVIDER_SITE_OTHER): Payer: Medicare Other | Admitting: Pharmacist

## 2013-07-15 DIAGNOSIS — Z5181 Encounter for therapeutic drug level monitoring: Secondary | ICD-10-CM

## 2013-07-15 DIAGNOSIS — I4891 Unspecified atrial fibrillation: Secondary | ICD-10-CM

## 2013-07-17 DIAGNOSIS — Z45018 Encounter for adjustment and management of other part of cardiac pacemaker: Secondary | ICD-10-CM | POA: Diagnosis not present

## 2013-07-17 DIAGNOSIS — I4891 Unspecified atrial fibrillation: Secondary | ICD-10-CM | POA: Diagnosis not present

## 2013-07-17 DIAGNOSIS — I495 Sick sinus syndrome: Secondary | ICD-10-CM | POA: Diagnosis not present

## 2013-07-17 DIAGNOSIS — F411 Generalized anxiety disorder: Secondary | ICD-10-CM | POA: Diagnosis not present

## 2013-07-17 DIAGNOSIS — I442 Atrioventricular block, complete: Secondary | ICD-10-CM | POA: Diagnosis not present

## 2013-07-17 DIAGNOSIS — E119 Type 2 diabetes mellitus without complications: Secondary | ICD-10-CM | POA: Diagnosis not present

## 2013-07-17 DIAGNOSIS — Z8673 Personal history of transient ischemic attack (TIA), and cerebral infarction without residual deficits: Secondary | ICD-10-CM | POA: Diagnosis not present

## 2013-07-17 MED ORDER — SODIUM CHLORIDE 0.9 % IR SOLN
80.0000 mg | Status: DC
  Filled 2013-07-17: qty 2

## 2013-07-17 MED ORDER — CEFAZOLIN SODIUM-DEXTROSE 2-3 GM-% IV SOLR: 2.0000 g | INTRAVENOUS | Status: DC

## 2013-07-18 ENCOUNTER — Encounter (HOSPITAL_COMMUNITY)
Admission: RE | Disposition: A | Discharge status: Home / Self Care | Payer: Self-pay | Source: Ambulatory Visit | Attending: Internal Medicine

## 2013-07-18 ENCOUNTER — Ambulatory Visit (HOSPITAL_COMMUNITY)
Admission: RE | Admit: 2013-07-18 | Discharge: 2013-07-18 | Disposition: A | Discharge status: Home / Self Care | Payer: Medicare Other | Source: Ambulatory Visit | Attending: Internal Medicine | Admitting: Internal Medicine

## 2013-07-18 DIAGNOSIS — E119 Type 2 diabetes mellitus without complications: Secondary | ICD-10-CM | POA: Diagnosis not present

## 2013-07-18 DIAGNOSIS — I442 Atrioventricular block, complete: Secondary | ICD-10-CM | POA: Diagnosis not present

## 2013-07-18 DIAGNOSIS — I495 Sick sinus syndrome: Secondary | ICD-10-CM | POA: Diagnosis not present

## 2013-07-18 DIAGNOSIS — Z45018 Encounter for adjustment and management of other part of cardiac pacemaker: Secondary | ICD-10-CM | POA: Diagnosis not present

## 2013-07-18 DIAGNOSIS — Z8673 Personal history of transient ischemic attack (TIA), and cerebral infarction without residual deficits: Secondary | ICD-10-CM | POA: Insufficient documentation

## 2013-07-18 DIAGNOSIS — F411 Generalized anxiety disorder: Secondary | ICD-10-CM | POA: Insufficient documentation

## 2013-07-18 DIAGNOSIS — I4891 Unspecified atrial fibrillation: Secondary | ICD-10-CM | POA: Diagnosis not present

## 2013-07-18 DIAGNOSIS — I498 Other specified cardiac arrhythmias: Secondary | ICD-10-CM

## 2013-07-18 HISTORY — PX: PERMANENT PACEMAKER GENERATOR CHANGE: SHX6022

## 2013-07-18 LAB — GLUCOSE, CAPILLARY: Glucose-Capillary: 139 mg/dL — ABNORMAL HIGH (ref 70–99)

## 2013-07-18 LAB — PROTIME-INR
INR: 1.32 (ref 0.00–1.49)
Prothrombin Time: 16.1 seconds — ABNORMAL HIGH (ref 11.6–15.2)

## 2013-07-18 LAB — SURGICAL PCR SCREEN
MRSA, PCR: NEGATIVE
Staphylococcus aureus: NEGATIVE

## 2013-07-18 SURGERY — PERMANENT PACEMAKER GENERATOR CHANGE
Anesthesia: LOCAL

## 2013-07-18 MED ORDER — CHLORHEXIDINE GLUCONATE 4 % EX LIQD
60.0000 mL | Freq: Once | CUTANEOUS | Status: DC
  Filled 2013-07-18: qty 60

## 2013-07-18 MED ORDER — SODIUM CHLORIDE 0.9 % IV SOLN: INTRAVENOUS | Status: AC

## 2013-07-18 MED ORDER — MIDAZOLAM HCL 5 MG/5ML IJ SOLN
INTRAMUSCULAR | Status: AC
  Filled 2013-07-18: qty 5

## 2013-07-18 MED ORDER — SODIUM CHLORIDE 0.9 % IV SOLN
INTRAVENOUS | Status: DC
  Administered 2013-07-18: 08:00:00 via INTRAVENOUS

## 2013-07-18 MED ORDER — ACETAMINOPHEN 325 MG PO TABS: 325.0000 mg | ORAL_TABLET | ORAL | Status: DC | PRN

## 2013-07-18 MED ORDER — LIDOCAINE HCL (PF) 1 % IJ SOLN
INTRAMUSCULAR | Status: AC
  Filled 2013-07-18: qty 60

## 2013-07-18 MED ORDER — ONDANSETRON HCL 4 MG/2ML IJ SOLN: 4.0000 mg | Freq: Four times a day (QID) | INTRAMUSCULAR | Status: DC | PRN

## 2013-07-18 MED ORDER — MUPIROCIN 2 % EX OINT
TOPICAL_OINTMENT | Freq: Two times a day (BID) | CUTANEOUS | Status: DC
  Filled 2013-07-18 (×2): qty 22

## 2013-07-18 MED ORDER — FENTANYL CITRATE 0.05 MG/ML IJ SOLN
INTRAMUSCULAR | Status: AC
  Filled 2013-07-18: qty 2

## 2013-07-18 MED ORDER — CEFAZOLIN SODIUM-DEXTROSE 2-3 GM-% IV SOLR
INTRAVENOUS | Status: AC
  Filled 2013-07-18: qty 50

## 2013-07-18 NOTE — CV Procedure (Signed)
Preoperative diagnosis  Tachybraduy, complete heart block device dependent and previous device at [facility] Postoperative diagnosis same/   Procedure: Generator replacement     Following informed consent the patient was brought to the electrophysiology laboratory in place of the fluoroscopic table in the supine position after routine prep and drape lidocaine was infiltrated in the region of the previous incision and carried down to later the device pocket using sharp dissection and electrocautery. The pocket was opened the device was freed up and was explanted.  Interrogation of the previously implanted ventricular lead Medtronic 4076  demonstrated an R wave of NO  millivolts., and impedance of 441 ohms, and a pacing threshold of 0.2 volts at 0.5 msec.    The previously implanted atrial lead was not evaluated  s.  The leads were inspected. The leads were then attached to a Medtronic AdaptaL pulse generator, serial number [id number].    The pocket was irrigated with antibiotic containing saline solution hemostasis was assured and the leads and the device were placed in the pocket. The wound was then closed in 3 layers in normal fashion.  dERMABOND DRESSING WAS APPLIED  The patient tolerated the procedure without apparent complication.  Virl Axe

## 2013-07-18 NOTE — Discharge Instructions (Signed)
Pacemaker Battery Change, Care After  °Refer to this sheet in the next few weeks. These instructions provide you with information on caring for yourself after your procedure. Your health care provider may also give you more specific instructions. Your treatment has been planned according to current medical practices, but problems sometimes occur. Call your health care provider if you have any problems or questions after your procedure. °WHAT TO EXPECT AFTER THE PROCEDURE °After your procedure, it is typical to have the following sensations: °· Soreness at the pacemaker site. °HOME CARE INSTRUCTIONS  °· Keep the incision clean and dry. °· Unless advised otherwise, you may shower beginning 48 hours after your procedure. °· For the first week after the replacement, avoid stretching motions that pull at the incision site and avoid heavy exercise with the arm on the same side as the incision. °· Only take over-the-counter or prescription medicines for pain, discomfort, or fever as directed by your health care provider. °· Your health care provider will tell you when you will need to next test your pacemaker by telephone or when to return to the office for follow up for removal of stitches. °SEEK MEDICAL CARE IF:  °· You have pain at the incision site that is not relieved by over-the-counter or prescription medicine. °· There is drainage or pus from the incision site. °· There is swelling larger than a lime at the incision site. °· You develop red streaking that extends above or below the incision site. °· You feel brief, intermittent palpitations, lightheadedness, or any symptoms that you feel might be related to your heart. °SEEK IMMEDIATE MEDICAL CARE IF:  °· You experience chest pain that is different than the pain at the pacemaker site. °· Shortness of breath. °· Palpitations or irregular heart beat. °· Lightheadedness that does not go away quickly. °· Fainting. °· You have pain that gets worse and is not relieved by  medicine. °MAKE SURE YOU:  °· Understand these instructions. °· Will watch your condition. °· Will get help right away if you are not doing well or get worse. °Document Released: 02/23/2013 Document Reviewed: 11/17/2012 °ExitCare® Patient Information ©2014 [employer]. ° °

## 2013-07-18 NOTE — H&P (Signed)
       Patient Care Team: Gara Kroner, MD as PCP - General (Family Medicine)   HPI  [name] is a 80 y.o. female For pacemaker generator replacement for tachybradysyndrome and is s/p AV ablation  The patient denies chest pain, shortness of breath, nocturnal dyspnea, orthopnea or peripheral edema.  There have been no palpitations, lightheadedness or syncope.  Some anxiety re change out  Coumadin held because of INR 4.0 and chadsvas 7   Past Medical History  Diagnosis Date  . Pacemaker-Medtronic 06/07/2013  . Atrial fibrillation 02/24/2013  . Atrioventricular block, complete s/p AV ablation 06/07/2013  . TIA (transient ischemic attack)   . Diabetes mellitus without complication     Past Surgical History  Procedure Laterality Date  . Cyst removal trunk      from breast  . Cateracts      eyes    Current Facility-Administered Medications  Medication Dose Route Frequency Provider Last Rate Last Dose  . 0.9 %  sodium chloride infusion   Intravenous Continuous Deboraha Sprang, MD 50 mL/hr at 07/18/13 0805    . ceFAZolin (ANCEF) IVPB 2 g/50 mL premix  2 g Intravenous On Call Deboraha Sprang, MD      . chlorhexidine (HIBICLENS) 4 % liquid 4 application  60 mL Topical Once Deboraha Sprang, MD      . gentamicin (GARAMYCIN) 80 mg in sodium chloride irrigation 0.9 % 500 mL irrigation  80 mg Irrigation On Call Deboraha Sprang, MD      . mupirocin ointment Drue Stager) 2 %   Nasal BID Deboraha Sprang, MD        Allergies  Allergen Reactions  . Tikosyn [Dofetilide]     Mouth swells, itching    Review of Systems negative except from HPI and PMH  Physical Exam BP 155/67  Pulse 64  Temp(Src) 97.6 F (36.4 C) (Oral)  Resp 16  Ht 5\' 3"  (1.6 m)  Wt 100 lb (45.36 kg)  BMI 17.72 kg/m2  SpO2 96% Well developed and well nourished in no acute distress HENT normal E scleral and icterus clear Neck Supple Device pocket well healed; without hematoma or erythema.  There is no  tethering  JVP flat; carotids brisk and full Clear to ausculation  Regular rate and rhythm, no murmurs gallops or rub Soft with active bowel sounds No clubbing cyanosis none Edema Alert and oriented, grossly normal motor and sensory function Skin Warm and Dry    Assessment and  Plan  For generator replacement  We have reviewed the benefits and risks of generator replacement.  These include but are not limited to lead fracture and infection.  The patient understands, agrees and is willing to proceed.     Other concern is subtherapeutic INR  Will continue coumadin tonight but avoid hep notwithstanding CHADS score 5

## 2013-07-26 ENCOUNTER — Encounter: Payer: Self-pay | Admitting: Podiatry

## 2013-07-26 ENCOUNTER — Ambulatory Visit (INDEPENDENT_AMBULATORY_CARE_PROVIDER_SITE_OTHER): Payer: Medicare Other | Admitting: Podiatry

## 2013-07-26 ENCOUNTER — Encounter (HOSPITAL_COMMUNITY): Payer: Self-pay | Admitting: *Deleted

## 2013-07-26 VITALS — BP 136/85 | HR 88 | Resp 18 | Ht 63.0 in | Wt 100.0 lb

## 2013-07-26 DIAGNOSIS — B351 Tinea unguium: Secondary | ICD-10-CM

## 2013-07-26 DIAGNOSIS — E1149 Type 2 diabetes mellitus with other diabetic neurological complication: Secondary | ICD-10-CM

## 2013-07-26 DIAGNOSIS — M79609 Pain in unspecified limb: Secondary | ICD-10-CM

## 2013-07-26 NOTE — Progress Notes (Signed)
   Subjective:    Patient ID: [name], female    DOB: Sep 16, 1928, 80 y.o.   MRN: [id number] Pt presents for debridement of B/L 1 - 5 toenails, pt complains of pin sticking in her feet especially at night. HPI    Review of Systems     Objective:   Physical Exam: Pulses are palpable. Vital signs are stable she is alert and oriented x3. Nails are thick yellow dystrophic with mycotic and painful palpation.        Assessment & Plan:  Assessment: Pain in limb secondary to onychomycosis 1 through 5 bilateral.  Plan: Debridement of nails 1 through 5 bilateral covered service secondary to pain .

## 2013-07-27 ENCOUNTER — Ambulatory Visit (INDEPENDENT_AMBULATORY_CARE_PROVIDER_SITE_OTHER): Payer: Medicare Other | Admitting: Pharmacist

## 2013-07-27 DIAGNOSIS — Z5181 Encounter for therapeutic drug level monitoring: Secondary | ICD-10-CM

## 2013-07-27 DIAGNOSIS — I4891 Unspecified atrial fibrillation: Secondary | ICD-10-CM

## 2013-07-27 LAB — POCT INR: INR: 2.4

## 2013-08-01 ENCOUNTER — Ambulatory Visit (INDEPENDENT_AMBULATORY_CARE_PROVIDER_SITE_OTHER): Payer: Medicare Other | Admitting: *Deleted

## 2013-08-01 DIAGNOSIS — I4891 Unspecified atrial fibrillation: Secondary | ICD-10-CM

## 2013-08-01 DIAGNOSIS — I442 Atrioventricular block, complete: Secondary | ICD-10-CM

## 2013-08-01 LAB — MDC_IDC_ENUM_SESS_TYPE_INCLINIC
Battery Impedance: 100 Ohm
Battery Remaining Longevity: 163 mo
Battery Voltage: 2.79 V
Brady Statistic RV Percent Paced: 100 %
Date Time Interrogation Session: 20150316144230
Lead Channel Impedance Value: 476 Ohm
Lead Channel Impedance Value: 67 Ohm
Lead Channel Pacing Threshold Amplitude: 0.5 V
Lead Channel Pacing Threshold Pulse Width: 0.4 ms
Lead Channel Setting Pacing Amplitude: 2.5 V
Lead Channel Setting Pacing Pulse Width: 0.4 ms
Lead Channel Setting Sensing Sensitivity: 2.8 mV

## 2013-08-01 NOTE — Progress Notes (Signed)
Wound check appointment. Steri-strips removed. Wound without redness or edema. Incision edges approximated, wound well healed. Normal device function. Thresholds, sensing, and impedances consistent with implant measurements. Device programmed at 3.5V/auto capture programmed on for extra safety margin until 3 month visit. Histogram distribution appropriate for patient and level of activity. No high ventricular rates noted. Patient educated about wound care, arm mobility, lifting restrictions. ROV in 3 months with Brooke.

## 2013-08-03 DIAGNOSIS — S92919A Unspecified fracture of unspecified toe(s), initial encounter for closed fracture: Secondary | ICD-10-CM | POA: Diagnosis not present

## 2013-08-09 DIAGNOSIS — M810 Age-related osteoporosis without current pathological fracture: Secondary | ICD-10-CM | POA: Diagnosis not present

## 2013-08-09 DIAGNOSIS — Z13820 Encounter for screening for osteoporosis: Secondary | ICD-10-CM | POA: Diagnosis not present

## 2013-08-17 ENCOUNTER — Ambulatory Visit (INDEPENDENT_AMBULATORY_CARE_PROVIDER_SITE_OTHER): Payer: Medicare Other | Admitting: *Deleted

## 2013-08-17 DIAGNOSIS — I4891 Unspecified atrial fibrillation: Secondary | ICD-10-CM | POA: Diagnosis not present

## 2013-08-17 DIAGNOSIS — Z5181 Encounter for therapeutic drug level monitoring: Secondary | ICD-10-CM

## 2013-08-17 LAB — POCT INR: INR: 2.7

## 2013-08-23 ENCOUNTER — Encounter: Payer: Self-pay | Admitting: Internal Medicine

## 2013-09-02 ENCOUNTER — Encounter: Payer: Self-pay | Admitting: Internal Medicine

## 2013-09-05 ENCOUNTER — Encounter: Payer: Self-pay | Admitting: Cardiology

## 2013-09-14 ENCOUNTER — Ambulatory Visit (INDEPENDENT_AMBULATORY_CARE_PROVIDER_SITE_OTHER): Payer: Medicare Other | Admitting: *Deleted

## 2013-09-14 DIAGNOSIS — I4891 Unspecified atrial fibrillation: Secondary | ICD-10-CM | POA: Diagnosis not present

## 2013-09-14 DIAGNOSIS — Z5181 Encounter for therapeutic drug level monitoring: Secondary | ICD-10-CM

## 2013-09-14 LAB — POCT INR: INR: 2.2

## 2013-10-12 ENCOUNTER — Ambulatory Visit (INDEPENDENT_AMBULATORY_CARE_PROVIDER_SITE_OTHER): Payer: Medicare Other | Admitting: *Deleted

## 2013-10-12 DIAGNOSIS — Z5181 Encounter for therapeutic drug level monitoring: Secondary | ICD-10-CM

## 2013-10-12 DIAGNOSIS — I4891 Unspecified atrial fibrillation: Secondary | ICD-10-CM

## 2013-10-12 LAB — POCT INR: INR: 2.3

## 2013-10-21 ENCOUNTER — Encounter: Payer: Medicare Other | Admitting: Cardiology

## 2013-10-25 ENCOUNTER — Encounter: Payer: Self-pay | Admitting: Cardiology

## 2013-10-25 ENCOUNTER — Ambulatory Visit (INDEPENDENT_AMBULATORY_CARE_PROVIDER_SITE_OTHER): Payer: Medicare Other | Admitting: Cardiology

## 2013-10-25 VITALS — BP 128/80 | HR 84 | Wt 92.0 lb

## 2013-10-25 DIAGNOSIS — I4891 Unspecified atrial fibrillation: Secondary | ICD-10-CM | POA: Diagnosis not present

## 2013-10-25 DIAGNOSIS — Z950 Presence of cardiac pacemaker: Secondary | ICD-10-CM

## 2013-10-25 DIAGNOSIS — Z9889 Other specified postprocedural states: Secondary | ICD-10-CM

## 2013-10-25 DIAGNOSIS — R5381 Other malaise: Secondary | ICD-10-CM | POA: Diagnosis not present

## 2013-10-25 DIAGNOSIS — R5383 Other fatigue: Secondary | ICD-10-CM

## 2013-10-25 DIAGNOSIS — R634 Abnormal weight loss: Secondary | ICD-10-CM | POA: Diagnosis not present

## 2013-10-25 DIAGNOSIS — I442 Atrioventricular block, complete: Secondary | ICD-10-CM

## 2013-10-25 DIAGNOSIS — I495 Sick sinus syndrome: Secondary | ICD-10-CM | POA: Diagnosis not present

## 2013-10-25 LAB — CBC WITH DIFFERENTIAL/PLATELET
Basophils Absolute: 0 10*3/uL (ref 0.0–0.1)
Basophils Relative: 0.6 % (ref 0.0–3.0)
Eosinophils Absolute: 0.1 10*3/uL (ref 0.0–0.7)
Eosinophils Relative: 1.3 % (ref 0.0–5.0)
HCT: 44.2 % (ref 36.0–46.0)
Hemoglobin: 14.8 g/dL (ref 12.0–15.0)
Lymphocytes Relative: 22.1 % (ref 12.0–46.0)
Lymphs Abs: 2 10*3/uL (ref 0.7–4.0)
MCHC: 33.4 g/dL (ref 30.0–36.0)
MCV: 92.4 fl (ref 78.0–100.0)
Monocytes Absolute: 0.7 10*3/uL (ref 0.1–1.0)
Monocytes Relative: 8.1 % (ref 3.0–12.0)
Neutro Abs: 6.1 10*3/uL (ref 1.4–7.7)
Neutrophils Relative %: 67.9 % (ref 43.0–77.0)
Platelets: 205 10*3/uL (ref 150.0–400.0)
RBC: 4.78 Mil/uL (ref 3.87–5.11)
RDW: 13.9 % (ref 11.5–15.5)
WBC: 8.9 10*3/uL (ref 4.0–10.5)

## 2013-10-25 LAB — MDC_IDC_ENUM_SESS_TYPE_INCLINIC
Battery Impedance: 100 Ohm
Battery Remaining Longevity: 146 mo
Battery Voltage: 2.79 V
Brady Statistic RV Percent Paced: 100 %
Date Time Interrogation Session: 20150609150431
Lead Channel Impedance Value: 487 Ohm
Lead Channel Pacing Threshold Amplitude: 0.75 V
Lead Channel Pacing Threshold Pulse Width: 0.4 ms
Lead Channel Setting Pacing Amplitude: 2.5 V
Lead Channel Setting Pacing Pulse Width: 0.4 ms
Lead Channel Setting Sensing Sensitivity: 2.8 mV

## 2013-10-25 LAB — TSH: TSH: 2.5 u[IU]/mL (ref 0.35–4.50)

## 2013-10-25 LAB — BASIC METABOLIC PANEL
BUN: 19 mg/dL (ref 6–23)
CO2: 27 mEq/L (ref 19–32)
Calcium: 10.3 mg/dL (ref 8.4–10.5)
Chloride: 100 mEq/L (ref 96–112)
Creatinine, Ser: 1.1 mg/dL (ref 0.4–1.2)
GFR: 49.94 mL/min — ABNORMAL LOW (ref >60.00)
Glucose, Bld: 121 mg/dL — ABNORMAL HIGH (ref 70–99)
Potassium: 4.7 mEq/L (ref 3.5–5.1)
Sodium: 138 mEq/L (ref 135–145)

## 2013-10-25 NOTE — Patient Instructions (Addendum)
Your physician recommends that you return for lab work in: [employer] wants you to follow-up in: [place], Gari Crown will receive a reminder letter in the mail two months in advance. If you don't receive a letter, please call our office to schedule the follow-up appointment.  Your physician recommends that you continue on your current medications as directed. Please refer to the Current Medication list given to you today.   YOUR PHYSICIAN RECOMMENDS YOU SEE DR. Marlou Porch  AUGUST 12/29/13 AT 10:00 AM

## 2013-10-27 NOTE — Progress Notes (Signed)
ELECTROPHYSIOLOGY OFFICE NOTE   Patient ID: [name] MRN: [id number], DOB/AGE: 06/09/1928   Date of Visit: 10/25/2013  Primary Physician: Gara Kroner, MD Primary Cardiologist: Marlou Porch, MD Primary EP: Caryl Comes, MD Reason for Visit: EP/device follow-up  History of Present Illness  [name] is a 80 y.o. female with tachy-brady syndrome s/p PPM implant. permanent atrial fibrillation s/p AV node ablation and TIA who underwent PPM generator change 3 months ago. She presents today for routine electrophysiology followup. Today she reports she has had fatigue and unintentional weight loss of 10 lbs in the last few weeks. She has not noticed any change in her appetite and feels she is eating and drinking as usual. She has no other complaints. She denies chest pain or shortness of breath. She denies palpitations, dizziness, near syncope or syncope.  She denies LE swelling, orthopnea or PND.   Past Medical History Past Medical History  Diagnosis Date  . Atrial fibrillation 02/24/2013  . TIA (transient ischemic attack)   . Diabetes mellitus without complication     Past Surgical History Past Surgical History  Procedure Laterality Date  . Cyst removal trunk      from breast  . Cateracts      eyes  . Pacemaker insertion  2008; 07/18/2013    MDT dual chamber pacemaker implanted 2008 by Dr Leonia Reeves; gen change 2015 by Dr Caryl Comes  . Ablation  2012    AVN ablation by Dr Lovena Le    Allergies/Intolerances Allergies  Allergen Reactions  . Tikosyn [Dofetilide]     Mouth swells, itching    Current Home Medications Current Outpatient Prescriptions  Medication Sig Dispense Refill  . atorvastatin (LIPITOR) 10 MG tablet Take 5 mg by mouth daily. 1/2 tablet daily      . Calcium Carbonate (CALCIUM 600 PO) Take 1 tablet by mouth 2 (two) times daily.      . Cholecalciferol (VITAMIN D3) 2000 UNITS TABS Take 1 tablet by mouth daily.      Marland Kitchen escitalopram (LEXAPRO) 10 MG tablet Take 10 mg by mouth daily.       . eszopiclone (LUNESTA) 2 MG TABS tablet Take 2 mg by mouth at bedtime as needed for sleep. Take immediately before bedtime      . LORazepam (ATIVAN) 1 MG tablet Take 1 mg by mouth at bedtime as needed for anxiety or sleep.       . metFORMIN (GLUCOPHAGE) 1000 MG tablet Take 500 mg by mouth 2 (two) times daily with a meal.       . Multiple Vitamin (MULTIVITAMIN) tablet Take 1 tablet by mouth daily.      . pregabalin (LYRICA) 25 MG capsule Take one capsule by mouth daily  30 capsule  11  . telmisartan (MICARDIS) 80 MG tablet Take 80 mg by mouth daily.      Marland Kitchen warfarin (COUMADIN) 1 MG tablet Take 2-3 mg by mouth daily. Takes 2 mg on Sunday, Tuesday and Thursday and 3 mg all other days       No current facility-administered medications for this visit.    Social History History   Social History  . Marital Status: Married    Spouse Name: N/A    Number of Children: N/A  . Years of Education: N/A   Occupational History  . Not on file.   Social History Main Topics  . Smoking status: Current Every Day Smoker  . Smokeless tobacco: Not on file  . Alcohol Use: No  . Drug  Use: No  . Sexual Activity: Not on file   Other Topics Concern  . Not on file   Social History Narrative  . No narrative on file     Review of Systems General: No chills, fever, night sweats or weight changes Cardiovascular: No chest pain, dyspnea on exertion, edema, orthopnea, palpitations, paroxysmal nocturnal dyspnea Dermatological: No rash, lesions or masses Respiratory: No cough, dyspnea Urologic: No hematuria, dysuria Abdominal: No nausea, vomiting, diarrhea, bright red blood per rectum, melena, or hematemesis Neurologic: No visual changes, weakness, changes in mental status All other systems reviewed and are otherwise negative except as noted above.  Physical Exam Vitals: Blood pressure 128/80, pulse 84, weight 92 lb (41.731 kg).  General: Well developed, well appearing 80 y.o. female in no acute  distress. HEENT: Normocephalic, atraumatic. EOMs intact. Sclera nonicteric. Oropharynx clear.  Neck: Supple. No JVD. Lungs: Respirations regular and unlabored, CTA bilaterally. No wheezes, rales or rhonchi. Heart: Regular. S1, S2 present. No murmurs, rub, S3 or S4. Abdomen: Soft, non-distended.  Extremities: No clubbing, cyanosis or edema. DP/PT/Radials 2+ and equal bilaterally. Psych: Normal affect. Neuro: Alert and oriented X 3. Moves all extremities spontaneously. Skin: Left upper chest / implant site intact and well healed.    Diagnostics Echocardiogram (most recent in EPIC is from 2008) LEFT VENTRICLE: - Left ventricular size was normal. - Overall left ventricular systolic function was normal. - Left ventricular ejection fraction was estimated to be 60 %. - There were no left ventricular regional wall motion abnormalities. - Left ventricular wall thickness was normal. Doppler interpretation(s): - There was a normal transmitral flow pattern. - The deceleration time of the early transmitral Doppler flow velocity was normal. - The pulmonary vein flow pattern was normal. - Left ventricular diastolic function parameters were normal. AORTIC VALVE: - The aortic valve was trileaflet. - Aortic valve thickness was mildly to moderately increased. Doppler interpretation(s): - There was no evidence for aortic valve stenosis. - There was no significant aortic valvular regurgitation. MITRAL VALVE: - Mitral valve structure was normal. Doppler interpretation(s): - There was mild mitral valvular regurgitation. LEFT ATRIUM: - Left atrial size was normal. RIGHT VENTRICLE: - Right ventricular size was normal. - Right ventricular systolic function was normal. - Right ventricular wall thickness was normal. PULMONIC VALVE: - The structure of the pulmonic valve appeared to be normal. Doppler interpretation(s): - The transpulmonic velocity was within the normal range. - There was no pulmonic  valve stenosis. - There was no significant pulmonic regurgitation. TRICUSPID VALVE: - The tricuspid valve structure was normal. Doppler interpretation(s): - There was mild tricuspid valvular regurgitation. RIGHT ATRIUM: - Right atrial size was normal. SYSTEMIC VEINS: - The inferior vena cava was normal. PERICARDIUM: - There was no pericardial effusion. - The pericardium was normal in appearance. --------------------------------------------------------------- SUMMARY - Overall left ventricular systolic function was normal. Left ventricular ejection fraction was estimated to be 60 %. There were no left ventricular regional wall motion abnormalities. Left ventricular diastolic function parameters were normal. - Aortic valve thickness was mildly to moderately increased. - There was mild mitral valvular regurgitation.  Device interrogation today - Normal pacemaker function. Sensing and impedance stable and consistent with previous measurements. Thresholds are stable and consistent with previous measurements. V paced 100%. Histograms are appropriate for patient activity level. No ventricular high rate episodes. No programming changes made. See PaceArt report for full details.   Assessment and Plan Fatigue and weight loss Tachy-brady syndrome s/p PPM implant, s/p recent PPM generator change Permanent atrial  fibrillation s/p AV node ablation  Ms. Yurko' pacemaker function is normal. Histograms are appropriate for activity level so I do not think her symptom of fatigue is related to heart rate. She will continue Coumadin for stroke prevention. I will order CBC, BMET and TSH. In addition, I instructed her to follow-up with her PCP for evaluation of fatigue and weight loss. She will return for follow-up as previously scheduled with Dr. Marlou Porch in 3 months. Enroll in [facility] for remote PPM follow-up every 3 months. She will return to see Dr. Caryl Comes in one year.  Manson Passey,  PA-C 10/27/2013, 11:37 PM

## 2013-10-28 DIAGNOSIS — G4700 Insomnia, unspecified: Secondary | ICD-10-CM | POA: Diagnosis not present

## 2013-10-28 DIAGNOSIS — F329 Major depressive disorder, single episode, unspecified: Secondary | ICD-10-CM | POA: Diagnosis not present

## 2013-10-28 DIAGNOSIS — F17200 Nicotine dependence, unspecified, uncomplicated: Secondary | ICD-10-CM | POA: Diagnosis not present

## 2013-10-28 DIAGNOSIS — E782 Mixed hyperlipidemia: Secondary | ICD-10-CM | POA: Diagnosis not present

## 2013-10-28 DIAGNOSIS — R634 Abnormal weight loss: Secondary | ICD-10-CM | POA: Diagnosis not present

## 2013-10-28 DIAGNOSIS — F411 Generalized anxiety disorder: Secondary | ICD-10-CM | POA: Diagnosis not present

## 2013-10-28 DIAGNOSIS — I10 Essential (primary) hypertension: Secondary | ICD-10-CM | POA: Diagnosis not present

## 2013-10-28 DIAGNOSIS — E1149 Type 2 diabetes mellitus with other diabetic neurological complication: Secondary | ICD-10-CM | POA: Diagnosis not present

## 2013-10-31 ENCOUNTER — Other Ambulatory Visit: Payer: Self-pay | Admitting: Family Medicine

## 2013-10-31 ENCOUNTER — Telehealth: Payer: Self-pay | Admitting: Cardiology

## 2013-10-31 DIAGNOSIS — R634 Abnormal weight loss: Secondary | ICD-10-CM

## 2013-10-31 NOTE — Telephone Encounter (Signed)
New message  Pt called states that her fam med provider changed her medication from eszopiclone to mirtazapine request a call back to discuss if it would hinder her Coumadin.

## 2013-10-31 NOTE — Telephone Encounter (Signed)
Pt called back and per Pharm D those sleep aids do not affect coumadin thus,no need for INR check.

## 2013-11-02 ENCOUNTER — Ambulatory Visit
Admission: RE | Admit: 2013-11-02 | Discharge: 2013-11-02 | Disposition: A | Discharge status: Home / Self Care | Payer: Medicare Other | Source: Ambulatory Visit | Attending: Family Medicine | Admitting: Family Medicine

## 2013-11-02 ENCOUNTER — Other Ambulatory Visit: Payer: Self-pay | Admitting: Family Medicine

## 2013-11-02 ENCOUNTER — Inpatient Hospital Stay: Admission: RE | Admit: 2013-11-02 | Payer: Medicare Other | Source: Ambulatory Visit

## 2013-11-02 DIAGNOSIS — N2889 Other specified disorders of kidney and ureter: Secondary | ICD-10-CM | POA: Diagnosis not present

## 2013-11-02 DIAGNOSIS — R634 Abnormal weight loss: Secondary | ICD-10-CM

## 2013-11-02 DIAGNOSIS — R9389 Abnormal findings on diagnostic imaging of other specified body structures: Secondary | ICD-10-CM | POA: Diagnosis not present

## 2013-11-02 MED ORDER — IOHEXOL 300 MG/ML  SOLN
100.0000 mL | Freq: Once | INTRAMUSCULAR | Status: AC | PRN
  Administered 2013-11-02: 100 mL via INTRAVENOUS

## 2013-11-07 ENCOUNTER — Other Ambulatory Visit: Payer: Self-pay | Admitting: Internal Medicine

## 2013-11-08 ENCOUNTER — Encounter: Payer: Self-pay | Admitting: Podiatry

## 2013-11-08 ENCOUNTER — Ambulatory Visit (INDEPENDENT_AMBULATORY_CARE_PROVIDER_SITE_OTHER): Payer: Medicare Other | Admitting: Podiatry

## 2013-11-08 VITALS — BP 124/79 | HR 78 | Resp 16

## 2013-11-08 DIAGNOSIS — E1149 Type 2 diabetes mellitus with other diabetic neurological complication: Secondary | ICD-10-CM

## 2013-11-08 DIAGNOSIS — B351 Tinea unguium: Secondary | ICD-10-CM | POA: Diagnosis not present

## 2013-11-08 DIAGNOSIS — M79609 Pain in unspecified limb: Secondary | ICD-10-CM | POA: Diagnosis not present

## 2013-11-08 DIAGNOSIS — M79606 Pain in leg, unspecified: Secondary | ICD-10-CM

## 2013-11-08 MED ORDER — PREGABALIN 25 MG PO CAPS: ORAL_CAPSULE | ORAL | Status: DC

## 2013-11-08 NOTE — Patient Instructions (Signed)
Diabetes and Foot Care Diabetes may cause you to have problems because of poor blood supply (circulation) to your feet and legs. This may cause the skin on your feet to become thinner, break easier, and heal more slowly. Your skin may become dry, and the skin may peel and crack. You may also have nerve damage in your legs and feet causing decreased feeling in them. You may not notice minor injuries to your feet that could lead to infections or more serious problems. Taking care of your feet is one of the most important things you can do for yourself.  HOME CARE INSTRUCTIONS  Wear shoes at all times, even in the house. Do not go barefoot. Bare feet are easily injured.  Check your feet daily for blisters, cuts, and redness. If you cannot see the bottom of your feet, use a mirror or ask someone for help.  Wash your feet with warm water (do not use hot water) and mild soap. Then pat your feet and the areas between your toes until they are completely dry. Do not soak your feet as this can dry your skin.  Apply a moisturizing lotion or petroleum jelly (that does not contain alcohol and is unscented) to the skin on your feet and to dry, brittle toenails. Do not apply lotion between your toes.  Trim your toenails straight across. Do not dig under them or around the cuticle. File the edges of your nails with an emery board or nail file.  Do not cut corns or calluses or try to remove them with medicine.  Wear clean socks or stockings every day. Make sure they are not too tight. Do not wear knee-high stockings since they may decrease blood flow to your legs.  Wear shoes that fit properly and have enough cushioning. To break in new shoes, wear them for just a few hours a day. This prevents you from injuring your feet. Always look in your shoes before you put them on to be sure there are no objects inside.  Do not cross your legs. This may decrease the blood flow to your feet.  If you find a minor scrape,  cut, or break in the skin on your feet, keep it and the skin around it clean and dry. These areas may be cleansed with mild soap and water. Do not cleanse the area with peroxide, alcohol, or iodine.  When you remove an adhesive bandage, be sure not to damage the skin around it.  If you have a wound, look at it several times a day to make sure it is healing.  Do not use heating pads or hot water bottles. They may burn your skin. If you have lost feeling in your feet or legs, you may not know it is happening until it is too late.  Make sure your health care provider performs a complete foot exam at least annually or more often if you have foot problems. Report any cuts, sores, or bruises to your health care provider immediately. SEEK MEDICAL CARE IF:   You have an injury that is not healing.  You have cuts or breaks in the skin.  You have an ingrown nail.  You notice redness on your legs or feet.  You feel burning or tingling in your legs or feet.  You have pain or cramps in your legs and feet.  Your legs or feet are numb.  Your feet always feel cold. SEEK IMMEDIATE MEDICAL CARE IF:   There is increasing redness,   swelling, or pain in or around a wound.  There is a red line that goes up your leg.  Pus is coming from a wound.  You develop a fever or as directed by your health care provider.  You notice a bad smell coming from an ulcer or wound. Document Released: 05/02/2000 Document Revised: 01/05/2013 Document Reviewed: 10/12/2012 ExitCare Patient Information 2015 [employer]. This information is not intended to replace advice given to you by your health care provider. Make sure you discuss any questions you have with your health care provider.  

## 2013-11-08 NOTE — Progress Notes (Signed)
She presents today with a chief complaint of painful elongated toenails.  Objective: Nails are thick yellow dystrophic onychomycotic painful palpation.  Assessment: Pain in limb secondary to onychomycosis 1 through 5 bilateral.  Plan: Debridement of nails in thickness and length as a covered service secondary to pain without iatrogenic lesions.

## 2013-11-16 ENCOUNTER — Encounter: Payer: Self-pay | Admitting: Internal Medicine

## 2013-11-21 ENCOUNTER — Ambulatory Visit (INDEPENDENT_AMBULATORY_CARE_PROVIDER_SITE_OTHER): Payer: Medicare Other | Admitting: *Deleted

## 2013-11-21 ENCOUNTER — Other Ambulatory Visit: Payer: Self-pay | Admitting: Cardiology

## 2013-11-21 DIAGNOSIS — Z5181 Encounter for therapeutic drug level monitoring: Secondary | ICD-10-CM

## 2013-11-21 DIAGNOSIS — I4891 Unspecified atrial fibrillation: Secondary | ICD-10-CM

## 2013-11-21 LAB — POCT INR: INR: 2.3

## 2013-12-27 DIAGNOSIS — R634 Abnormal weight loss: Secondary | ICD-10-CM | POA: Diagnosis not present

## 2013-12-27 DIAGNOSIS — E1149 Type 2 diabetes mellitus with other diabetic neurological complication: Secondary | ICD-10-CM | POA: Diagnosis not present

## 2013-12-27 DIAGNOSIS — F329 Major depressive disorder, single episode, unspecified: Secondary | ICD-10-CM | POA: Diagnosis not present

## 2013-12-27 DIAGNOSIS — N183 Chronic kidney disease, stage 3 unspecified: Secondary | ICD-10-CM | POA: Diagnosis not present

## 2013-12-27 DIAGNOSIS — F411 Generalized anxiety disorder: Secondary | ICD-10-CM | POA: Diagnosis not present

## 2013-12-27 DIAGNOSIS — I10 Essential (primary) hypertension: Secondary | ICD-10-CM | POA: Diagnosis not present

## 2013-12-27 DIAGNOSIS — G4700 Insomnia, unspecified: Secondary | ICD-10-CM | POA: Diagnosis not present

## 2013-12-27 DIAGNOSIS — E782 Mixed hyperlipidemia: Secondary | ICD-10-CM | POA: Diagnosis not present

## 2013-12-29 ENCOUNTER — Ambulatory Visit: Payer: Medicare Other | Admitting: Cardiology

## 2014-01-02 ENCOUNTER — Ambulatory Visit (INDEPENDENT_AMBULATORY_CARE_PROVIDER_SITE_OTHER): Payer: Medicare Other | Admitting: *Deleted

## 2014-01-02 DIAGNOSIS — I4891 Unspecified atrial fibrillation: Secondary | ICD-10-CM | POA: Diagnosis not present

## 2014-01-02 DIAGNOSIS — Z5181 Encounter for therapeutic drug level monitoring: Secondary | ICD-10-CM

## 2014-01-02 LAB — POCT INR: INR: 7.5

## 2014-01-02 LAB — PROTIME-INR
INR: 7.4 ratio (ref 0.8–1.0)
Prothrombin Time: 77.3 s (ref 9.6–13.1)

## 2014-01-06 ENCOUNTER — Ambulatory Visit (INDEPENDENT_AMBULATORY_CARE_PROVIDER_SITE_OTHER): Payer: Medicare Other | Admitting: *Deleted

## 2014-01-06 DIAGNOSIS — I4891 Unspecified atrial fibrillation: Secondary | ICD-10-CM

## 2014-01-06 DIAGNOSIS — Z5181 Encounter for therapeutic drug level monitoring: Secondary | ICD-10-CM | POA: Diagnosis not present

## 2014-01-06 LAB — POCT INR: INR: 1.6

## 2014-01-13 ENCOUNTER — Ambulatory Visit (INDEPENDENT_AMBULATORY_CARE_PROVIDER_SITE_OTHER): Payer: Medicare Other

## 2014-01-13 DIAGNOSIS — I4891 Unspecified atrial fibrillation: Secondary | ICD-10-CM | POA: Diagnosis not present

## 2014-01-13 DIAGNOSIS — Z5181 Encounter for therapeutic drug level monitoring: Secondary | ICD-10-CM

## 2014-01-13 LAB — POCT INR: INR: 3.8

## 2014-01-26 ENCOUNTER — Telehealth: Payer: Self-pay | Admitting: Cardiology

## 2014-01-26 ENCOUNTER — Ambulatory Visit (INDEPENDENT_AMBULATORY_CARE_PROVIDER_SITE_OTHER): Payer: Medicare Other | Admitting: *Deleted

## 2014-01-26 DIAGNOSIS — I4891 Unspecified atrial fibrillation: Secondary | ICD-10-CM | POA: Diagnosis not present

## 2014-01-26 DIAGNOSIS — I442 Atrioventricular block, complete: Secondary | ICD-10-CM | POA: Diagnosis not present

## 2014-01-26 NOTE — Telephone Encounter (Signed)
Spoke with pt and reminded pt of remote transmission that is due today. Pt verbalized understanding.   

## 2014-01-26 NOTE — Progress Notes (Signed)
Remote pacemaker transmission.   

## 2014-01-27 ENCOUNTER — Ambulatory Visit (INDEPENDENT_AMBULATORY_CARE_PROVIDER_SITE_OTHER): Payer: Medicare Other

## 2014-01-27 DIAGNOSIS — N183 Chronic kidney disease, stage 3 unspecified: Secondary | ICD-10-CM | POA: Diagnosis not present

## 2014-01-27 DIAGNOSIS — G4700 Insomnia, unspecified: Secondary | ICD-10-CM | POA: Diagnosis not present

## 2014-01-27 DIAGNOSIS — Z5181 Encounter for therapeutic drug level monitoring: Secondary | ICD-10-CM | POA: Diagnosis not present

## 2014-01-27 DIAGNOSIS — I4891 Unspecified atrial fibrillation: Secondary | ICD-10-CM

## 2014-01-27 DIAGNOSIS — E1149 Type 2 diabetes mellitus with other diabetic neurological complication: Secondary | ICD-10-CM | POA: Diagnosis not present

## 2014-01-27 DIAGNOSIS — I10 Essential (primary) hypertension: Secondary | ICD-10-CM | POA: Diagnosis not present

## 2014-01-27 DIAGNOSIS — R634 Abnormal weight loss: Secondary | ICD-10-CM | POA: Diagnosis not present

## 2014-01-27 DIAGNOSIS — F411 Generalized anxiety disorder: Secondary | ICD-10-CM | POA: Diagnosis not present

## 2014-01-27 DIAGNOSIS — F329 Major depressive disorder, single episode, unspecified: Secondary | ICD-10-CM | POA: Diagnosis not present

## 2014-01-27 DIAGNOSIS — E782 Mixed hyperlipidemia: Secondary | ICD-10-CM | POA: Diagnosis not present

## 2014-01-27 DIAGNOSIS — Z23 Encounter for immunization: Secondary | ICD-10-CM | POA: Diagnosis not present

## 2014-01-27 LAB — MDC_IDC_ENUM_SESS_TYPE_REMOTE
Battery Impedance: 100 Ohm
Battery Remaining Longevity: 146 mo
Battery Voltage: 2.79 V
Brady Statistic RV Percent Paced: 100 %
Date Time Interrogation Session: 20150910141052
Lead Channel Impedance Value: 476 Ohm
Lead Channel Impedance Value: 67 Ohm
Lead Channel Pacing Threshold Amplitude: 0.5 V
Lead Channel Pacing Threshold Pulse Width: 0.4 ms
Lead Channel Setting Pacing Amplitude: 2.5 V
Lead Channel Setting Pacing Pulse Width: 0.4 ms
Lead Channel Setting Sensing Sensitivity: 2.8 mV

## 2014-01-27 LAB — POCT INR: INR: 4.2

## 2014-02-10 ENCOUNTER — Ambulatory Visit (INDEPENDENT_AMBULATORY_CARE_PROVIDER_SITE_OTHER): Payer: Medicare Other | Admitting: Cardiology

## 2014-02-10 ENCOUNTER — Encounter: Payer: Self-pay | Admitting: Cardiology

## 2014-02-10 ENCOUNTER — Ambulatory Visit (INDEPENDENT_AMBULATORY_CARE_PROVIDER_SITE_OTHER): Payer: Medicare Other | Admitting: Pharmacist

## 2014-02-10 VITALS — BP 118/76 | HR 88 | Ht 63.0 in | Wt 98.0 lb

## 2014-02-10 DIAGNOSIS — Z950 Presence of cardiac pacemaker: Secondary | ICD-10-CM | POA: Diagnosis not present

## 2014-02-10 DIAGNOSIS — E7800 Pure hypercholesterolemia, unspecified: Secondary | ICD-10-CM | POA: Insufficient documentation

## 2014-02-10 DIAGNOSIS — Z7901 Long term (current) use of anticoagulants: Secondary | ICD-10-CM

## 2014-02-10 DIAGNOSIS — I4891 Unspecified atrial fibrillation: Secondary | ICD-10-CM

## 2014-02-10 DIAGNOSIS — I4819 Other persistent atrial fibrillation: Secondary | ICD-10-CM

## 2014-02-10 DIAGNOSIS — Z5181 Encounter for therapeutic drug level monitoring: Secondary | ICD-10-CM

## 2014-02-10 LAB — POCT INR: INR: 3.5

## 2014-02-10 NOTE — Progress Notes (Signed)
Patient ID: [name], female   DOB: 09-17-1928, 80 y.o.   MRN: [id number]      [address], [place]  [address] Phone: [phone] Fax:  [phone]  Date:  02/10/2014   ID:  [name], DOB [birth year], MRN [id number]  PCP:  Gara Kroner, MD   History of Present Illness: [name] is a 80 y.o. female with history of permanent atrial fibrillation, status post AV nodal ablation in 2010 for persistent symptoms. Diabetes, EF 45% likely from chronic pacing. Septal wall hypokinesis noted a nuclear stress test which actually showed an EF of 64%. Her pacemaker has been functioning normally.   Had replacement of PPM generator in March 2015. 3 months later had concerns of fatigue and unintentional weight loss and thus presented to EP office. Work up initiated, reportedly negative. However is now doing well. Pt attributes largely to depression during that time period.  Today reporting chronic cough from tobacco use and post nasal drip (did not take flonase today). Additionally has periods of several minutes of left sided chest discomfort under left breast tissue, with no radiation, no diaphoresis no n/v or accompanying sx. No exacerbating factors other than moving and sitting back down. Pain not reproducible on palpation of chest wall. Pain alleviated without intervention.   Wt Readings from Last 3 Encounters:  02/10/14 98 lb (44.453 kg)  10/25/13 92 lb (41.731 kg)  07/26/13 100 lb (45.36 kg)     Past Medical History  Diagnosis Date  . Atrial fibrillation 02/24/2013  . TIA (transient ischemic attack)   . Diabetes mellitus without complication   . Hypertension   . Depression   . Osteopenia     Past Surgical History  Procedure Laterality Date  . Cyst removal trunk      from breast  . Cateracts      eyes  . Pacemaker insertion  2008; 07/18/2013    MDT dual chamber pacemaker implanted 2008 by Dr Leonia Reeves; gen change 2015 by Dr Caryl Comes  . Ablation  2012   AVN ablation by Dr Lovena Le    Current Outpatient Prescriptions  Medication Sig Dispense Refill  . atorvastatin (LIPITOR) 10 MG tablet Take 5 mg by mouth daily. 1/2 tablet daily      . Calcium Carbonate (CALCIUM 600 PO) Take 1 tablet by mouth 2 (two) times daily.      . Cholecalciferol (VITAMIN D3) 2000 UNITS TABS Take 1 tablet by mouth daily.      Marland Kitchen COUMADIN 1 MG tablet Take as directed by anticoagulation clinic  75 tablet  3  . escitalopram (LEXAPRO) 10 MG tablet Take 10 mg by mouth daily.      . eszopiclone (LUNESTA) 2 MG TABS tablet Take 2 mg by mouth at bedtime as needed for sleep. Take immediately before bedtime      . fluticasone (FLONASE) 50 MCG/ACT nasal spray Place into both nostrils daily.      Marland Kitchen LORazepam (ATIVAN) 1 MG tablet Take 1 mg by mouth at bedtime as needed for anxiety or sleep.       . Multiple Vitamin (MULTIVITAMIN) tablet Take 1 tablet by mouth daily.      . pregabalin (LYRICA) 25 MG capsule Take one capsule by mouth daily  30 capsule  11  . telmisartan (MICARDIS) 80 MG tablet Take 80 mg by mouth daily.       No current facility-administered medications for this visit.    Allergies:  Allergies  Allergen Reactions  . Gabapentin     SHAKES AND NAUSEA   . Lyrica [Pregabalin]     DIZZY  . Rozerem [Ramelteon]     DIZZY AND HEAD THROBBING  . Temazepam     INSOMNIA  . Tikosyn [Dofetilide]     Mouth swells, itching    Social History:  The patient  reports that she has been smoking.  She does not have any smokeless tobacco history on file. She reports that she does not drink alcohol or use illicit drugs.   ROS:  Please see the history of present illness.   Denies any bleeding, syncope, new strokelike symptoms. No dizziness, no chest pain. She does have left foot pain, 2 weeks, dropped something on her foot. She is going to see orthopedist about this.    PHYSICAL EXAM: VS:  BP 118/76  Pulse 88  Ht 5\' 3"  (1.6 m)  Wt 98 lb (44.453 kg)  BMI 17.36 kg/m2 Thin, in  no acute distress HEENT: normal Neck: no JVD Cardiac:  Distant heart sounds but normal S1, S2; RRR; no murmur Lungs:  clear to auscultation bilaterally, no wheezing, rhonchi or rales, cough present  Abd: soft, nontender, no hepatomegaly Ext: no edema Skin: warm and dry Neuro: no focal abnormalities noted    ASSESSMENT AND PLAN:  1. Atrial fibrillation s/p nodal ablation- CHADS-VASc 5. Chronic Coumadin. Supratherapeutic often. NSR today. No falls/bleeding events  2. Pacemaker-s/p generator change- followed by Dr. Caryl Comes 3. Tobacco use-encouraged cessation again. She does not wish to quit. But is down to 1/2 ppd. Has had periods where she is quit altogether but stress is limiting factor 4. AV block/AV nodal ablation-permanent pacemaker required. Pacemaker dependent (as above) 5. Chest discomfort- no reported events on pacemaker. Improved with rubbing the chest wall. Suspect likely costochondritis and anxiety. Will cont to follow serially  6. DM- managed by Dr. Moreen Fowler. Titrate meds to A1C  Signed, Candee Furbish, MD [facility]  02/10/2014 3:42 PM

## 2014-02-10 NOTE — Patient Instructions (Signed)
The current medical regimen is effective;  continue present plan and medications.  Follow up in 6 months with Dr. Skains.  You will receive a letter in the mail 2 months before you are due.  Please call us when you receive this letter to schedule your follow up appointment.  

## 2014-02-20 ENCOUNTER — Encounter: Payer: Self-pay | Admitting: Cardiology

## 2014-02-24 ENCOUNTER — Ambulatory Visit (INDEPENDENT_AMBULATORY_CARE_PROVIDER_SITE_OTHER): Payer: Medicare Other | Admitting: Pharmacist

## 2014-02-24 DIAGNOSIS — I481 Persistent atrial fibrillation: Secondary | ICD-10-CM | POA: Diagnosis not present

## 2014-02-24 DIAGNOSIS — I4819 Other persistent atrial fibrillation: Secondary | ICD-10-CM

## 2014-02-24 DIAGNOSIS — I4891 Unspecified atrial fibrillation: Secondary | ICD-10-CM

## 2014-02-24 DIAGNOSIS — Z5181 Encounter for therapeutic drug level monitoring: Secondary | ICD-10-CM | POA: Diagnosis not present

## 2014-02-24 LAB — POCT INR: INR: 3.4

## 2014-02-28 ENCOUNTER — Encounter: Payer: Self-pay | Admitting: Internal Medicine

## 2014-03-13 ENCOUNTER — Ambulatory Visit (INDEPENDENT_AMBULATORY_CARE_PROVIDER_SITE_OTHER): Payer: Medicare Other

## 2014-03-13 DIAGNOSIS — Z5181 Encounter for therapeutic drug level monitoring: Secondary | ICD-10-CM | POA: Diagnosis not present

## 2014-03-13 DIAGNOSIS — I4891 Unspecified atrial fibrillation: Secondary | ICD-10-CM | POA: Diagnosis not present

## 2014-03-13 LAB — POCT INR: INR: 2.9

## 2014-03-14 ENCOUNTER — Encounter: Payer: Self-pay | Admitting: Podiatry

## 2014-03-14 ENCOUNTER — Ambulatory Visit (INDEPENDENT_AMBULATORY_CARE_PROVIDER_SITE_OTHER): Payer: Medicare Other | Admitting: Podiatry

## 2014-03-14 DIAGNOSIS — B351 Tinea unguium: Secondary | ICD-10-CM

## 2014-03-14 DIAGNOSIS — M79606 Pain in leg, unspecified: Secondary | ICD-10-CM | POA: Diagnosis not present

## 2014-03-14 NOTE — Patient Instructions (Signed)
Diabetes and Foot Care Diabetes may cause you to have problems because of poor blood supply (circulation) to your feet and legs. This may cause the skin on your feet to become thinner, break easier, and heal more slowly. Your skin may become dry, and the skin may peel and crack. You may also have nerve damage in your legs and feet causing decreased feeling in them. You may not notice minor injuries to your feet that could lead to infections or more serious problems. Taking care of your feet is one of the most important things you can do for yourself.  HOME CARE INSTRUCTIONS  Wear shoes at all times, even in the house. Do not go barefoot. Bare feet are easily injured.  Check your feet daily for blisters, cuts, and redness. If you cannot see the bottom of your feet, use a mirror or ask someone for help.  Wash your feet with warm water (do not use hot water) and mild soap. Then pat your feet and the areas between your toes until they are completely dry. Do not soak your feet as this can dry your skin.  Apply a moisturizing lotion or petroleum jelly (that does not contain alcohol and is unscented) to the skin on your feet and to dry, brittle toenails. Do not apply lotion between your toes.  Trim your toenails straight across. Do not dig under them or around the cuticle. File the edges of your nails with an emery board or nail file.  Do not cut corns or calluses or try to remove them with medicine.  Wear clean socks or stockings every day. Make sure they are not too tight. Do not wear knee-high stockings since they may decrease blood flow to your legs.  Wear shoes that fit properly and have enough cushioning. To break in new shoes, wear them for just a few hours a day. This prevents you from injuring your feet. Always look in your shoes before you put them on to be sure there are no objects inside.  Do not cross your legs. This may decrease the blood flow to your feet.  If you find a minor scrape,  cut, or break in the skin on your feet, keep it and the skin around it clean and dry. These areas may be cleansed with mild soap and water. Do not cleanse the area with peroxide, alcohol, or iodine.  When you remove an adhesive bandage, be sure not to damage the skin around it.  If you have a wound, look at it several times a day to make sure it is healing.  Do not use heating pads or hot water bottles. They may burn your skin. If you have lost feeling in your feet or legs, you may not know it is happening until it is too late.  Make sure your health care provider performs a complete foot exam at least annually or more often if you have foot problems. Report any cuts, sores, or bruises to your health care provider immediately. SEEK MEDICAL CARE IF:   You have an injury that is not healing.  You have cuts or breaks in the skin.  You have an ingrown nail.  You notice redness on your legs or feet.  You feel burning or tingling in your legs or feet.  You have pain or cramps in your legs and feet.  Your legs or feet are numb.  Your feet always feel cold. SEEK IMMEDIATE MEDICAL CARE IF:   There is increasing redness,   swelling, or pain in or around a wound.  There is a red line that goes up your leg.  Pus is coming from a wound.  You develop a fever or as directed by your health care provider.  You notice a bad smell coming from an ulcer or wound. Document Released: 05/02/2000 Document Revised: 01/05/2013 Document Reviewed: 10/12/2012 ExitCare Patient Information 2015 [employer]. This information is not intended to replace advice given to you by your health care provider. Make sure you discuss any questions you have with your health care provider.  

## 2014-03-14 NOTE — Progress Notes (Signed)
She presents today for follow-up of her painfully elongated toenails 1 through 5 bilateral.  Objective: Vital signs stable alert and oriented 3. Pulses are palpable bilateral. Nails are thick yellow dystrophic onychomycotic and painful palpation.  Assessment: Pain in limb secondary to onychomycosis 1 through 5 bilateral.  Plan: Debridement of nails 1 through 5 bilateral service secondary to pain.

## 2014-03-31 ENCOUNTER — Ambulatory Visit (INDEPENDENT_AMBULATORY_CARE_PROVIDER_SITE_OTHER): Payer: Medicare Other | Admitting: *Deleted

## 2014-03-31 ENCOUNTER — Other Ambulatory Visit: Payer: Self-pay | Admitting: *Deleted

## 2014-03-31 DIAGNOSIS — Z5181 Encounter for therapeutic drug level monitoring: Secondary | ICD-10-CM | POA: Diagnosis not present

## 2014-03-31 DIAGNOSIS — I4891 Unspecified atrial fibrillation: Secondary | ICD-10-CM

## 2014-03-31 LAB — POCT INR: INR: 3.7

## 2014-03-31 MED ORDER — COUMADIN 1 MG PO TABS: ORAL_TABLET | ORAL | Status: DC

## 2014-04-17 ENCOUNTER — Ambulatory Visit (INDEPENDENT_AMBULATORY_CARE_PROVIDER_SITE_OTHER): Payer: Medicare Other | Admitting: Pharmacist

## 2014-04-17 DIAGNOSIS — I4891 Unspecified atrial fibrillation: Secondary | ICD-10-CM

## 2014-04-17 DIAGNOSIS — Z5181 Encounter for therapeutic drug level monitoring: Secondary | ICD-10-CM

## 2014-04-17 LAB — POCT INR: INR: 2.8

## 2014-04-27 ENCOUNTER — Encounter (HOSPITAL_COMMUNITY): Payer: Self-pay | Admitting: Internal Medicine

## 2014-04-28 ENCOUNTER — Other Ambulatory Visit: Payer: Self-pay | Admitting: Family Medicine

## 2014-04-28 ENCOUNTER — Ambulatory Visit
Admission: RE | Admit: 2014-04-28 | Discharge: 2014-04-28 | Disposition: A | Discharge status: Home / Self Care | Payer: Medicare Other | Source: Ambulatory Visit | Attending: Family Medicine | Admitting: Family Medicine

## 2014-04-28 DIAGNOSIS — M8588 Other specified disorders of bone density and structure, other site: Secondary | ICD-10-CM | POA: Diagnosis not present

## 2014-04-28 DIAGNOSIS — E1149 Type 2 diabetes mellitus with other diabetic neurological complication: Secondary | ICD-10-CM | POA: Diagnosis not present

## 2014-04-28 DIAGNOSIS — N183 Chronic kidney disease, stage 3 (moderate): Secondary | ICD-10-CM | POA: Diagnosis not present

## 2014-04-28 DIAGNOSIS — M545 Low back pain: Secondary | ICD-10-CM

## 2014-04-28 DIAGNOSIS — E782 Mixed hyperlipidemia: Secondary | ICD-10-CM | POA: Diagnosis not present

## 2014-04-28 DIAGNOSIS — F411 Generalized anxiety disorder: Secondary | ICD-10-CM | POA: Diagnosis not present

## 2014-04-28 DIAGNOSIS — F329 Major depressive disorder, single episode, unspecified: Secondary | ICD-10-CM | POA: Diagnosis not present

## 2014-04-28 DIAGNOSIS — M5136 Other intervertebral disc degeneration, lumbar region: Secondary | ICD-10-CM | POA: Diagnosis not present

## 2014-04-28 DIAGNOSIS — I129 Hypertensive chronic kidney disease with stage 1 through stage 4 chronic kidney disease, or unspecified chronic kidney disease: Secondary | ICD-10-CM | POA: Diagnosis not present

## 2014-04-28 DIAGNOSIS — M4856XA Collapsed vertebra, not elsewhere classified, lumbar region, initial encounter for fracture: Secondary | ICD-10-CM | POA: Diagnosis not present

## 2014-04-28 DIAGNOSIS — I4891 Unspecified atrial fibrillation: Secondary | ICD-10-CM | POA: Diagnosis not present

## 2014-04-28 DIAGNOSIS — G4700 Insomnia, unspecified: Secondary | ICD-10-CM | POA: Diagnosis not present

## 2014-05-01 ENCOUNTER — Telehealth: Payer: Self-pay | Admitting: Cardiology

## 2014-05-01 ENCOUNTER — Ambulatory Visit (INDEPENDENT_AMBULATORY_CARE_PROVIDER_SITE_OTHER): Payer: Medicare Other | Admitting: *Deleted

## 2014-05-01 DIAGNOSIS — I442 Atrioventricular block, complete: Secondary | ICD-10-CM | POA: Diagnosis not present

## 2014-05-01 NOTE — Telephone Encounter (Signed)
Spoke with pt and reminded pt of remote transmission that is due today. Pt verbalized understanding.   

## 2014-05-01 NOTE — Progress Notes (Signed)
Remote pacemaker transmission.   

## 2014-05-02 LAB — MDC_IDC_ENUM_SESS_TYPE_REMOTE
Battery Impedance: 100 Ohm
Battery Remaining Longevity: 147 mo
Battery Voltage: 2.79 V
Brady Statistic RV Percent Paced: 100 %
Date Time Interrogation Session: 20151214181640
Lead Channel Impedance Value: 496 Ohm
Lead Channel Impedance Value: 67 Ohm
Lead Channel Pacing Threshold Amplitude: 0.625 V
Lead Channel Pacing Threshold Pulse Width: 0.4 ms
Lead Channel Setting Pacing Amplitude: 2.5 V
Lead Channel Setting Pacing Pulse Width: 0.4 ms
Lead Channel Setting Sensing Sensitivity: 2.8 mV

## 2014-05-08 ENCOUNTER — Ambulatory Visit (INDEPENDENT_AMBULATORY_CARE_PROVIDER_SITE_OTHER): Payer: Medicare Other | Admitting: Pharmacist

## 2014-05-08 DIAGNOSIS — Z5181 Encounter for therapeutic drug level monitoring: Secondary | ICD-10-CM | POA: Diagnosis not present

## 2014-05-08 DIAGNOSIS — I4891 Unspecified atrial fibrillation: Secondary | ICD-10-CM

## 2014-05-08 LAB — POCT INR: INR: 2.7

## 2014-05-09 ENCOUNTER — Telehealth: Payer: Self-pay | Admitting: *Deleted

## 2014-05-09 DIAGNOSIS — M79606 Pain in leg, unspecified: Secondary | ICD-10-CM

## 2014-05-09 DIAGNOSIS — E1142 Type 2 diabetes mellitus with diabetic polyneuropathy: Secondary | ICD-10-CM

## 2014-05-09 MED ORDER — PREGABALIN 50 MG PO CAPS: ORAL_CAPSULE | ORAL | Status: DC

## 2014-05-09 MED ORDER — PREGABALIN 25 MG PO CAPS: ORAL_CAPSULE | ORAL | Status: DC

## 2014-05-09 NOTE — Telephone Encounter (Signed)
Pt request to increase Lyrica to 50 mg, Dr. Milinda Pointer ordered increase.

## 2014-05-25 ENCOUNTER — Encounter: Payer: Self-pay | Admitting: Cardiology

## 2014-05-31 ENCOUNTER — Encounter: Payer: Self-pay | Admitting: Internal Medicine

## 2014-06-05 ENCOUNTER — Ambulatory Visit (INDEPENDENT_AMBULATORY_CARE_PROVIDER_SITE_OTHER): Payer: Medicare Other

## 2014-06-05 DIAGNOSIS — Z5181 Encounter for therapeutic drug level monitoring: Secondary | ICD-10-CM

## 2014-06-05 DIAGNOSIS — I4891 Unspecified atrial fibrillation: Secondary | ICD-10-CM

## 2014-06-05 LAB — POCT INR: INR: 3.6

## 2014-06-05 MED ORDER — WARFARIN SODIUM 1 MG PO TABS: ORAL_TABLET | ORAL | Status: DC

## 2014-06-08 ENCOUNTER — Other Ambulatory Visit: Payer: Self-pay | Admitting: *Deleted

## 2014-06-08 NOTE — Telephone Encounter (Signed)
Spoke with patient in reference to her warfarin. A prescription was sent over for warfarin and she received coumadin tablets from pharmacy. She states she can not afford the expense of the coumadin due to insurance changes. I called the pharmacy and they stated they assumed she wanted brand name instead of generic due to in the past she would only take brand. I called patient back and explained and she states the pharmacy called her,too and instructed her to bring the med for refund and warfarin tablets. Patient is going to the pharmacy to return the tablets and pick up the warfarin tablets as ordered.

## 2014-06-13 ENCOUNTER — Ambulatory Visit: Payer: Medicare Other | Admitting: Podiatry

## 2014-06-19 ENCOUNTER — Ambulatory Visit (INDEPENDENT_AMBULATORY_CARE_PROVIDER_SITE_OTHER): Payer: Medicare Other

## 2014-06-19 DIAGNOSIS — I4891 Unspecified atrial fibrillation: Secondary | ICD-10-CM

## 2014-06-19 DIAGNOSIS — Z5181 Encounter for therapeutic drug level monitoring: Secondary | ICD-10-CM

## 2014-06-19 LAB — POCT INR: INR: 2.7

## 2014-07-06 ENCOUNTER — Encounter: Payer: Self-pay | Admitting: Internal Medicine

## 2014-07-10 ENCOUNTER — Ambulatory Visit (INDEPENDENT_AMBULATORY_CARE_PROVIDER_SITE_OTHER): Payer: Medicare Other | Admitting: *Deleted

## 2014-07-10 DIAGNOSIS — Z5181 Encounter for therapeutic drug level monitoring: Secondary | ICD-10-CM

## 2014-07-10 DIAGNOSIS — I4891 Unspecified atrial fibrillation: Secondary | ICD-10-CM

## 2014-07-10 LAB — POCT INR: INR: 2.4

## 2014-07-17 ENCOUNTER — Encounter: Payer: Medicare Other | Admitting: Internal Medicine

## 2014-07-20 ENCOUNTER — Ambulatory Visit (INDEPENDENT_AMBULATORY_CARE_PROVIDER_SITE_OTHER): Payer: Medicare Other | Admitting: Podiatry

## 2014-07-20 ENCOUNTER — Encounter: Payer: Self-pay | Admitting: Podiatry

## 2014-07-20 DIAGNOSIS — B351 Tinea unguium: Secondary | ICD-10-CM | POA: Diagnosis not present

## 2014-07-20 DIAGNOSIS — M79673 Pain in unspecified foot: Secondary | ICD-10-CM | POA: Diagnosis not present

## 2014-07-20 NOTE — Progress Notes (Signed)
She presents today for follow-up of her painfully elongated toenails 1 through 5 bilateral.  Objective: Vital signs stable alert and oriented 3. Pulses are palpable bilateral. Nails are thick yellow dystrophic onychomycotic and painful palpation.  Assessment: Pain in limb secondary to onychomycosis 1 through 5 bilateral.  Plan: Debridement of nails 1 through 5 bilateral service secondary to pain.

## 2014-08-01 ENCOUNTER — Ambulatory Visit (INDEPENDENT_AMBULATORY_CARE_PROVIDER_SITE_OTHER): Payer: Medicare Other | Admitting: Internal Medicine

## 2014-08-01 ENCOUNTER — Encounter: Payer: Self-pay | Admitting: Internal Medicine

## 2014-08-01 VITALS — BP 120/74 | HR 82 | Ht 63.0 in | Wt 103.6 lb

## 2014-08-01 DIAGNOSIS — I482 Chronic atrial fibrillation, unspecified: Secondary | ICD-10-CM

## 2014-08-01 DIAGNOSIS — I495 Sick sinus syndrome: Secondary | ICD-10-CM | POA: Diagnosis not present

## 2014-08-01 DIAGNOSIS — Z45018 Encounter for adjustment and management of other part of cardiac pacemaker: Secondary | ICD-10-CM

## 2014-08-01 DIAGNOSIS — I442 Atrioventricular block, complete: Secondary | ICD-10-CM | POA: Diagnosis not present

## 2014-08-01 LAB — MDC_IDC_ENUM_SESS_TYPE_INCLINIC
Battery Impedance: 100 Ohm
Battery Remaining Longevity: 146 mo
Battery Voltage: 2.79 V
Brady Statistic RV Percent Paced: 100 %
Date Time Interrogation Session: 20160315140237
Lead Channel Impedance Value: 487 Ohm
Lead Channel Impedance Value: 67 Ohm
Lead Channel Pacing Threshold Amplitude: 0.5 V
Lead Channel Pacing Threshold Pulse Width: 0.4 ms
Lead Channel Setting Pacing Amplitude: 2.5 V
Lead Channel Setting Pacing Pulse Width: 0.4 ms
Lead Channel Setting Sensing Sensitivity: 2.8 mV

## 2014-08-01 NOTE — Progress Notes (Addendum)
Patient Care Team: Antony Contras, MD as PCP - General (Family Medicine)   HPI  [name] is a 81 y.o. female Seen for pacemaker followup This had been undertaken for tachybradycardia syndrome. 2012 she underwent AV junction ablation by Dr. Lovena Le.  Thromboembolic risk profile includes prior TIA-2, hypertension-1, diabetes-1, gender-1, age-63 for a CHADS-VASc score of 7  The patient denies chest pain, shortness of breath, nocturnal dyspnea, orthopnea or peripheral edema.  There have been no palpitations, lightheadedness or syncope.    Past Medical History  Diagnosis Date  . Atrial fibrillation 02/24/2013  . TIA (transient ischemic attack)   . Diabetes mellitus without complication   . Hypertension   . Depression   . Osteopenia     Past Surgical History  Procedure Laterality Date  . Cyst removal trunk      from breast  . Cateracts      eyes  . Pacemaker insertion  2008; 07/18/2013    MDT dual chamber pacemaker implanted 2008 by Dr Leonia Reeves; gen change 2015 by Dr Caryl Comes  . Ablation  2012    AVN ablation by Dr Lovena Le  . Permanent pacemaker generator change N/A 07/18/2013    Procedure: PERMANENT PACEMAKER GENERATOR CHANGE;  Surgeon: Deboraha Sprang, MD;  Location[facility] CATH LAB;  Service: Cardiovascular;  Laterality: N/A;    Current Outpatient Prescriptions  Medication Sig Dispense Refill  . atorvastatin (LIPITOR) 10 MG tablet Take 5 mg by mouth daily. 1/2 tablet daily    . Calcium Carbonate (CALCIUM 600 PO) Take 1 tablet by mouth 2 (two) times daily.    . Cholecalciferol (VITAMIN D3) 2000 UNITS TABS Take 1 tablet by mouth daily.    Marland Kitchen escitalopram (LEXAPRO) 10 MG tablet Take 10 mg by mouth daily.    Marland Kitchen linagliptin (TRADJENTA) 5 MG TABS tablet Take 5 mg by mouth daily.    Marland Kitchen LORazepam (ATIVAN) 1 MG tablet Take 1 mg by mouth at bedtime as needed for anxiety or sleep.     . Multiple Vitamin (MULTIVITAMIN) tablet Take 1 tablet by mouth daily.    . pregabalin (LYRICA) 50 MG  capsule Take one capsule by mouth daily 30 capsule 11  . telmisartan (MICARDIS) 80 MG tablet Take 80 mg by mouth daily.    Marland Kitchen apixaban (ELIQUIS) 2.5 MG TABS tablet Take 1 tablet (2.5 mg total) by mouth 2 (two) times daily. 60 tablet 0   No current facility-administered medications for this visit.    Allergies  Allergen Reactions  . Gabapentin     SHAKES AND NAUSEA   . Lyrica [Pregabalin]     DIZZY  . Rozerem [Ramelteon]     DIZZY AND HEAD THROBBING  . Temazepam     INSOMNIA  . Tikosyn [Dofetilide]     Mouth swells, itching    Review of Systems negative except from HPI and PMH  Physical Exam BP 120/74 mmHg  Pulse 82  Ht 5\' 3"  (1.6 m)  Wt 103 lb 9.6 oz (46.993 kg)  BMI 18.36 kg/m2 Well developed and cachectic  in no acute distress HENT normal E scleral and icterus clear Neck Supple JVP flat; carotids brisk and full Clear to ausculation  Regular rate and rhythm, no murmurs gallops or rub Soft with active bowel sounds No clubbing cyanosis none Edema Alert and oriented, grossly normal motor and sensory function Skin Warm and Dry    Assessment and  Plan  Atrial fibrillation  Complete heart block  Pacemaker Medtronic  The patient's device was interrogated.  The information was reviewed. No changes were made in the programming.    Stable rhythm issues

## 2014-08-01 NOTE — Patient Instructions (Addendum)
Your physician recommends that you continue on your current medications as directed. Please refer to the Current Medication list given to you today.  Your physician has requested that you have an echocardiogram. Echocardiography is a painless test that uses sound waves to create images of your heart. It provides your doctor with information about the size and shape of your heart and how well your heart's chambers and valves are working. This procedure takes approximately one hour. There are no restrictions for this procedure.  Remote monitoring is used to monitor your pacemaker from home. This monitoring reduces the number of office visits required to check your device to one time per year. It allows [place] to keep an eye on the functioning of your device to ensure it is working properly. You are scheduled for a device check from home on 11/02/2014. You may send your transmission at any time that day. If you have a wireless device, the transmission will be sent automatically. After your physician reviews your transmission, you will receive a postcard with your next transmission date.  Your physician recommends that you schedule a follow-up appointment in: 12 months with Dr.Klein

## 2014-08-07 ENCOUNTER — Ambulatory Visit (INDEPENDENT_AMBULATORY_CARE_PROVIDER_SITE_OTHER): Payer: Medicare Other

## 2014-08-07 DIAGNOSIS — I4891 Unspecified atrial fibrillation: Secondary | ICD-10-CM

## 2014-08-07 DIAGNOSIS — Z5181 Encounter for therapeutic drug level monitoring: Secondary | ICD-10-CM

## 2014-08-07 LAB — POCT INR: INR: 3

## 2014-08-08 ENCOUNTER — Ambulatory Visit (HOSPITAL_COMMUNITY): Payer: Medicare Other | Attending: Internal Medicine

## 2014-08-08 ENCOUNTER — Other Ambulatory Visit: Payer: Self-pay | Admitting: *Deleted

## 2014-08-08 DIAGNOSIS — E785 Hyperlipidemia, unspecified: Secondary | ICD-10-CM | POA: Insufficient documentation

## 2014-08-08 DIAGNOSIS — I4891 Unspecified atrial fibrillation: Secondary | ICD-10-CM | POA: Diagnosis not present

## 2014-08-08 DIAGNOSIS — I482 Chronic atrial fibrillation, unspecified: Secondary | ICD-10-CM

## 2014-08-08 MED ORDER — APIXABAN 2.5 MG PO TABS: 2.5000 mg | ORAL_TABLET | Freq: Two times a day (BID) | ORAL | Status: DC

## 2014-08-08 NOTE — Addendum Note (Signed)
Addended by: Deboraha Sprang on: 08/08/2014 05:56 PM   Modules accepted: Level of Service

## 2014-08-08 NOTE — Progress Notes (Signed)
2D Echo completed. 08/08/2014 

## 2014-08-09 ENCOUNTER — Encounter: Payer: Self-pay | Admitting: Internal Medicine

## 2014-08-16 ENCOUNTER — Ambulatory Visit (INDEPENDENT_AMBULATORY_CARE_PROVIDER_SITE_OTHER): Payer: Medicare Other | Admitting: Cardiology

## 2014-08-16 ENCOUNTER — Encounter: Payer: Self-pay | Admitting: Cardiology

## 2014-08-16 VITALS — BP 118/70 | HR 79 | Ht 63.0 in | Wt 104.0 lb

## 2014-08-16 DIAGNOSIS — I442 Atrioventricular block, complete: Secondary | ICD-10-CM

## 2014-08-16 DIAGNOSIS — I4819 Other persistent atrial fibrillation: Secondary | ICD-10-CM

## 2014-08-16 DIAGNOSIS — Z950 Presence of cardiac pacemaker: Secondary | ICD-10-CM

## 2014-08-16 DIAGNOSIS — I481 Persistent atrial fibrillation: Secondary | ICD-10-CM | POA: Diagnosis not present

## 2014-08-16 DIAGNOSIS — E7800 Pure hypercholesterolemia, unspecified: Secondary | ICD-10-CM

## 2014-08-16 DIAGNOSIS — Z7901 Long term (current) use of anticoagulants: Secondary | ICD-10-CM

## 2014-08-16 NOTE — Patient Instructions (Signed)
Your physician recommends that you continue on your current medications as directed. Please refer to the Current Medication list given to you today.  Your physician wants you to follow-up in: 6 months with Dr. Skains. You will receive a reminder letter in the mail two months in advance. If you don't receive a letter, please call our office to schedule the follow-up appointment.  

## 2014-08-16 NOTE — Progress Notes (Signed)
[facility] [address], [address] [place], [place]  [address] Phone: [phone] Fax:  [phone]  Date:  08/16/2014   ID:  [name], DOB [birth year], MRN [id number]  PCP:  Gara Kroner, MD   History of Present Illness: [name] is a 81 y.o. female with history of permanent atrial fibrillation, status post AV nodal ablation in 2010 for persistent symptoms. Diabetes, EF 45% likely from chronic pacing. Septal wall hypokinesis noted a nuclear stress test which actually showed an EF of 64%. Her pacemaker has been functioning normally. Underlying atrial fibrillation noted.  Had joint pain from Trajenta.  Still continues to smoke. Cough. No fevers noted. Overall she's doing well with her recent transition from Coumadin over to Eliquis. No fevers.   Wt Readings from Last 3 Encounters:  08/16/14 104 lb (47.174 kg)  08/01/14 103 lb 9.6 oz (46.993 kg)  02/10/14 98 lb (44.453 kg)     Past Medical History  Diagnosis Date  . Atrial fibrillation 02/24/2013  . TIA (transient ischemic attack)   . Diabetes mellitus without complication   . Hypertension   . Depression   . Osteopenia     Past Surgical History  Procedure Laterality Date  . Cyst removal trunk      from breast  . Cateracts      eyes  . Pacemaker insertion  2008; 07/18/2013    MDT dual chamber pacemaker implanted 2008 by Dr Leonia Reeves; gen change 2015 by Dr Caryl Comes  . Ablation  2012    AVN ablation by Dr Lovena Le  . Permanent pacemaker generator change N/A 07/18/2013    Procedure: PERMANENT PACEMAKER GENERATOR CHANGE;  Surgeon: Deboraha Sprang, MD;  Location: [facility] CATH LAB;  Service: Cardiovascular;  Laterality: N/A;    Current Outpatient Prescriptions  Medication Sig Dispense Refill  . apixaban (ELIQUIS) 2.5 MG TABS tablet Take 1 tablet (2.5 mg total) by mouth 2 (two) times daily. 60 tablet 0  . atorvastatin (LIPITOR) 10 MG tablet Take 5 mg by mouth daily. 1/2 tablet daily    . Calcium Carbonate (CALCIUM 600 PO) Take 1  tablet by mouth 2 (two) times daily.    . Cholecalciferol (VITAMIN D3) 2000 UNITS TABS Take 1 tablet by mouth daily.    Marland Kitchen escitalopram (LEXAPRO) 10 MG tablet Take 10 mg by mouth daily.    Marland Kitchen LORazepam (ATIVAN) 1 MG tablet Take 1 mg by mouth at bedtime as needed for anxiety or sleep.     . Multiple Vitamin (MULTIVITAMIN) tablet Take 1 tablet by mouth daily.    . pregabalin (LYRICA) 50 MG capsule Take one capsule by mouth daily 30 capsule 11  . telmisartan (MICARDIS) 80 MG tablet Take 80 mg by mouth daily.     No current facility-administered medications for this visit.    Allergies:    Allergies  Allergen Reactions  . Gabapentin     SHAKES AND NAUSEA   . Lyrica [Pregabalin]     DIZZY  . Rozerem [Ramelteon]     DIZZY AND HEAD THROBBING  . Temazepam     INSOMNIA  . Tikosyn [Dofetilide]     Mouth swells, itching  . Tradjenta [Linagliptin] Diarrhea    Joint Pain, Stomach issues    Social History:  The patient  reports that she has been smoking.  She does not have any smokeless tobacco history on file. She reports that she does not drink alcohol or use illicit drugs.   ROS:  Please see the history of present illness.   Denies any bleeding, syncope, new strokelike symptoms. No dizziness, no chest pain. Positive for cough, occasional depression, snoring, anxiety, leg pain, excessive fatigue.   PHYSICAL EXAM: VS:  BP 118/70 mmHg  Pulse 79  Ht 5\' 3"  (1.6 m)  Wt 104 lb (47.174 kg)  BMI 18.43 kg/m2 Thin, in no acute distress HEENT: normal Neck: no JVD Cardiac:  normal S1, S2; RRR; no murmur Lungs:  + wheezing.  Abd: soft, nontender, no hepatomegaly Ext: no edema Skin: warm and dry Neuro: no focal abnormalities noted  EKG:  Atrial fibrillation rate 71, ventricular pacing.    Echocardiogram: 08/08/14-normal ejection fraction, there was an oscillating density on pacemaker lead.  ASSESSMENT AND PLAN:  1. Atrial fibrillation- CHADS-VASc 5. Eliquis. Off of her aspirin. AV nodal  ablation. 2. Pacemaker-reviewed office note on 06/07/13 from Dr. Caryl Comes. Approaching ERI 3. Tobacco use-encourage cessation. She does not wish to quit. 4. COPD/bronchitis-likely from extensive smoking history. Cough heard today. Wheezing heard today. Continue to work with Dr. Moreen Fowler 5. AV block/AV nodal ablation-permanent pacemaker required. Pacemaker dependent. Dr. Caryl Comes watching.   Signed, Candee Furbish, MD [facility]  08/16/2014 1:42 PM

## 2014-09-01 ENCOUNTER — Telehealth: Payer: Self-pay | Admitting: Cardiology

## 2014-09-01 NOTE — Telephone Encounter (Signed)
Pt called stating that she could hardly move  body aching and trembling and she states thinks having reaction from the Eliquis.Pt states she thought had virus and went to MD and he said did not have virus. Spoke with Dr Hedy Camara and she said that was not normal reaction from Eliquis. Informed pt of this and she states she wants to return to Warfarin so instructed to overlap Eliquis 2.5mg  BID with Warfarin 2mg  on Friday, Saturday,and Sunday and then continue on Warfarin Pt states she has appt to be seen on Monday April 18th  and she states understanding of these instructions.

## 2014-09-01 NOTE — Telephone Encounter (Signed)
New problem   Pt need to speak to someone concerning her Eliquis. Please call pt.

## 2014-09-04 ENCOUNTER — Ambulatory Visit (INDEPENDENT_AMBULATORY_CARE_PROVIDER_SITE_OTHER): Payer: Medicare Other | Admitting: *Deleted

## 2014-09-04 DIAGNOSIS — I4819 Other persistent atrial fibrillation: Secondary | ICD-10-CM

## 2014-09-04 DIAGNOSIS — I481 Persistent atrial fibrillation: Secondary | ICD-10-CM | POA: Diagnosis not present

## 2014-09-04 DIAGNOSIS — Z5181 Encounter for therapeutic drug level monitoring: Secondary | ICD-10-CM | POA: Diagnosis not present

## 2014-09-04 DIAGNOSIS — I4891 Unspecified atrial fibrillation: Secondary | ICD-10-CM

## 2014-09-04 LAB — POCT INR: INR: 1.2

## 2014-09-04 NOTE — Patient Instructions (Signed)

## 2014-09-11 ENCOUNTER — Ambulatory Visit (INDEPENDENT_AMBULATORY_CARE_PROVIDER_SITE_OTHER): Payer: Medicare Other | Admitting: *Deleted

## 2014-09-11 DIAGNOSIS — Z5181 Encounter for therapeutic drug level monitoring: Secondary | ICD-10-CM | POA: Diagnosis not present

## 2014-09-11 DIAGNOSIS — I4891 Unspecified atrial fibrillation: Secondary | ICD-10-CM | POA: Diagnosis not present

## 2014-09-11 DIAGNOSIS — I481 Persistent atrial fibrillation: Secondary | ICD-10-CM

## 2014-09-11 DIAGNOSIS — I4819 Other persistent atrial fibrillation: Secondary | ICD-10-CM

## 2014-09-11 LAB — POCT INR: INR: 3

## 2014-09-17 ENCOUNTER — Encounter (HOSPITAL_COMMUNITY): Payer: Self-pay

## 2014-09-17 ENCOUNTER — Emergency Department (HOSPITAL_COMMUNITY)
Admission: EM | Admit: 2014-09-17 | Discharge: 2014-09-17 | Disposition: A | Discharge status: Home / Self Care | Payer: Medicare Other | Attending: Emergency Medicine | Admitting: Emergency Medicine

## 2014-09-17 ENCOUNTER — Emergency Department (HOSPITAL_COMMUNITY): Payer: Medicare Other

## 2014-09-17 DIAGNOSIS — M8580 Other specified disorders of bone density and structure, unspecified site: Secondary | ICD-10-CM | POA: Diagnosis not present

## 2014-09-17 DIAGNOSIS — Z79899 Other long term (current) drug therapy: Secondary | ICD-10-CM | POA: Insufficient documentation

## 2014-09-17 DIAGNOSIS — F329 Major depressive disorder, single episode, unspecified: Secondary | ICD-10-CM | POA: Diagnosis not present

## 2014-09-17 DIAGNOSIS — R0789 Other chest pain: Secondary | ICD-10-CM

## 2014-09-17 DIAGNOSIS — Z8673 Personal history of transient ischemic attack (TIA), and cerebral infarction without residual deficits: Secondary | ICD-10-CM | POA: Diagnosis not present

## 2014-09-17 DIAGNOSIS — R079 Chest pain, unspecified: Secondary | ICD-10-CM | POA: Diagnosis not present

## 2014-09-17 DIAGNOSIS — Z720 Tobacco use: Secondary | ICD-10-CM | POA: Insufficient documentation

## 2014-09-17 DIAGNOSIS — R739 Hyperglycemia, unspecified: Secondary | ICD-10-CM

## 2014-09-17 DIAGNOSIS — I10 Essential (primary) hypertension: Secondary | ICD-10-CM | POA: Insufficient documentation

## 2014-09-17 DIAGNOSIS — E1165 Type 2 diabetes mellitus with hyperglycemia: Secondary | ICD-10-CM | POA: Diagnosis not present

## 2014-09-17 LAB — BASIC METABOLIC PANEL
Anion gap: 11 (ref 5–15)
BUN: 26 mg/dL — ABNORMAL HIGH (ref 6–20)
CO2: 26 mmol/L (ref 22–32)
Calcium: 9.5 mg/dL (ref 8.9–10.3)
Chloride: 98 mmol/L — ABNORMAL LOW (ref 101–111)
Creatinine, Ser: 1.24 mg/dL — ABNORMAL HIGH (ref 0.44–1.00)
GFR calc Af Amer: 45 mL/min — ABNORMAL LOW (ref >60)
GFR calc non Af Amer: 39 mL/min — ABNORMAL LOW (ref >60)
Glucose, Bld: 249 mg/dL — ABNORMAL HIGH (ref 70–99)
Potassium: 4.8 mmol/L (ref 3.5–5.1)
Sodium: 135 mmol/L (ref 135–145)

## 2014-09-17 LAB — I-STAT TROPONIN, ED
Troponin i, poc: 0 ng/mL (ref 0.00–0.08)
Troponin i, poc: 0 ng/mL (ref 0.00–0.08)

## 2014-09-17 LAB — CBC
HCT: 43.9 % (ref 36.0–46.0)
Hemoglobin: 15 g/dL (ref 12.0–15.0)
MCH: 31.4 pg (ref 26.0–34.0)
MCHC: 34.2 g/dL (ref 30.0–36.0)
MCV: 91.8 fL (ref 78.0–100.0)
Platelets: 143 10*3/uL — ABNORMAL LOW (ref 150–400)
RBC: 4.78 MIL/uL (ref 3.87–5.11)
RDW: 13.2 % (ref 11.5–15.5)
WBC: 7.4 10*3/uL (ref 4.0–10.5)

## 2014-09-17 LAB — I-STAT CHEM 8, ED
BUN: 34 mg/dL — ABNORMAL HIGH (ref 6–20)
Calcium, Ion: 1.21 mmol/L (ref 1.13–1.30)
Chloride: 98 mmol/L — ABNORMAL LOW (ref 101–111)
Creatinine, Ser: 1.1 mg/dL — ABNORMAL HIGH (ref 0.44–1.00)
Glucose, Bld: 161 mg/dL — ABNORMAL HIGH (ref 70–99)
HCT: 48 % — ABNORMAL HIGH (ref 36.0–46.0)
Hemoglobin: 16.3 g/dL — ABNORMAL HIGH (ref 12.0–15.0)
Potassium: 4.9 mmol/L (ref 3.5–5.1)
Sodium: 137 mmol/L (ref 135–145)
TCO2: 26 mmol/L (ref 0–100)

## 2014-09-17 LAB — PROTIME-INR
INR: 2.46 — ABNORMAL HIGH (ref 0.00–1.49)
Prothrombin Time: 26.9 seconds — ABNORMAL HIGH (ref 11.6–15.2)

## 2014-09-17 MED ORDER — HYDROCHLOROTHIAZIDE 12.5 MG PO CAPS: 12.5000 mg | ORAL_CAPSULE | Freq: Every day | ORAL | Status: DC

## 2014-09-17 NOTE — ED Notes (Signed)
Pt reports 6pm pt was eating supper and felt pain in left chest and felt like she was going to pass out, lasted for several minutes.  Pt seen at [facility] walk in clinic yesterday for weakness x 1 week.  Pt still eports weakness.  No chest pain or shortness of breath at this time.

## 2014-09-17 NOTE — ED Provider Notes (Signed)
CSN: [id number]     Arrival date & time 09/17/14  1849 History   First MD Initiated Contact with Patient 09/17/14 2003     Chief Complaint  Patient presents with  . Chest Pain      HPI Pt reports 6pm pt was eating supper and felt pain in left chest and felt dizzy, lasted for less than 5 minutes. Pt seen at [facility] walk in clinic yesterday for weakness x 1 week. Pt still eports weakness. No chest pain or shortness of breath at this time. Past Medical History  Diagnosis Date  . Atrial fibrillation 02/24/2013  . TIA (transient ischemic attack)   . Diabetes mellitus without complication   . Hypertension   . Depression   . Osteopenia    Past Surgical History  Procedure Laterality Date  . Cyst removal trunk      from breast  . Cateracts      eyes  . Pacemaker insertion  2008; 07/18/2013    MDT dual chamber pacemaker implanted 2008 by Dr Leonia Reeves; gen change 2015 by Dr Caryl Comes  . Ablation  2012    AVN ablation by Dr Lovena Le  . Permanent pacemaker generator change N/A 07/18/2013    Procedure: PERMANENT PACEMAKER GENERATOR CHANGE;  Surgeon: Deboraha Sprang, MD;  Location: [facility] CATH LAB;  Service: Cardiovascular;  Laterality: N/A;   Family History  Problem Relation Age of Onset  . Stroke Father    History  Substance Use Topics  . Smoking status: Current Every Day Smoker  . Smokeless tobacco: Not on file  . Alcohol Use: No   OB History    No data available     Review of Systems  All other systems reviewed and are negative.     Allergies  Gabapentin; Lyrica; Rozerem; Temazepam; Tikosyn; and Tradjenta  Home Medications   Prior to Admission medications   Medication Sig Start Date End Date Taking? Authorizing Provider  apixaban (ELIQUIS) 2.5 MG TABS tablet Take 1 tablet (2.5 mg total) by mouth 2 (two) times daily. 08/08/14   Deboraha Sprang, MD  atorvastatin (LIPITOR) 10 MG tablet Take 5 mg by mouth daily. 1/2 tablet daily    Historical Provider, MD  Calcium Carbonate (CALCIUM 600  PO) Take 1 tablet by mouth 2 (two) times daily.    Historical Provider, MD  Cholecalciferol (VITAMIN D3) 2000 UNITS TABS Take 1 tablet by mouth daily.    Historical Provider, MD  escitalopram (LEXAPRO) 10 MG tablet Take 10 mg by mouth daily.    Historical Provider, MD  hydrochlorothiazide (MICROZIDE) 12.5 MG capsule Take 1 capsule (12.5 mg total) by mouth daily. 09/17/14   Leonard Schwartz, MD  LORazepam (ATIVAN) 1 MG tablet Take 1 mg by mouth at bedtime as needed for anxiety or sleep.     Historical Provider, MD  Multiple Vitamin (MULTIVITAMIN) tablet Take 1 tablet by mouth daily.    Historical Provider, MD  pregabalin (LYRICA) 50 MG capsule Take one capsule by mouth daily 05/09/14   Max T Hyatt, DPM  telmisartan (MICARDIS) 80 MG tablet Take 80 mg by mouth daily.    Historical Provider, MD   BP 144/76 mmHg  Pulse 63  Temp(Src) 97.8 F (36.6 C) (Oral)  Resp 23  SpO2 97% Physical Exam  Constitutional: She is oriented to person, place, and time. She appears well-developed and well-nourished. No distress.  HENT:  Head: Normocephalic and atraumatic.  Eyes: Pupils are equal, round, and reactive to light.  Neck: Normal range of  motion.  Cardiovascular: Normal rate and intact distal pulses.   Pulmonary/Chest: No respiratory distress.  Abdominal: Normal appearance. She exhibits no distension.  Musculoskeletal: Normal range of motion.  Neurological: She is alert and oriented to person, place, and time. No cranial nerve deficit.  Skin: Skin is warm and dry. No rash noted.  Psychiatric: She has a normal mood and affect. Her behavior is normal.  Nursing note and vitals reviewed.   ED Course  Procedures (including critical care time) Labs Review Labs Reviewed  CBC - Abnormal; Notable for the following:    Platelets 143 (*)    All other components within normal limits  BASIC METABOLIC PANEL - Abnormal; Notable for the following:    Chloride 98 (*)    Glucose, Bld 249 (*)    BUN 26 (*)     Creatinine, Ser 1.24 (*)    GFR calc non Af Amer 39 (*)    GFR calc Af Amer 45 (*)    All other components within normal limits  PROTIME-INR - Abnormal; Notable for the following:    Prothrombin Time 26.9 (*)    INR 2.46 (*)    All other components within normal limits  I-STAT CHEM 8, ED - Abnormal; Notable for the following:    Chloride 98 (*)    BUN 34 (*)    Creatinine, Ser 1.10 (*)    Glucose, Bld 161 (*)    Hemoglobin 16.3 (*)    HCT 48.0 (*)    All other components within normal limits  I-STAT TROPOININ, ED  I-STAT TROPOININ, ED  Randolm Idol, ED    Imaging Review Dg Chest Portable 1 View  09/17/2014   CLINICAL DATA:  Left-sided chest pain.  Pacemaker present.  EXAM: PORTABLE CHEST - 1 VIEW  COMPARISON:  Radiograph 06/16/ 2015  FINDINGS: Left-sided pacemaker overlies normal cardiac silhouette. No effusion, infiltrate, pneumothorax. Lungs are hyperinflated.  IMPRESSION: Hyperinflated lungs.  No acute findings.   Electronically Signed   By: Suzy Bouchard M.D.   On: 09/17/2014 21:26     EKG Interpretation   Date/Time:  Sunday Sep 17 2014 18:55:40 EDT Ventricular Rate:  74 PR Interval:    QRS Duration: 138 QT Interval:  424 QTC Calculation: 470 R Axis:   85 Text Interpretation:  Atrial flutter Electronic ventricular pacemaker  Abnormal ekg Abnormal ECG Confirmed by Tametra Ahart  MD, Ferol Laiche ([id number]) on  09/17/2014 8:06:34 PM     After treatment in the ED the patient feels back to baseline and wants to go home. MDM   Final diagnoses:  Essential hypertension  Hyperglycemia  Chest pain of uncertain etiology        Leonard Schwartz, MD 09/17/14 2258

## 2014-09-17 NOTE — Discharge Instructions (Signed)
Chest Pain (Nonspecific) °It is often hard to give a specific diagnosis for the cause of chest pain. There is always a chance that your pain could be related to something serious, such as a heart attack or a blood clot in the lungs. You need to follow up with your health care provider for further evaluation. °CAUSES  °· Heartburn. °· Pneumonia or bronchitis. °· Anxiety or stress. °· Inflammation around your heart (pericarditis) or lung (pleuritis or pleurisy). °· A blood clot in the lung. °· A collapsed lung (pneumothorax). It can develop suddenly on its own (spontaneous pneumothorax) or from trauma to the chest. °· Shingles infection (herpes zoster virus). °The chest wall is composed of bones, muscles, and cartilage. Any of these can be the source of the pain. °· The bones can be bruised by injury. °· The muscles or cartilage can be strained by coughing or overwork. °· The cartilage can be affected by inflammation and become sore (costochondritis). °DIAGNOSIS  °Lab tests or other studies may be needed to find the cause of your pain. Your health care provider may have you take a test called an ambulatory electrocardiogram (ECG). An ECG records your heartbeat patterns over a 24-hour period. You may also have other tests, such as: °· Transthoracic echocardiogram (TTE). During echocardiography, sound waves are used to evaluate how blood flows through your heart. °· Transesophageal echocardiogram (TEE). °· Cardiac monitoring. This allows your health care provider to monitor your heart rate and rhythm in real time. °· Holter monitor. This is a portable device that records your heartbeat and can help diagnose heart arrhythmias. It allows your health care provider to track your heart activity for several days, if needed. °· Stress tests by exercise or by giving medicine that makes the heart beat faster. °TREATMENT  °· Treatment depends on what may be causing your chest pain. Treatment may include: °· Acid blockers for  heartburn. °· Anti-inflammatory medicine. °· Pain medicine for inflammatory conditions. °· Antibiotics if an infection is present. °· You may be advised to change lifestyle habits. This includes stopping smoking and avoiding alcohol, caffeine, and chocolate. °· You may be advised to keep your head raised (elevated) when sleeping. This reduces the chance of acid going backward from your stomach into your esophagus. °Most of the time, nonspecific chest pain will improve within 2-3 days with rest and mild pain medicine.  °HOME CARE INSTRUCTIONS  °· If antibiotics were prescribed, take them as directed. Finish them even if you start to feel better. °· For the next few days, avoid physical activities that bring on chest pain. Continue physical activities as directed. °· Do not use any tobacco products, including cigarettes, chewing tobacco, or electronic cigarettes. °· Avoid drinking alcohol. °· Only take medicine as directed by your health care provider. °· Follow your health care provider's suggestions for further testing if your chest pain does not go away. °· Keep any follow-up appointments you made. If you do not go to an appointment, you could develop lasting (chronic) problems with pain. If there is any problem keeping an appointment, call to reschedule. °SEEK MEDICAL CARE IF:  °· Your chest pain does not go away, even after treatment. °· You have a rash with blisters on your chest. °· You have a fever. °SEEK IMMEDIATE MEDICAL CARE IF:  °· You have increased chest pain or pain that spreads to your arm, neck, jaw, back, or abdomen. °· You have shortness of breath. °· You have an increasing cough, or you cough   up blood.  You have severe back or abdominal pain.  You feel nauseous or vomit.  You have severe weakness.  You faint.  You have chills. This is an emergency. Do not wait to see if the pain will go away. Get medical help at once. Call your local emergency services (911 in [place] Do not drive  yourself to the hospital. MAKE SURE YOU:   Understand these instructions.  Will watch your condition.  Will get help right away if you are not doing well or get worse. Document Released: 02/12/2005 Document Revised: 05/10/2013 Document Reviewed: 12/09/2007 [facility] Patient Information 2015 [place], [place]. This information is not intended to replace advice given to you by your health care provider. Make sure you discuss any questions you have with your health care provider.  DASH Eating Plan DASH stands for "Dietary Approaches to Stop Hypertension." The DASH eating plan is a healthy eating plan that has been shown to reduce high blood pressure (hypertension). Additional health benefits may include reducing the risk of type 2 diabetes mellitus, heart disease, and stroke. The DASH eating plan may also help with weight loss. WHAT DO I NEED TO KNOW ABOUT THE DASH EATING PLAN? For the DASH eating plan, you will follow these general guidelines:  Choose foods with a percent daily value for sodium of less than 5% (as listed on the food label).  Use salt-free seasonings or herbs instead of table salt or sea salt.  Check with your health care provider or pharmacist before using salt substitutes.  Eat lower-sodium products, often labeled as "lower sodium" or "no salt added."  Eat fresh foods.  Eat more vegetables, fruits, and low-fat dairy products.  Choose whole grains. Look for the word "whole" as the first word in the ingredient list.  Choose fish and skinless chicken or [place] more often than red meat. Limit fish, poultry, and meat to 6 oz (170 g) each day.  Limit sweets, desserts, sugars, and sugary drinks.  Choose heart-healthy fats.  Limit cheese to 1 oz (28 g) per day.  Eat more home-cooked food and less restaurant, buffet, and fast food.  Limit fried foods.  Cook foods using methods other than frying.  Limit canned vegetables. If you do use them, rinse them well to decrease  the sodium.  When eating at a restaurant, ask that your food be prepared with less salt, or no salt if possible. WHAT FOODS CAN I EAT? Seek help from a dietitian for individual calorie needs. Grains Whole grain or whole wheat bread. Brown rice. Whole grain or whole wheat pasta. Quinoa, bulgur, and whole grain cereals. Low-sodium cereals. Corn or whole wheat flour tortillas. Whole grain cornbread. Whole grain crackers. Low-sodium crackers. Vegetables Fresh or frozen vegetables (raw, steamed, roasted, or grilled). Low-sodium or reduced-sodium tomato and vegetable juices. Low-sodium or reduced-sodium tomato sauce and paste. Low-sodium or reduced-sodium canned vegetables.  Fruits All fresh, canned (in natural juice), or frozen fruits. Meat and Other Protein Products Ground beef (85% or leaner), grass-fed beef, or beef trimmed of fat. Skinless chicken or [place]. Ground chicken or [place]. Pork trimmed of fat. All fish and seafood. Eggs. Dried beans, peas, or lentils. Unsalted nuts and seeds. Unsalted canned beans. Dairy Low-fat dairy products, such as skim or 1% milk, 2% or reduced-fat cheeses, low-fat ricotta or cottage cheese, or plain low-fat yogurt. Low-sodium or reduced-sodium cheeses. Fats and Oils Tub margarines without trans fats. Light or reduced-fat mayonnaise and salad dressings (reduced sodium). Avocado. Safflower, olive, or canola oils.  Natural peanut or almond butter. Other Unsalted popcorn and pretzels. The items listed above may not be a complete list of recommended foods or beverages. Contact your dietitian for more options. WHAT FOODS ARE NOT RECOMMENDED? Grains White bread. White pasta. White rice. Refined cornbread. Bagels and croissants. Crackers that contain trans fat. Vegetables Creamed or fried vegetables. Vegetables in a cheese sauce. Regular canned vegetables. Regular canned tomato sauce and paste. Regular tomato and vegetable juices. Fruits Dried fruits. Canned fruit in  light or heavy syrup. Fruit juice. Meat and Other Protein Products Fatty cuts of meat. Ribs, chicken wings, bacon, sausage, bologna, salami, chitterlings, fatback, hot dogs, bratwurst, and packaged luncheon meats. Salted nuts and seeds. Canned beans with salt. Dairy Whole or 2% milk, cream, half-and-half, and cream cheese. Whole-fat or sweetened yogurt. Full-fat cheeses or blue cheese. Nondairy creamers and whipped toppings. Processed cheese, cheese spreads, or cheese curds. Condiments Onion and garlic salt, seasoned salt, table salt, and sea salt. Canned and packaged gravies. Worcestershire sauce. Tartar sauce. Barbecue sauce. Teriyaki sauce. Soy sauce, including reduced sodium. Steak sauce. Fish sauce. Oyster sauce. Cocktail sauce. Horseradish. Ketchup and mustard. Meat flavorings and tenderizers. Bouillon cubes. Hot sauce. Tabasco sauce. Marinades. Taco seasonings. Relishes. Fats and Oils Butter, stick margarine, lard, shortening, ghee, and bacon fat. Coconut, palm kernel, or palm oils. Regular salad dressings. Other Pickles and olives. Salted popcorn and pretzels. The items listed above may not be a complete list of foods and beverages to avoid. Contact your dietitian for more information. WHERE CAN I FIND MORE INFORMATION? National Heart, Lung, and Blood Institute: travelstabloid.com Document Released: 04/24/2011 Document Revised: 09/19/2013 Document Reviewed: 03/09/2013 [facility] Patient Information 2015 [place], [place]. This information is not intended to replace advice given to you by your health care provider. Make sure you discuss any questions you have with your health care provider.  Hypertension Hypertension, commonly called high blood pressure, is when the force of blood pumping through your arteries is too strong. Your arteries are the blood vessels that carry blood from your heart throughout your body. A blood pressure reading consists of a higher  number over a lower number, such as 110/72. The higher number (systolic) is the pressure inside your arteries when your heart pumps. The lower number (diastolic) is the pressure inside your arteries when your heart relaxes. Ideally you want your blood pressure below 120/80. Hypertension forces your heart to work harder to pump blood. Your arteries may become narrow or stiff. Having hypertension puts you at risk for heart disease, stroke, and other problems.  RISK FACTORS Some risk factors for high blood pressure are controllable. Others are not.  Risk factors you cannot control include:   Race. You may be at higher risk if you are African American.  Age. Risk increases with age.  Gender. Men are at higher risk than women before age 60 years. After age 24, women are at higher risk than men. Risk factors you can control include:  Not getting enough exercise or physical activity.  Being overweight.  Getting too much fat, sugar, calories, or salt in your diet.  Drinking too much alcohol. SIGNS AND SYMPTOMS Hypertension does not usually cause signs or symptoms. Extremely high blood pressure (hypertensive crisis) may cause headache, anxiety, shortness of breath, and nosebleed. DIAGNOSIS  To check if you have hypertension, your health care provider will measure your blood pressure while you are seated, with your arm held at the level of your heart. It should be measured at least twice using  the same arm. Certain conditions can cause a difference in blood pressure between your right and left arms. A blood pressure reading that is higher than normal on one occasion does not mean that you need treatment. If one blood pressure reading is high, ask your health care provider about having it checked again. TREATMENT  Treating high blood pressure includes making lifestyle changes and possibly taking medicine. Living a healthy lifestyle can help lower high blood pressure. You may need to change some of your  habits. Lifestyle changes may include:  Following the DASH diet. This diet is high in fruits, vegetables, and whole grains. It is low in salt, red meat, and added sugars.  Getting at least 2 hours of brisk physical activity every week.  Losing weight if necessary.  Not smoking.  Limiting alcoholic beverages.  Learning ways to reduce stress. If lifestyle changes are not enough to get your blood pressure under control, your health care provider may prescribe medicine. You may need to take more than one. Work closely with your health care provider to understand the risks and benefits. HOME CARE INSTRUCTIONS  Have your blood pressure rechecked as directed by your health care provider.   Take medicines only as directed by your health care provider. Follow the directions carefully. Blood pressure medicines must be taken as prescribed. The medicine does not work as well when you skip doses. Skipping doses also puts you at risk for problems.   Do not smoke.   Monitor your blood pressure at home as directed by your health care provider. SEEK MEDICAL CARE IF:   You think you are having a reaction to medicines taken.  You have recurrent headaches or feel dizzy.  You have swelling in your ankles.  You have trouble with your vision. SEEK IMMEDIATE MEDICAL CARE IF:  You develop a severe headache or confusion.  You have unusual weakness, numbness, or feel faint.  You have severe chest or abdominal pain.  You vomit repeatedly.  You have trouble breathing. MAKE SURE YOU:   Understand these instructions.  Will watch your condition.  Will get help right away if you are not doing well or get worse. Document Released: 05/05/2005 Document Revised: 09/19/2013 Document Reviewed: 02/25/2013 [facility] Patient Information 2015 [place], [place]. This information is not intended to replace advice given to you by your health care provider. Make sure you discuss any questions you have with  your health care provider.

## 2014-09-17 NOTE — ED Notes (Signed)
Blood pressure not accurate.  Moving arm.  Retaken.

## 2014-09-18 ENCOUNTER — Ambulatory Visit (INDEPENDENT_AMBULATORY_CARE_PROVIDER_SITE_OTHER): Payer: Medicare Other

## 2014-09-18 DIAGNOSIS — Z5181 Encounter for therapeutic drug level monitoring: Secondary | ICD-10-CM

## 2014-09-18 DIAGNOSIS — I4891 Unspecified atrial fibrillation: Secondary | ICD-10-CM

## 2014-09-18 LAB — POCT INR: INR: 2.8

## 2014-09-25 ENCOUNTER — Ambulatory Visit (INDEPENDENT_AMBULATORY_CARE_PROVIDER_SITE_OTHER): Payer: Medicare Other

## 2014-09-25 DIAGNOSIS — Z5181 Encounter for therapeutic drug level monitoring: Secondary | ICD-10-CM | POA: Diagnosis not present

## 2014-09-25 DIAGNOSIS — I4891 Unspecified atrial fibrillation: Secondary | ICD-10-CM

## 2014-09-25 LAB — POCT INR: INR: 3

## 2014-10-09 ENCOUNTER — Ambulatory Visit (INDEPENDENT_AMBULATORY_CARE_PROVIDER_SITE_OTHER): Payer: Medicare Other

## 2014-10-09 DIAGNOSIS — I4891 Unspecified atrial fibrillation: Secondary | ICD-10-CM | POA: Diagnosis not present

## 2014-10-09 DIAGNOSIS — Z5181 Encounter for therapeutic drug level monitoring: Secondary | ICD-10-CM | POA: Diagnosis not present

## 2014-10-09 LAB — POCT INR: INR: 2.6

## 2014-10-26 ENCOUNTER — Ambulatory Visit: Payer: Medicare Other | Admitting: Podiatry

## 2014-10-30 ENCOUNTER — Ambulatory Visit (INDEPENDENT_AMBULATORY_CARE_PROVIDER_SITE_OTHER): Payer: Medicare Other | Admitting: *Deleted

## 2014-10-30 DIAGNOSIS — I4891 Unspecified atrial fibrillation: Secondary | ICD-10-CM | POA: Diagnosis not present

## 2014-10-30 DIAGNOSIS — Z5181 Encounter for therapeutic drug level monitoring: Secondary | ICD-10-CM | POA: Diagnosis not present

## 2014-10-30 LAB — POCT INR: INR: 2.1

## 2014-10-31 ENCOUNTER — Ambulatory Visit (INDEPENDENT_AMBULATORY_CARE_PROVIDER_SITE_OTHER): Payer: Medicare Other | Admitting: Podiatry

## 2014-10-31 DIAGNOSIS — M79672 Pain in left foot: Secondary | ICD-10-CM

## 2014-10-31 DIAGNOSIS — B351 Tinea unguium: Secondary | ICD-10-CM | POA: Diagnosis not present

## 2014-10-31 DIAGNOSIS — M79606 Pain in leg, unspecified: Secondary | ICD-10-CM

## 2014-10-31 DIAGNOSIS — E1142 Type 2 diabetes mellitus with diabetic polyneuropathy: Secondary | ICD-10-CM

## 2014-10-31 DIAGNOSIS — E1342 Other specified diabetes mellitus with diabetic polyneuropathy: Secondary | ICD-10-CM

## 2014-10-31 MED ORDER — PREGABALIN 50 MG PO CAPS: ORAL_CAPSULE | ORAL | Status: DC

## 2014-10-31 MED ORDER — PREGABALIN 50 MG PO CAPS: 50.0000 mg | ORAL_CAPSULE | Freq: Three times a day (TID) | ORAL | Status: DC

## 2014-10-31 NOTE — Progress Notes (Signed)
Patient ID: [name], female   DOB: Jul 19, 1928, 81 y.o.   MRN: [id number] Complaint:  Visit Type: Patient returns to my office for continued preventative foot care services. Complaint: Patient states" my nails have grown long and thick and become painful to walk and wear shoes" Patient has been diagnosed with DM with no complications. He presents for preventative foot care services. No changes to ROS  Podiatric Exam: Vascular: dorsalis pedis and posterior tibial pulses are palpable bilateral. Capillary return is immediate. Temperature gradient is WNL. Skin turgor WNL  Sensorium: Normal Semmes Weinstein monofilament test. Normal tactile sensation bilaterally. Nail Exam: Pt has thick disfigured discolored nails with subungual debris noted bilateral entire nail hallux through fifth toenails Ulcer Exam: There is no evidence of ulcer or pre-ulcerative changes or infection. Orthopedic Exam: Muscle tone and strength are WNL. No limitations in general ROM. No crepitus or effusions noted. Foot type and digits show no abnormalities. Bony prominences are unremarkable. Skin: No Porokeratosis. No infection or ulcers  Diagnosis:  Tinea unguium, Pain in right toe, pain in left toes  Treatment & Plan Procedures and Treatment: Consent by patient was obtained for treatment procedures. The patient understood the discussion of treatment and procedures well. All questions were answered thoroughly reviewed. Debridement of mycotic and hypertrophic toenails, 1 through 5 bilateral and clearing of subungual debris. No ulceration, no infection noted.  Return Visit-Office Procedure: Patient instructed to return to the office for a follow up visit 3 months for continued evaluation and treatment. Patient requested refill on her Lyrica

## 2014-11-02 ENCOUNTER — Ambulatory Visit (INDEPENDENT_AMBULATORY_CARE_PROVIDER_SITE_OTHER): Payer: Medicare Other | Admitting: *Deleted

## 2014-11-02 DIAGNOSIS — I495 Sick sinus syndrome: Secondary | ICD-10-CM

## 2014-11-02 NOTE — Progress Notes (Signed)
Remote pacemaker transmission.   

## 2014-11-06 LAB — CUP PACEART REMOTE DEVICE CHECK
Battery Impedance: 100 Ohm
Battery Remaining Longevity: 147 mo
Battery Voltage: 2.79 V
Brady Statistic RV Percent Paced: 100 %
Date Time Interrogation Session: 20160616124935
Lead Channel Impedance Value: 493 Ohm
Lead Channel Impedance Value: 67 Ohm
Lead Channel Pacing Threshold Amplitude: 0.625 V
Lead Channel Pacing Threshold Pulse Width: 0.4 ms
Lead Channel Setting Pacing Amplitude: 2.5 V
Lead Channel Setting Pacing Pulse Width: 0.4 ms
Lead Channel Setting Sensing Sensitivity: 2.8 mV

## 2014-11-24 ENCOUNTER — Encounter: Payer: Self-pay | Admitting: Cardiology

## 2014-11-27 ENCOUNTER — Ambulatory Visit (INDEPENDENT_AMBULATORY_CARE_PROVIDER_SITE_OTHER): Payer: Medicare Other

## 2014-11-27 DIAGNOSIS — I4891 Unspecified atrial fibrillation: Secondary | ICD-10-CM | POA: Diagnosis not present

## 2014-11-27 DIAGNOSIS — Z5181 Encounter for therapeutic drug level monitoring: Secondary | ICD-10-CM

## 2014-11-27 LAB — POCT INR: INR: 1.8

## 2014-11-30 ENCOUNTER — Encounter: Payer: Self-pay | Admitting: Internal Medicine

## 2014-12-04 ENCOUNTER — Other Ambulatory Visit: Payer: Self-pay | Admitting: Cardiology

## 2014-12-25 ENCOUNTER — Ambulatory Visit (INDEPENDENT_AMBULATORY_CARE_PROVIDER_SITE_OTHER): Payer: Medicare Other

## 2014-12-25 DIAGNOSIS — Z5181 Encounter for therapeutic drug level monitoring: Secondary | ICD-10-CM | POA: Diagnosis not present

## 2014-12-25 DIAGNOSIS — I4891 Unspecified atrial fibrillation: Secondary | ICD-10-CM

## 2014-12-25 LAB — POCT INR: INR: 1.9

## 2015-01-15 ENCOUNTER — Ambulatory Visit (INDEPENDENT_AMBULATORY_CARE_PROVIDER_SITE_OTHER): Payer: Medicare Other | Admitting: Pharmacist

## 2015-01-15 DIAGNOSIS — Z5181 Encounter for therapeutic drug level monitoring: Secondary | ICD-10-CM | POA: Diagnosis not present

## 2015-01-15 DIAGNOSIS — I4891 Unspecified atrial fibrillation: Secondary | ICD-10-CM

## 2015-01-15 LAB — POCT INR: INR: 2.3

## 2015-02-05 ENCOUNTER — Ambulatory Visit (INDEPENDENT_AMBULATORY_CARE_PROVIDER_SITE_OTHER): Payer: Medicare Other | Admitting: *Deleted

## 2015-02-05 DIAGNOSIS — I495 Sick sinus syndrome: Secondary | ICD-10-CM

## 2015-02-05 NOTE — Progress Notes (Signed)
Remote pacemaker transmission.   

## 2015-02-06 ENCOUNTER — Ambulatory Visit (INDEPENDENT_AMBULATORY_CARE_PROVIDER_SITE_OTHER): Payer: Medicare Other | Admitting: Podiatry

## 2015-02-06 ENCOUNTER — Encounter: Payer: Self-pay | Admitting: Podiatry

## 2015-02-06 DIAGNOSIS — E1142 Type 2 diabetes mellitus with diabetic polyneuropathy: Secondary | ICD-10-CM

## 2015-02-06 DIAGNOSIS — B351 Tinea unguium: Secondary | ICD-10-CM | POA: Diagnosis not present

## 2015-02-06 DIAGNOSIS — M79606 Pain in leg, unspecified: Secondary | ICD-10-CM | POA: Diagnosis not present

## 2015-02-06 DIAGNOSIS — G629 Polyneuropathy, unspecified: Secondary | ICD-10-CM

## 2015-02-06 DIAGNOSIS — E1342 Other specified diabetes mellitus with diabetic polyneuropathy: Secondary | ICD-10-CM

## 2015-02-06 NOTE — Progress Notes (Signed)
Patient ID: [name], female   DOB: 24-Oct-1928, 81 y.o.   MRN: [id number] Complaint:  Visit Type: Patient returns to my office for continued preventative foot care services. Complaint: Patient states" my nails have grown long and thick and become painful to walk and wear shoes" Patient has been diagnosed with DM with no complications. He presents for preventative foot care services. No changes to ROS  Podiatric Exam: Vascular: dorsalis pedis and posterior tibial pulses are palpable bilateral. Capillary return is immediate. Temperature gradient is WNL. Skin turgor WNL  Sensorium: Normal Semmes Weinstein monofilament test. Normal tactile sensation bilaterally. Nail Exam: Pt has thick disfigured discolored nails with subungual debris noted bilateral entire nail hallux through fifth toenails Ulcer Exam: There is no evidence of ulcer or pre-ulcerative changes or infection. Orthopedic Exam: Muscle tone and strength are WNL. No limitations in general ROM. No crepitus or effusions noted. Foot type and digits show no abnormalities. Bony prominences are unremarkable. Skin: No Porokeratosis. No infection or ulcers  Diagnosis:  Tinea unguium, Pain in right toe, pain in left toes  Treatment & Plan Procedures and Treatment: Consent by patient was obtained for treatment procedures. The patient understood the discussion of treatment and procedures well. All questions were answered thoroughly reviewed. Debridement of mycotic and hypertrophic toenails, 1 through 5 bilateral and clearing of subungual debris. No ulceration, no infection noted.  Return Visit-Office Procedure: Patient instructed to return to the office for a follow up visit 3 months for continued evaluation and treatment.

## 2015-02-12 ENCOUNTER — Ambulatory Visit (INDEPENDENT_AMBULATORY_CARE_PROVIDER_SITE_OTHER): Payer: Medicare Other | Admitting: *Deleted

## 2015-02-12 ENCOUNTER — Encounter: Payer: Self-pay | Admitting: Cardiology

## 2015-02-12 ENCOUNTER — Ambulatory Visit (INDEPENDENT_AMBULATORY_CARE_PROVIDER_SITE_OTHER): Payer: Medicare Other | Admitting: Cardiology

## 2015-02-12 VITALS — BP 120/58 | HR 68 | Ht 63.0 in | Wt 98.1 lb

## 2015-02-12 DIAGNOSIS — Z950 Presence of cardiac pacemaker: Secondary | ICD-10-CM

## 2015-02-12 DIAGNOSIS — I4891 Unspecified atrial fibrillation: Secondary | ICD-10-CM | POA: Diagnosis not present

## 2015-02-12 DIAGNOSIS — Z5181 Encounter for therapeutic drug level monitoring: Secondary | ICD-10-CM

## 2015-02-12 DIAGNOSIS — I4430 Unspecified atrioventricular block: Secondary | ICD-10-CM | POA: Diagnosis not present

## 2015-02-12 DIAGNOSIS — Z720 Tobacco use: Secondary | ICD-10-CM | POA: Diagnosis not present

## 2015-02-12 DIAGNOSIS — I480 Paroxysmal atrial fibrillation: Secondary | ICD-10-CM | POA: Diagnosis not present

## 2015-02-12 LAB — POCT INR: INR: 2.7

## 2015-02-12 NOTE — Patient Instructions (Signed)
Medication Instructions:  The current medical regimen is effective;  continue present plan and medications.  Follow-Up: Follow up in 6 months with Dr. Skains.  You will receive a letter in the mail 2 months before you are due.  Please call us when you receive this letter to schedule your follow up appointment.  Thank you for choosing [facility]!!     

## 2015-02-12 NOTE — Progress Notes (Signed)
[facility]. [address], [place]  [address] Phone: [phone] Fax:  [phone]  Date:  02/12/2015   ID:  [name], DOB February 15, 1929, MRN [id number]  PCP:  Sherri Kroner, MD   History of Present Illness: [name] is a 81 y.o. female with history of permanent atrial fibrillation, status post AV nodal ablation in 2010 for persistent symptoms. Diabetes, EF 45% likely from chronic pacing. Septal wall hypokinesis noted a nuclear stress test which actually showed an EF of 64%. Her pacemaker has been functioning normally. Underlying atrial fibrillation noted.  Had joint pain from Trajenta.  Still continues to smoke. Cough. No fevers noted.  No fevers.  [name], who previously worked with me at [facility], saw her at previous visit.  She did not like taking Eliquis. Would rather stay on Coumadin. This is fine. Her levels have been stable.     Wt Readings from Last 3 Encounters:  02/12/15 98 lb 1.9 oz (44.507 kg)  08/16/14 104 lb (47.174 kg)  08/01/14 103 lb 9.6 oz (46.993 kg)     Past Medical History  Diagnosis Date  . Atrial fibrillation 02/24/2013  . TIA (transient ischemic attack)   . Diabetes mellitus without complication   . Hypertension   . Depression   . Osteopenia     Past Surgical History  Procedure Laterality Date  . Cyst removal trunk      from breast  . Cateracts      eyes  . Pacemaker insertion  2008; 07/18/2013    MDT dual chamber pacemaker implanted 2008 by Dr [name]; gen change 2015 by Dr [name]  . Ablation  2012    AVN ablation by Dr [name]  . Permanent pacemaker generator change N/A 07/18/2013    Procedure: PERMANENT PACEMAKER GENERATOR CHANGE;  Surgeon: Sherri Sprang, MD;  Location: [facility] CATH LAB;  Service: Cardiovascular;  Laterality: N/A;    Current Outpatient Prescriptions  Medication Sig Dispense Refill  . atorvastatin (LIPITOR) 10 MG tablet Take 5 mg by mouth daily. 1/2 tablet daily    . Calcium Carbonate (CALCIUM 600 PO) Take 1  tablet by mouth 2 (two) times daily.    . Cholecalciferol (VITAMIN D3) 2000 UNITS TABS Take 1 tablet by mouth daily.    Marland Kitchen escitalopram (LEXAPRO) 20 MG tablet Take 20 mg by mouth daily.  0  . Eszopiclone 3 MG TABS Take 3 mg by mouth at bedtime. Take immediately before bedtime    . hydrochlorothiazide (MICROZIDE) 12.5 MG capsule Take 1 capsule (12.5 mg total) by mouth daily. 30 capsule 0  . LORazepam (ATIVAN) 1 MG tablet Take 1 mg by mouth at bedtime as needed for anxiety or sleep.     . Multiple Vitamin (MULTIVITAMIN) tablet Take 1 tablet by mouth daily.    . pregabalin (LYRICA) 50 MG capsule Take 1 capsule (50 mg total) by mouth 3 (three) times daily. 30 capsule 11  . pregabalin (LYRICA) 50 MG capsule Take one capsule by mouth daily 30 capsule 11  . telmisartan (MICARDIS) 80 MG tablet Take 80 mg by mouth daily.    Marland Kitchen warfarin (COUMADIN) 1 MG tablet take as directed BY COUMADIN CLINIC 60 tablet 3   No current facility-administered medications for this visit.    Allergies:    Allergies  Allergen Reactions  . Eliquis [Apixaban] Other (See Comments)    Made pt have weak muscles  . Gabapentin     SHAKES AND NAUSEA   .  Lyrica [Pregabalin]     DIZZY  . Rozerem [Ramelteon]     DIZZY AND HEAD THROBBING  . Temazepam     INSOMNIA  . Tikosyn [Dofetilide]     Mouth swells, itching  . Tradjenta [Linagliptin] Diarrhea    Joint Pain, Stomach issues    Social History:  The patient  reports that she has been smoking.  She does not have any smokeless tobacco history on file. She reports that she does not drink alcohol or use illicit drugs.   ROS:  Please see the history of present illness.   Denies any bleeding, syncope, new strokelike symptoms. No dizziness, no chest pain. Positive for cough, occasional depression, snoring, anxiety, leg pain, excessive fatigue.   PHYSICAL EXAM: VS:  BP 120/58 mmHg  Pulse 58  Ht 5\' 3"  (1.6 m)  Wt 98 lb 1.9 oz (44.507 kg)  BMI 17.39 kg/m2  SpO2 96% Thin, in  no acute distress HEENT: normal Neck: no JVD Cardiac:  normal S1, S2; RRR; no murmur Lungs:  + wheezing.  Abd: soft, nontender, no hepatomegaly Ext: no edema Skin: warm and dry Neuro: no focal abnormalities noted  EKG:  Atrial fibrillation rate 71, ventricular pacing.    Echocardiogram: 08/08/14-normal ejection fraction, there was an oscillating density on pacemaker lead.  ASSESSMENT AND PLAN:  1. Atrial fibrillation- CHADS-VASc 5. Off Eliquis back on coumadin (leg were rubbery). . Off of her aspirin. AV nodal ablation. 2. Essential hypertension-blood pressure excellent. I asked her if she would like to come off of her hydrochlorothiazide 12.5 mg but she states that she needs this medication in order to keep her blood pressure under check. We will not make any adjustments. Her main complaint is fatigue. Encouraged her to continue with activity. 3. Pacemaker-reviewed office note on from Dr. Caryl [name]. Pacemaker battery update occurred on 07/18/13. 4. Protein calorie malnutrition-BMI 17. Encouraged protein, this will also help with fatigue. 5. Tobacco use-encourage cessation. She does not wish to quit. 6. COPD/bronchitis-likely from extensive smoking history. Cough heard today. No wheezing heard today. Continue to work with Dr. Moreen [name] 7. AV block/AV nodal ablation-permanent pacemaker required. Pacemaker dependent. Dr. Caryl [name] watching.   Signed, Sherri Furbish, MD [facility]  02/12/2015 1:58 PM

## 2015-02-13 LAB — CUP PACEART REMOTE DEVICE CHECK
Battery Impedance: 112 Ohm
Battery Remaining Longevity: 143 mo
Battery Voltage: 2.79 V
Brady Statistic RV Percent Paced: 100 %
Date Time Interrogation Session: 20160919130622
Lead Channel Impedance Value: 493 Ohm
Lead Channel Impedance Value: 67 Ohm
Lead Channel Pacing Threshold Amplitude: 0.625 V
Lead Channel Pacing Threshold Pulse Width: 0.4 ms
Lead Channel Setting Pacing Amplitude: 2.5 V
Lead Channel Setting Pacing Pulse Width: 0.4 ms
Lead Channel Setting Sensing Sensitivity: 2.8 mV

## 2015-02-27 ENCOUNTER — Encounter: Payer: Self-pay | Admitting: Cardiology

## 2015-03-06 ENCOUNTER — Telehealth: Payer: Self-pay | Admitting: *Deleted

## 2015-03-06 MED ORDER — PREGABALIN 50 MG PO CAPS: 50.0000 mg | ORAL_CAPSULE | Freq: Every day | ORAL | Status: DC

## 2015-03-06 NOTE — Telephone Encounter (Signed)
Pt states Lyrica 50mg  was written for tid instead of daily.  Pt states she only take Lyrica 50mg  one capsule at bedtime, never 3 times a day.  I changed the Lyrica rx back to the original daily dosing ordered by Dr. Milinda Pointer.

## 2015-03-08 ENCOUNTER — Encounter: Payer: Self-pay | Admitting: Internal Medicine

## 2015-03-12 ENCOUNTER — Ambulatory Visit (INDEPENDENT_AMBULATORY_CARE_PROVIDER_SITE_OTHER): Payer: Medicare Other | Admitting: *Deleted

## 2015-03-12 DIAGNOSIS — I480 Paroxysmal atrial fibrillation: Secondary | ICD-10-CM

## 2015-03-12 DIAGNOSIS — Z5181 Encounter for therapeutic drug level monitoring: Secondary | ICD-10-CM

## 2015-03-12 DIAGNOSIS — I4891 Unspecified atrial fibrillation: Secondary | ICD-10-CM

## 2015-03-12 LAB — POCT INR: INR: 2.4

## 2015-04-16 ENCOUNTER — Ambulatory Visit (INDEPENDENT_AMBULATORY_CARE_PROVIDER_SITE_OTHER): Payer: Medicare Other | Admitting: *Deleted

## 2015-04-16 DIAGNOSIS — I480 Paroxysmal atrial fibrillation: Secondary | ICD-10-CM

## 2015-04-16 DIAGNOSIS — Z5181 Encounter for therapeutic drug level monitoring: Secondary | ICD-10-CM | POA: Diagnosis not present

## 2015-04-16 DIAGNOSIS — I4891 Unspecified atrial fibrillation: Secondary | ICD-10-CM | POA: Diagnosis not present

## 2015-04-16 LAB — POCT INR: INR: 3

## 2015-04-16 MED ORDER — WARFARIN SODIUM 1 MG PO TABS: ORAL_TABLET | ORAL | Status: DC

## 2015-04-24 ENCOUNTER — Telehealth: Payer: Self-pay | Admitting: *Deleted

## 2015-04-24 NOTE — Telephone Encounter (Signed)
Pt states she has questions about Lyrica. I spoke with pt and she said she had previously paid $30.00, but this time $90.00.  I checked the last Lyrica order and it was for 90.  I asked the pt if she always got 90 and she stated that usually #30, and the bottle she had now had #30 on it. I told the pt if that was the current bottle of Lyrica she need to let the pharmacy know she did not get her full rx.

## 2015-05-03 ENCOUNTER — Telehealth: Payer: Self-pay | Admitting: *Deleted

## 2015-05-03 NOTE — Telephone Encounter (Addendum)
Pt states the Lyrica is $90.00 for 30 capsule and she can't afford.  Pt states a friend is on Gabapentin, maybe that would work for her. 05/03/2015 - Pt states Lyrica is too expensive, and a friend is on Gabapentin, would that work for her?

## 2015-05-06 NOTE — Telephone Encounter (Signed)
Gabapentin is listed as one of her allergies.

## 2015-05-07 ENCOUNTER — Ambulatory Visit (INDEPENDENT_AMBULATORY_CARE_PROVIDER_SITE_OTHER): Payer: Medicare Other | Admitting: *Deleted

## 2015-05-07 ENCOUNTER — Telehealth: Payer: Self-pay | Admitting: Cardiology

## 2015-05-07 DIAGNOSIS — I442 Atrioventricular block, complete: Secondary | ICD-10-CM

## 2015-05-07 NOTE — Progress Notes (Signed)
Remote pacemaker transmission.   

## 2015-05-07 NOTE — Telephone Encounter (Signed)
Spoke with pt and reminded pt of remote transmission that is due today. Pt verbalized understanding.   

## 2015-05-08 ENCOUNTER — Ambulatory Visit: Payer: Medicare Other | Admitting: Podiatry

## 2015-05-08 LAB — CUP PACEART REMOTE DEVICE CHECK
Battery Impedance: 112 Ohm
Battery Remaining Longevity: 145 mo
Battery Voltage: 2.78 V
Brady Statistic RV Percent Paced: 100 %
Date Time Interrogation Session: 20161219153053
Implantable Lead Implant Date: 20080416
Implantable Lead Implant Date: 20080416
Implantable Lead Location: 753859
Implantable Lead Location: 753860
Implantable Lead Model: 4076
Implantable Lead Model: 5076
Lead Channel Impedance Value: 544 Ohm
Lead Channel Impedance Value: 67 Ohm
Lead Channel Pacing Threshold Amplitude: 0.5 V
Lead Channel Pacing Threshold Pulse Width: 0.4 ms
Lead Channel Setting Pacing Amplitude: 2.5 V
Lead Channel Setting Pacing Pulse Width: 0.4 ms
Lead Channel Setting Sensing Sensitivity: 2.8 mV

## 2015-05-10 ENCOUNTER — Encounter: Payer: Self-pay | Admitting: Cardiology

## 2015-05-23 ENCOUNTER — Ambulatory Visit (INDEPENDENT_AMBULATORY_CARE_PROVIDER_SITE_OTHER): Payer: Medicare Other | Admitting: Pharmacist

## 2015-05-23 DIAGNOSIS — Z5181 Encounter for therapeutic drug level monitoring: Secondary | ICD-10-CM

## 2015-05-23 DIAGNOSIS — I4891 Unspecified atrial fibrillation: Secondary | ICD-10-CM | POA: Diagnosis not present

## 2015-05-23 DIAGNOSIS — I480 Paroxysmal atrial fibrillation: Secondary | ICD-10-CM

## 2015-05-23 LAB — POCT INR: INR: 2.9

## 2015-06-12 ENCOUNTER — Encounter: Payer: Self-pay | Admitting: Podiatry

## 2015-06-12 ENCOUNTER — Ambulatory Visit (INDEPENDENT_AMBULATORY_CARE_PROVIDER_SITE_OTHER): Payer: Medicare Other | Admitting: Podiatry

## 2015-06-12 DIAGNOSIS — B351 Tinea unguium: Secondary | ICD-10-CM | POA: Diagnosis not present

## 2015-06-12 DIAGNOSIS — M79675 Pain in left toe(s): Secondary | ICD-10-CM

## 2015-06-12 DIAGNOSIS — E1142 Type 2 diabetes mellitus with diabetic polyneuropathy: Secondary | ICD-10-CM

## 2015-06-12 DIAGNOSIS — M79606 Pain in leg, unspecified: Secondary | ICD-10-CM

## 2015-06-12 NOTE — Progress Notes (Signed)
Patient ID: GRASYN MARCIAL, female   DOB: Oct 27, 1928, 82 y.o.   MRN: [id number] Complaint:  Visit Type: Patient returns to my office for continued preventative foot care services. Complaint: Patient states" my nails have grown long and thick and become painful to walk and wear shoes" Patient has been diagnosed with DM with no complications. He presents for preventative foot care services. No changes to ROS  Podiatric Exam: Vascular: dorsalis pedis and posterior tibial pulses are palpable bilateral. Capillary return is immediate. Temperature gradient is WNL. Skin turgor WNL  Sensorium: Normal Semmes Weinstein monofilament test. Normal tactile sensation bilaterally. Nail Exam: Pt has thick disfigured discolored nails with subungual debris noted bilateral entire nail hallux through fifth toenails Ulcer Exam: There is no evidence of ulcer or pre-ulcerative changes or infection. Orthopedic Exam: Muscle tone and strength are WNL. No limitations in general ROM. No crepitus or effusions noted. Foot type and digits show no abnormalities. Bony prominences are unremarkable. Skin: No Porokeratosis. No infection or ulcers  Diagnosis:  Tinea unguium, Pain in right toe, pain in left toes  Treatment & Plan Procedures and Treatment: Consent by patient was obtained for treatment procedures. The patient understood the discussion of treatment and procedures well. All questions were answered thoroughly reviewed. Debridement of mycotic and hypertrophic toenails, 1 through 5 bilateral and clearing of subungual debris. No ulceration, no infection noted.  Return Visit-Office Procedure: Patient instructed to return to the office for a follow up visit 3 months for continued evaluation and treatment.    [name] DPM

## 2015-07-02 ENCOUNTER — Ambulatory Visit (INDEPENDENT_AMBULATORY_CARE_PROVIDER_SITE_OTHER): Payer: Medicare Other | Admitting: Pharmacist

## 2015-07-02 DIAGNOSIS — Z5181 Encounter for therapeutic drug level monitoring: Secondary | ICD-10-CM

## 2015-07-02 DIAGNOSIS — I4891 Unspecified atrial fibrillation: Secondary | ICD-10-CM | POA: Diagnosis not present

## 2015-07-02 DIAGNOSIS — I480 Paroxysmal atrial fibrillation: Secondary | ICD-10-CM | POA: Diagnosis not present

## 2015-07-02 DIAGNOSIS — E119 Type 2 diabetes mellitus without complications: Secondary | ICD-10-CM | POA: Diagnosis not present

## 2015-07-02 LAB — POCT INR: INR: 2.4

## 2015-07-13 DIAGNOSIS — E119 Type 2 diabetes mellitus without complications: Secondary | ICD-10-CM | POA: Diagnosis not present

## 2015-07-13 DIAGNOSIS — H40013 Open angle with borderline findings, low risk, bilateral: Secondary | ICD-10-CM | POA: Diagnosis not present

## 2015-07-13 DIAGNOSIS — H353131 Nonexudative age-related macular degeneration, bilateral, early dry stage: Secondary | ICD-10-CM | POA: Diagnosis not present

## 2015-07-13 DIAGNOSIS — Z961 Presence of intraocular lens: Secondary | ICD-10-CM | POA: Diagnosis not present

## 2015-08-06 ENCOUNTER — Ambulatory Visit: Payer: Medicare Other | Admitting: Cardiology

## 2015-08-06 ENCOUNTER — Ambulatory Visit (INDEPENDENT_AMBULATORY_CARE_PROVIDER_SITE_OTHER): Payer: Medicare Other | Admitting: *Deleted

## 2015-08-06 DIAGNOSIS — Z5181 Encounter for therapeutic drug level monitoring: Secondary | ICD-10-CM | POA: Diagnosis not present

## 2015-08-06 DIAGNOSIS — I4891 Unspecified atrial fibrillation: Secondary | ICD-10-CM

## 2015-08-06 DIAGNOSIS — I480 Paroxysmal atrial fibrillation: Secondary | ICD-10-CM

## 2015-08-06 LAB — POCT INR: INR: 2.1

## 2015-08-08 ENCOUNTER — Encounter: Payer: Self-pay | Admitting: Cardiology

## 2015-08-08 ENCOUNTER — Ambulatory Visit (INDEPENDENT_AMBULATORY_CARE_PROVIDER_SITE_OTHER): Payer: Medicare Other | Admitting: Cardiology

## 2015-08-08 VITALS — BP 120/60 | HR 84 | Ht 63.0 in | Wt 100.0 lb

## 2015-08-08 DIAGNOSIS — I442 Atrioventricular block, complete: Secondary | ICD-10-CM

## 2015-08-08 DIAGNOSIS — Z7901 Long term (current) use of anticoagulants: Secondary | ICD-10-CM

## 2015-08-08 DIAGNOSIS — Z950 Presence of cardiac pacemaker: Secondary | ICD-10-CM | POA: Diagnosis not present

## 2015-08-08 DIAGNOSIS — E7800 Pure hypercholesterolemia, unspecified: Secondary | ICD-10-CM | POA: Diagnosis not present

## 2015-08-08 DIAGNOSIS — I4819 Other persistent atrial fibrillation: Secondary | ICD-10-CM

## 2015-08-08 DIAGNOSIS — I481 Persistent atrial fibrillation: Secondary | ICD-10-CM

## 2015-08-08 DIAGNOSIS — J028 Acute pharyngitis due to other specified organisms: Secondary | ICD-10-CM | POA: Diagnosis not present

## 2015-08-08 LAB — CBC
HCT: 44.4 % (ref 36.0–46.0)
Hemoglobin: 14.5 g/dL (ref 12.0–15.0)
MCH: 31 pg (ref 26.0–34.0)
MCHC: 32.7 g/dL (ref 30.0–36.0)
MCV: 95.1 fL (ref 78.0–100.0)
MPV: 9 fL (ref 8.6–12.4)
Platelets: 190 10*3/uL (ref 150–400)
RBC: 4.67 MIL/uL (ref 3.87–5.11)
RDW: 13.3 % (ref 11.5–15.5)
WBC: 9.1 10*3/uL (ref 4.0–10.5)

## 2015-08-08 NOTE — Patient Instructions (Signed)
Your physician recommends that you continue on your current medications as directed. Please refer to the Current Medication list given to you today. Your physician recommends that you return for lab work in: today (CBC)  Your physician wants you to follow-up in: 6 months with Dr. Marlou Porch.  You will receive a reminder letter in the mail two months in advance. If you don't receive a letter, please call our office to schedule the follow-up appointment.

## 2015-08-08 NOTE — Progress Notes (Signed)
[place]. [address] [place], [place]  [address] Phone: [phone] Fax:  [phone]  Date:  08/08/2015   ID:  [name], DOB 10-Oct-1928, MRN [id number]  PCP:  Gara Kroner, MD   History of Present Illness: [name] is a 82 y.o. female with history of permanent atrial fibrillation, status post AV nodal ablation in 2010 for persistent symptoms. Diabetes, EF 45% likely from chronic pacing. Septal wall hypokinesis noted a nuclear stress test which actually showed an EF of 64%. Her pacemaker has been functioning normally. Underlying atrial fibrillation noted.  Had joint pain from Trajenta.  Still continues to smoke. Cough. Allergies. No fevers noted.  No fevers.  She did not like taking Eliquis. Would rather stay on Coumadin. This is fine. Her levels have been stable.  Her main complaint seems to be fatigued. She is not noticed any blood loss. She is on anticoagulation, Coumadin. No chest pain, no significant change in shortness of breath.   Wt Readings from Last 3 Encounters:  08/08/15 100 lb (45.36 kg)  02/12/15 98 lb 1.9 oz (44.507 kg)  08/16/14 104 lb (47.174 kg)     Past Medical History  Diagnosis Date  . Atrial fibrillation ([place]) 02/24/2013  . TIA (transient ischemic attack)   . Diabetes mellitus without complication ([place])   . Hypertension   . Depression   . Osteopenia     Past Surgical History  Procedure Laterality Date  . Cyst removal trunk      from breast  . Cateracts      eyes  . Pacemaker insertion  2008; 07/18/2013    MDT dual chamber pacemaker implanted 2008 by Dr Leonia Reeves; gen change 2015 by Dr Caryl Comes  . Ablation  2012    AVN ablation by Dr Lovena Le  . Permanent pacemaker generator change N/A 07/18/2013    Procedure: PERMANENT PACEMAKER GENERATOR CHANGE;  Surgeon: Deboraha Sprang, MD;  Location: [facility] CATH LAB;  Service: Cardiovascular;  Laterality: N/A;    Current Outpatient Prescriptions  Medication Sig Dispense Refill  . atorvastatin  (LIPITOR) 10 MG tablet Take 5 mg by mouth daily. 1/2 tablet daily    . Calcium Carbonate (CALCIUM 600 PO) Take 1 tablet by mouth 2 (two) times daily.    . Cholecalciferol (VITAMIN D3) 2000 UNITS TABS Take 1 tablet by mouth daily.    Marland Kitchen escitalopram (LEXAPRO) 20 MG tablet Take 20 mg by mouth daily.  0  . Eszopiclone 3 MG TABS Take 3 mg by mouth at bedtime. Take immediately before bedtime    . hydrochlorothiazide (MICROZIDE) 12.5 MG capsule Take 1 capsule (12.5 mg total) by mouth daily. 30 capsule 0  . LORazepam (ATIVAN) 1 MG tablet Take 1 mg by mouth at bedtime as needed for anxiety or sleep.     . Multiple Vitamin (MULTIVITAMIN) tablet Take 1 tablet by mouth daily.    . pregabalin (LYRICA) 50 MG capsule Take 1 capsule (50 mg total) by mouth daily. 90 capsule 1  . telmisartan (MICARDIS) 80 MG tablet Take 80 mg by mouth daily.    Marland Kitchen warfarin (COUMADIN) 1 MG tablet Take as directed BY COUMADIN CLINIC 60 tablet 3  . zolpidem (AMBIEN) 5 MG tablet Take 5 mg by mouth at bedtime as needed for sleep.     No current facility-administered medications for this visit.    Allergies:    Allergies  Allergen Reactions  . Eliquis [Apixaban] Other (See Comments)  Made pt have weak muscles  . Gabapentin     SHAKES AND NAUSEA   . Lyrica [Pregabalin]     DIZZY  . Rozerem [Ramelteon]     DIZZY AND HEAD THROBBING  . Temazepam     INSOMNIA  . Tikosyn [Dofetilide]     Mouth swells, itching  . Tradjenta [Linagliptin] Diarrhea    Joint Pain, Stomach issues    Social History:  The patient  reports that she has been smoking.  She has never used smokeless tobacco. She reports that she does not drink alcohol or use illicit drugs.   ROS:  Please see the history of present illness.   Denies any bleeding, syncope, new strokelike symptoms. No dizziness, no chest pain. Positive for cough, occasional depression, snoring, anxiety, leg pain, excessive fatigue.   PHYSICAL EXAM: VS:  BP 120/60 mmHg  Pulse 84  Ht  5\' 3"  (1.6 m)  Wt 100 lb (45.36 kg)  BMI 17.72 kg/m2 Thin, in no acute distress HEENT: normal Neck: no JVD Cardiac:  normal S1, S2; RRR; no murmur Lungs:  No significant wheezing, normal respiratory effort.  Abd: soft, nontender, no hepatomegaly Ext: no edema Skin: warm and dry Neuro: no focal abnormalities noted  EKG:  Atrial fibrillation rate 71, ventricular pacing.     Echocardiogram: 08/08/14-normal ejection fraction, there was an oscillating density on pacemaker lead.  ASSESSMENT AND PLAN:  1. Atrial fibrillation- CHADS-VASc 5. Off Eliquis back on coumadin (leg were rubbery). Off of her aspirin. AV nodal ablation. Because of fatigue, I will check a CBC and make sure she does not have any surreptitious blood loss. She does not recall having any noticeable blood loss. 2. Essential hypertension-blood pressure excellent. I asked her if she would like to come off of her hydrochlorothiazide 12.5 mg but she states that she needs this medication in order to keep her blood pressure under check. We will not make any adjustments. Her main complaint is fatigue. Encouraged her to continue with activity. 3. Pacemaker-reviewed office note on from Dr. Caryl Comes. Pacemaker battery update occurred on 07/18/13. 4. Protein calorie malnutrition-BMI 17. Encouraged protein, this will also help with fatigue. She is eating [employer]. 5. Tobacco use-encourage cessation. She does not wish to quit. 6. COPD/bronchitis-likely from extensive smoking history. Cough heard today. No wheezing heard today. Continue to work with Dr. Moreen Fowler, his PA saw her as well, helping her with allergies. 7. AV block/AV nodal ablation-permanent pacemaker required. Pacemaker dependent. Dr. Caryl Comes watching.  8. Six-month follow-up  Signed, Candee Furbish, MD [facility]  08/08/2015 3:20 PM

## 2015-08-09 ENCOUNTER — Ambulatory Visit (INDEPENDENT_AMBULATORY_CARE_PROVIDER_SITE_OTHER): Payer: Medicare Other | Admitting: Internal Medicine

## 2015-08-09 ENCOUNTER — Encounter: Payer: Self-pay | Admitting: Internal Medicine

## 2015-08-09 VITALS — BP 114/70 | HR 81 | Ht 62.5 in | Wt 101.2 lb

## 2015-08-09 DIAGNOSIS — I481 Persistent atrial fibrillation: Secondary | ICD-10-CM | POA: Diagnosis not present

## 2015-08-09 DIAGNOSIS — Z950 Presence of cardiac pacemaker: Secondary | ICD-10-CM

## 2015-08-09 DIAGNOSIS — I442 Atrioventricular block, complete: Secondary | ICD-10-CM

## 2015-08-09 DIAGNOSIS — I4819 Other persistent atrial fibrillation: Secondary | ICD-10-CM

## 2015-08-09 NOTE — Patient Instructions (Signed)
Medication Instructions:  Your physician has recommended you make the following change in your medication:  1) STOP Hydrochlorothiazide  Labwork: None ordered  Testing/Procedures: None ordered  Follow-Up: Remote monitoring is used to monitor your Pacemaker of ICD from home. This monitoring reduces the number of office visits required to check your device to one time per year. It allows [place] to keep an eye on the functioning of your device to ensure it is working properly. You are scheduled for a device check from home on 11/08/2015. You may send your transmission at any time that day. If you have a wireless device, the transmission will be sent automatically. After your physician reviews your transmission, you will receive a postcard with your next transmission date.  Your physician wants you to follow-up in: 1 year with Dr. Caryl Comes.  You will receive a reminder letter in the mail two months in advance. If you don't receive a letter, please call our office to schedule the follow-up appointment.  If you need a refill on your cardiac medications before your next appointment, please call your pharmacy.  Thank you for choosing [facility]!!

## 2015-08-09 NOTE — Progress Notes (Signed)
Patient Care Team: Antony Contras, MD as PCP - General (Family Medicine)   HPI  [name] is a 82 y.o. female Seen for pacemaker followup This had been undertaken for tachybradycardia syndrome. 2012 she underwent AV junction ablation by Dr. Lovena Le.  Thromboembolic risk profile includes prior TIA-2, hypertension-1, diabetes-1, gender-1, age-65 for a CHADS-VASc score of 7 Her blood pressure home is in the 110-20 range. She notes that after she takes her medicines later in the morning and early afternoon quite fatigued.   She continues to smoke.   Past Medical History  Diagnosis Date  . Atrial fibrillation ([place]) 02/24/2013  . TIA (transient ischemic attack)   . Diabetes mellitus without complication ([place])   . Hypertension   . Depression   . Osteopenia     Past Surgical History  Procedure Laterality Date  . Cyst removal trunk      from breast  . Cateracts      eyes  . Pacemaker insertion  2008; 07/18/2013    MDT dual chamber pacemaker implanted 2008 by Dr Leonia Reeves; gen change 2015 by Dr Caryl Comes  . Ablation  2012    AVN ablation by Dr Lovena Le  . Permanent pacemaker generator change N/A 07/18/2013    Procedure: PERMANENT PACEMAKER GENERATOR CHANGE;  Surgeon: Deboraha Sprang, MD;  Location: [facility] CATH LAB;  Service: Cardiovascular;  Laterality: N/A;    Current Outpatient Prescriptions  Medication Sig Dispense Refill  . atorvastatin (LIPITOR) 10 MG tablet Take 5 mg by mouth daily. 1/2 tablet daily    . Calcium Carbonate (CALCIUM 600 PO) Take 1 tablet by mouth 2 (two) times daily.    . Cholecalciferol (VITAMIN D3) 2000 UNITS TABS Take 1 tablet by mouth daily.    Marland Kitchen escitalopram (LEXAPRO) 20 MG tablet Take 20 mg by mouth daily.  0  . Eszopiclone 3 MG TABS Take 3 mg by mouth at bedtime. Take immediately before bedtime    . hydrochlorothiazide (MICROZIDE) 12.5 MG capsule Take 1 capsule (12.5 mg total) by mouth daily. 30 capsule 0  . LORazepam (ATIVAN) 1 MG tablet Take 1 mg by mouth  at bedtime as needed for anxiety or sleep.     . Multiple Vitamin (MULTIVITAMIN) tablet Take 1 tablet by mouth daily.    . pregabalin (LYRICA) 50 MG capsule Take 1 capsule (50 mg total) by mouth daily. 90 capsule 1  . telmisartan (MICARDIS) 80 MG tablet Take 80 mg by mouth daily.    Marland Kitchen warfarin (COUMADIN) 1 MG tablet Take as directed BY COUMADIN CLINIC 60 tablet 3  . zolpidem (AMBIEN) 5 MG tablet Take 5 mg by mouth at bedtime as needed for sleep.     No current facility-administered medications for this visit.    Allergies  Allergen Reactions  . Eliquis [Apixaban] Other (See Comments)    Made pt have weak muscles  . Gabapentin     SHAKES AND NAUSEA   . Lyrica [Pregabalin]     DIZZY  . Rozerem [Ramelteon]     DIZZY AND HEAD THROBBING  . Temazepam     INSOMNIA  . Tikosyn [Dofetilide]     Mouth swells, itching  . Tradjenta [Linagliptin] Diarrhea    Joint Pain, Stomach issues    Review of Systems negative except from HPI and PMH  Physical Exam BP 114/70 mmHg  Pulse 81  Ht 5' 2.5" (1.588 m)  Wt 101 lb 3.2 oz (45.904 kg)  BMI 18.20 kg/m2 Well developed and  cachectic  in no acute distress HENT normal E scleral and icterus clear Neck Supple JVP flat; carotids brisk and full Clear to ausculation  Regular rate and rhythm, no murmurs gallops or rub Soft with active bowel sounds No clubbing cyanosis none Edema Alert and oriented, grossly normal motor and sensory function Skin Warm and Dry    Assessment and  Plan  Atrial fibrillation  Complete heart block s/p AV ablation  Hypertension   Pacemaker Medtronic  The patient's device was interrogated.  The information was reviewed. No changes were made in the programming.    Stable rhythm issues  We will have her take her Micardis at night and we'll have her discontinue hydrochlorothiazide altogether. Her blood pressures are well controlled and appears perhaps some of her midday fatigue is related to hypotension related to  her medications.

## 2015-08-10 LAB — CUP PACEART INCLINIC DEVICE CHECK
Battery Impedance: 136 Ohm
Battery Remaining Longevity: 138 mo
Battery Voltage: 2.79 V
Brady Statistic RV Percent Paced: 100 %
Date Time Interrogation Session: 20170323183028
Implantable Lead Implant Date: 20080416
Implantable Lead Implant Date: 20080416
Implantable Lead Location: 753859
Implantable Lead Location: 753860
Implantable Lead Model: 4076
Implantable Lead Model: 5076
Lead Channel Impedance Value: 533 Ohm
Lead Channel Impedance Value: 67 Ohm
Lead Channel Pacing Threshold Amplitude: 0.5 V
Lead Channel Pacing Threshold Amplitude: 0.5 V
Lead Channel Pacing Threshold Pulse Width: 0.4 ms
Lead Channel Pacing Threshold Pulse Width: 0.4 ms
Lead Channel Setting Pacing Amplitude: 2.5 V
Lead Channel Setting Pacing Pulse Width: 0.4 ms
Lead Channel Setting Sensing Sensitivity: 5.6 mV

## 2015-08-16 DIAGNOSIS — F411 Generalized anxiety disorder: Secondary | ICD-10-CM | POA: Diagnosis not present

## 2015-08-16 DIAGNOSIS — I129 Hypertensive chronic kidney disease with stage 1 through stage 4 chronic kidney disease, or unspecified chronic kidney disease: Secondary | ICD-10-CM | POA: Diagnosis not present

## 2015-08-16 DIAGNOSIS — M791 Myalgia: Secondary | ICD-10-CM | POA: Diagnosis not present

## 2015-08-16 DIAGNOSIS — Z720 Tobacco use: Secondary | ICD-10-CM | POA: Diagnosis not present

## 2015-08-16 DIAGNOSIS — R531 Weakness: Secondary | ICD-10-CM | POA: Diagnosis not present

## 2015-08-20 ENCOUNTER — Emergency Department (HOSPITAL_COMMUNITY): Payer: Medicare Other

## 2015-08-20 ENCOUNTER — Encounter (HOSPITAL_COMMUNITY): Payer: Self-pay | Admitting: *Deleted

## 2015-08-20 ENCOUNTER — Emergency Department (HOSPITAL_COMMUNITY)
Admission: EM | Admit: 2015-08-20 | Discharge: 2015-08-20 | Disposition: A | Discharge status: Home / Self Care | Payer: Medicare Other | Attending: Emergency Medicine | Admitting: Emergency Medicine

## 2015-08-20 DIAGNOSIS — F17200 Nicotine dependence, unspecified, uncomplicated: Secondary | ICD-10-CM | POA: Insufficient documentation

## 2015-08-20 DIAGNOSIS — Z79899 Other long term (current) drug therapy: Secondary | ICD-10-CM | POA: Diagnosis not present

## 2015-08-20 DIAGNOSIS — E119 Type 2 diabetes mellitus without complications: Secondary | ICD-10-CM | POA: Diagnosis not present

## 2015-08-20 DIAGNOSIS — R5383 Other fatigue: Secondary | ICD-10-CM | POA: Diagnosis not present

## 2015-08-20 DIAGNOSIS — R531 Weakness: Secondary | ICD-10-CM | POA: Diagnosis not present

## 2015-08-20 DIAGNOSIS — Z8673 Personal history of transient ischemic attack (TIA), and cerebral infarction without residual deficits: Secondary | ICD-10-CM | POA: Diagnosis not present

## 2015-08-20 DIAGNOSIS — I10 Essential (primary) hypertension: Secondary | ICD-10-CM | POA: Diagnosis not present

## 2015-08-20 DIAGNOSIS — R42 Dizziness and giddiness: Secondary | ICD-10-CM | POA: Diagnosis present

## 2015-08-20 LAB — CBC
HCT: 43.7 % (ref 36.0–46.0)
Hemoglobin: 14.7 g/dL (ref 12.0–15.0)
MCH: 32.2 pg (ref 26.0–34.0)
MCHC: 33.6 g/dL (ref 30.0–36.0)
MCV: 95.8 fL (ref 78.0–100.0)
Platelets: 178 10*3/uL (ref 150–400)
RBC: 4.56 MIL/uL (ref 3.87–5.11)
RDW: 13.3 % (ref 11.5–15.5)
WBC: 7.9 10*3/uL (ref 4.0–10.5)

## 2015-08-20 LAB — URINALYSIS, ROUTINE W REFLEX MICROSCOPIC
Bilirubin Urine: NEGATIVE
Glucose, UA: NEGATIVE mg/dL
Hgb urine dipstick: NEGATIVE
Ketones, ur: NEGATIVE mg/dL
Leukocytes, UA: NEGATIVE
Nitrite: NEGATIVE
Protein, ur: NEGATIVE mg/dL
Specific Gravity, Urine: 1.01 (ref 1.005–1.030)
pH: 6.5 (ref 5.0–8.0)

## 2015-08-20 LAB — BASIC METABOLIC PANEL
Anion gap: 11 (ref 5–15)
BUN: 23 mg/dL — ABNORMAL HIGH (ref 6–20)
CO2: 28 mmol/L (ref 22–32)
Calcium: 9.8 mg/dL (ref 8.9–10.3)
Chloride: 100 mmol/L — ABNORMAL LOW (ref 101–111)
Creatinine, Ser: 1.28 mg/dL — ABNORMAL HIGH (ref 0.44–1.00)
GFR calc Af Amer: 43 mL/min — ABNORMAL LOW (ref >60)
GFR calc non Af Amer: 37 mL/min — ABNORMAL LOW (ref >60)
Glucose, Bld: 208 mg/dL — ABNORMAL HIGH (ref 65–99)
Potassium: 4.6 mmol/L (ref 3.5–5.1)
Sodium: 139 mmol/L (ref 135–145)

## 2015-08-20 LAB — TROPONIN I: Troponin I: <0.03 ng/mL (ref <0.031)

## 2015-08-20 LAB — PROTIME-INR
INR: 2.49 — ABNORMAL HIGH (ref 0.00–1.49)
Prothrombin Time: 26.6 seconds — ABNORMAL HIGH (ref 11.6–15.2)

## 2015-08-20 NOTE — ED Provider Notes (Signed)
CSN: [id number]     Arrival date & time 08/20/15  0920 History   First MD Initiated Contact with Patient 08/20/15 1052     Chief Complaint  Patient presents with  . Dizziness  . Weakness     (Consider location/radiation/quality/duration/timing/severity/associated sxs/prior Treatment) HPI Comments: 82 year old female with past medical history including atrial fibrillation on Coumadin, pacemaker, TIA, type 2 diabetes mellitus who presents with weakness. The patient states that for the past 4 days, she has had generalized weakness and fatigue. She reports dizziness which she describes as a lightheadedness, feeling like she is going to pass out but no episodes of syncope. She states she feels like her legs are weak but she has been able to walk. She does have some numbness in her legs and a history of diabetic neuropathy. She endorses a mild headache but denies any sudden onset of severe headache. No visual changes. No chest pain, shortness of breath, abdominal pain, fevers, or cough/cold symptoms. No urinary symptoms. She had one episode of vomiting several days ago but no vomiting since then and no diarrhea.  Patient is a 82 y.o. female presenting with dizziness and weakness. The history is provided by the patient.  Dizziness Associated symptoms: weakness   Weakness    Past Medical History  Diagnosis Date  . Atrial fibrillation ([place]) 02/24/2013  . TIA (transient ischemic attack)   . Diabetes mellitus without complication ([place])   . Hypertension   . Depression   . Osteopenia    Past Surgical History  Procedure Laterality Date  . Cyst removal trunk      from breast  . Cateracts      eyes  . Pacemaker insertion  2008; 07/18/2013    MDT dual chamber pacemaker implanted 2008 by Dr Leonia Reeves; gen change 2015 by Dr Caryl Comes  . Ablation  2012    AVN ablation by Dr Lovena Le  . Permanent pacemaker generator change N/A 07/18/2013    Procedure: PERMANENT PACEMAKER GENERATOR CHANGE;  Surgeon: Deboraha Sprang, MD;  Location[facility] CATH LAB;  Service: Cardiovascular;  Laterality: N/A;   Family History  Problem Relation Age of Onset  . Stroke Father    Social History  Substance Use Topics  . Smoking status: Current Every Day Smoker  . Smokeless tobacco: Never Used  . Alcohol Use: No   OB History    No data available     Review of Systems  Neurological: Positive for dizziness and weakness.   10 Systems reviewed and are negative for acute change except as noted in the HPI.   Allergies  Eliquis; Gabapentin; Rozerem; Temazepam; Tikosyn; and Tradjenta  Home Medications   Prior to Admission medications   Medication Sig Start Date End Date Taking? Authorizing Provider  escitalopram (LEXAPRO) 20 MG tablet Take 20 mg by mouth daily. 02/10/15  Yes Historical Provider, MD  LORazepam (ATIVAN) 1 MG tablet Take 1 mg by mouth at bedtime as needed for anxiety or sleep.    Yes Historical Provider, MD  Multiple Vitamin (MULTIVITAMIN) tablet Take 1 tablet by mouth daily.   Yes Historical Provider, MD  pregabalin (LYRICA) 50 MG capsule Take 1 capsule (50 mg total) by mouth daily. 03/06/15  Yes Max T Hyatt, DPM  telmisartan (MICARDIS) 80 MG tablet Take 80 mg by mouth daily.   Yes Historical Provider, MD  warfarin (COUMADIN) 1 MG tablet Take as directed BY COUMADIN CLINIC Patient taking differently: Take 1-2 mg by mouth daily at 6 PM. Take 2 tablets  all days except on Friday take 1.Take as directed BY COUMADIN CLINIC 04/16/15  Yes [name], MD  zolpidem (AMBIEN) 5 MG tablet Take 5 mg by mouth at bedtime as needed for sleep.   Yes Historical Provider, MD   BP 156/79 mmHg  Pulse 60  Temp(Src) 98.6 F (37 C) (Oral)  Resp 21  SpO2 96% Physical Exam  Constitutional: She is oriented to person, place, and time. She appears well-developed and well-nourished. No distress.  HENT:  Head: Normocephalic and atraumatic.  Moist mucous membranes  Eyes: Conjunctivae and EOM are normal. Pupils are equal,  round, and reactive to light.  Neck: Neck supple.  Cardiovascular: Normal rate, regular rhythm and normal heart sounds.   No murmur heard. Pulmonary/Chest: Effort normal and breath sounds normal.  Abdominal: Soft. Bowel sounds are normal. She exhibits no distension. There is no tenderness.  Musculoskeletal: She exhibits no edema.  Neurological: She is alert and oriented to person, place, and time. No cranial nerve deficit. She exhibits normal muscle tone.  Fluent speech, 5/5 strength x all 4 ext  Skin: Skin is warm and dry.  Psychiatric: She has a normal mood and affect. Judgment normal.  Nursing note and vitals reviewed.   ED Course  Procedures (including critical care time) Labs Review Labs Reviewed  BASIC METABOLIC PANEL - Abnormal; Notable for the following:    Chloride 100 (*)    Glucose, Bld 208 (*)    BUN 23 (*)    Creatinine, Ser 1.28 (*)    GFR calc non Af Amer 37 (*)    GFR calc Af Amer 43 (*)    All other components within normal limits  PROTIME-INR - Abnormal; Notable for the following:    Prothrombin Time 26.6 (*)    INR 2.49 (*)    All other components within normal limits  CBC  URINALYSIS, ROUTINE W REFLEX MICROSCOPIC (NOT AT [facility])  TROPONIN I    Imaging Review Dg Chest 2 View  08/20/2015  CLINICAL DATA:  82 year old female with weakness. Hypertension, atrial fibrillation diabetes. Initial encounter. EXAM: CHEST  2 VIEW COMPARISON:  09/17/2014 and 11/02/2013. FINDINGS: Sequential pacemaker enters from the left with leads unchanged in position. Heart size within normal limits. Central pulmonary vascular prominence without pulmonary edema. Chronic increased lung markings without segmental infiltrate or pneumothorax. No plain film evidence of pulmonary malignancy. Mild curvature of the thoracic spine. Calcified aorta. IMPRESSION: No acute abnormality noted.  Please see above. Electronically Signed   By: Genia Del M.D.   On: 08/20/2015 12:03   I have personally  reviewed and evaluated these lab results as part of my medical decision-making.   EKG Interpretation   Date/Time:  Monday August 20 2015 09:35:01 EDT Ventricular Rate:  65 PR Interval:    QRS Duration: 140 QT Interval:  468 QTC Calculation: 486 R Axis:   92 Text Interpretation:  Ventricular-paced rhythm Abnormal ECG No significant  change since last tracing Confirmed by Zarea Diesing MD, Maryclaire Stoecker [id number]) on  08/20/2015 10:53:40 AM      MDM   Final diagnoses:  Other fatigue   Pt p/w several days of generalized weakness and lightheadedness without any other associated sx. she was well-appearing on exam with reassuring vital signs. Normal work of breathing, no abdominal tenderness. Normal neurologic exam. EKG shows ventricularly paced rhythm, no significant changes from previous. Obtained above lab work which shows creatinine similar to previous at 1.28. Blood counts are normal. Interrogated pacemaker and all within normal limits,  no events recorded. UA w/o evidence of infection, CXR negative acute. On reexamination, the patient is well-appearing with reassuring vital signs. She has been able to ambulate around the room without problems. I emphasized the importance of following up with her PCP this week for reevaluation and further workup. I reviewed return precautions including chest pain, shortness of breath, or any new symptoms. Patient voiced understanding and was discharged in satisfactory condition.    Sharlett Iles, MD 08/20/15 [phone]

## 2015-08-20 NOTE — Discharge Instructions (Signed)
Fatigue  Fatigue is feeling tired all of the time, a lack of energy, or a lack of motivation. Occasional or mild fatigue is often a normal response to activity or life in general. However, long-lasting (chronic) or extreme fatigue may indicate an underlying medical condition.  HOME CARE INSTRUCTIONS   Watch your fatigue for any changes. The following actions may help to lessen any discomfort you are feeling:  · Talk to your health care provider about how much sleep you need each night. Try to get the required amount every night.  · Take medicines only as directed by your health care provider.  · Eat a healthy and nutritious diet. Ask your health care provider if you need help changing your diet.  · Drink enough fluid to keep your urine clear or pale yellow.  · Practice ways of relaxing, such as yoga, meditation, massage therapy, or acupuncture.  · Exercise regularly.    · Change situations that cause you stress. Try to keep your work and personal routine reasonable.  · Do not abuse illegal drugs.  · Limit alcohol intake to no more than 1 drink per day for nonpregnant women and 2 drinks per day for men. One drink equals 12 ounces of beer, 5 ounces of wine, or 1½ ounces of hard liquor.  · Take a multivitamin, if directed by your health care provider.  SEEK MEDICAL CARE IF:   · Your fatigue does not get better.  · You have a fever.    · You have unintentional weight loss or gain.  · You have headaches.    · You have difficulty:      Falling asleep.    Sleeping throughout the night.  · You feel angry, guilty, anxious, or sad.     · You are unable to have a bowel movement (constipation).    · You skin is dry.     · Your legs or another part of your body is swollen.    SEEK IMMEDIATE MEDICAL CARE IF:   · You feel confused.    · Your vision is blurry.  · You feel faint or pass out.    · You have a severe headache.    · You have severe abdominal, pelvic, or back pain.    · You have chest pain, shortness of breath, or an  irregular or fast heartbeat.    · You are unable to urinate or you urinate less than normal.    · You develop abnormal bleeding, such as bleeding from the rectum, vagina, nose, lungs, or nipples.  · You vomit blood.     · You have thoughts about harming yourself or committing suicide.    · You are worried that you might harm someone else.       This information is not intended to replace advice given to you by your health care provider. Make sure you discuss any questions you have with your health care provider.     Document Released: 03/02/2007 Document Revised: 05/26/2014 Document Reviewed: 09/06/2013  Elsevier Interactive Patient Education ©2016 [employer]

## 2015-08-20 NOTE — ED Notes (Signed)
Meal tray ordered 

## 2015-08-20 NOTE — ED Notes (Signed)
Patient ambulated around room, tolerated well. Pt reported feeling somewhat off balance but appeared steady on her feet.

## 2015-08-20 NOTE — ED Notes (Signed)
Lilly from [facility] called stating that patient's medtronic pacemaker reading was WDL and she would fax full report over.

## 2015-08-20 NOTE — ED Notes (Signed)
Pt ambulated to room without assistance, appeared steady on her feet.

## 2015-08-20 NOTE — ED Notes (Signed)
MD at bedside. 

## 2015-08-20 NOTE — ED Notes (Signed)
Pt reports generalized fatigue and weakness since Thursday. Difficulty walking due to bilateral leg weakness. No acute distress noted at triage.

## 2015-08-28 ENCOUNTER — Other Ambulatory Visit: Payer: Self-pay | Admitting: *Deleted

## 2015-08-28 MED ORDER — WARFARIN SODIUM 1 MG PO TABS: ORAL_TABLET | ORAL | Status: DC

## 2015-08-28 NOTE — Telephone Encounter (Signed)
Refill done as requested 

## 2015-09-11 DIAGNOSIS — I4891 Unspecified atrial fibrillation: Secondary | ICD-10-CM | POA: Diagnosis not present

## 2015-09-11 DIAGNOSIS — E1149 Type 2 diabetes mellitus with other diabetic neurological complication: Secondary | ICD-10-CM | POA: Diagnosis not present

## 2015-09-11 DIAGNOSIS — E782 Mixed hyperlipidemia: Secondary | ICD-10-CM | POA: Diagnosis not present

## 2015-09-11 DIAGNOSIS — N183 Chronic kidney disease, stage 3 (moderate): Secondary | ICD-10-CM | POA: Diagnosis not present

## 2015-09-11 DIAGNOSIS — I129 Hypertensive chronic kidney disease with stage 1 through stage 4 chronic kidney disease, or unspecified chronic kidney disease: Secondary | ICD-10-CM | POA: Diagnosis not present

## 2015-09-11 DIAGNOSIS — Z950 Presence of cardiac pacemaker: Secondary | ICD-10-CM | POA: Diagnosis not present

## 2015-09-11 DIAGNOSIS — F3340 Major depressive disorder, recurrent, in remission, unspecified: Secondary | ICD-10-CM | POA: Diagnosis not present

## 2015-09-11 DIAGNOSIS — G4700 Insomnia, unspecified: Secondary | ICD-10-CM | POA: Diagnosis not present

## 2015-09-11 DIAGNOSIS — G629 Polyneuropathy, unspecified: Secondary | ICD-10-CM | POA: Diagnosis not present

## 2015-09-11 DIAGNOSIS — F411 Generalized anxiety disorder: Secondary | ICD-10-CM | POA: Diagnosis not present

## 2015-09-17 ENCOUNTER — Ambulatory Visit (INDEPENDENT_AMBULATORY_CARE_PROVIDER_SITE_OTHER): Payer: Medicare Other | Admitting: *Deleted

## 2015-09-17 DIAGNOSIS — I4891 Unspecified atrial fibrillation: Secondary | ICD-10-CM

## 2015-09-17 DIAGNOSIS — I481 Persistent atrial fibrillation: Secondary | ICD-10-CM

## 2015-09-17 DIAGNOSIS — I4819 Other persistent atrial fibrillation: Secondary | ICD-10-CM

## 2015-09-17 DIAGNOSIS — Z5181 Encounter for therapeutic drug level monitoring: Secondary | ICD-10-CM | POA: Diagnosis not present

## 2015-09-17 LAB — POCT INR: INR: 2.3

## 2015-09-18 ENCOUNTER — Encounter: Payer: Self-pay | Admitting: Podiatry

## 2015-09-18 ENCOUNTER — Ambulatory Visit (INDEPENDENT_AMBULATORY_CARE_PROVIDER_SITE_OTHER): Payer: Medicare Other | Admitting: Podiatry

## 2015-09-18 DIAGNOSIS — M79606 Pain in leg, unspecified: Secondary | ICD-10-CM

## 2015-09-18 DIAGNOSIS — M79673 Pain in unspecified foot: Secondary | ICD-10-CM

## 2015-09-18 DIAGNOSIS — B351 Tinea unguium: Secondary | ICD-10-CM

## 2015-09-18 DIAGNOSIS — E1142 Type 2 diabetes mellitus with diabetic polyneuropathy: Secondary | ICD-10-CM | POA: Diagnosis not present

## 2015-09-18 MED ORDER — PREGABALIN 50 MG PO CAPS: 50.0000 mg | ORAL_CAPSULE | Freq: Every day | ORAL | Status: DC

## 2015-09-18 NOTE — Progress Notes (Signed)
Patient ID: [name], female   DOB: Apr 17, 1929, 82 y.o.   MRN: [id number] Complaint:  Visit Type: Patient returns to my office for continued preventative foot care services. Complaint: Patient states" my nails have grown long and thick and become painful to walk and wear shoes" Patient has been diagnosed with DM with no complications. He presents for preventative foot care services. No changes to ROS  Podiatric Exam: Vascular: dorsalis pedis and posterior tibial pulses are palpable bilateral. Capillary return is immediate. Temperature gradient is WNL. Skin turgor WNL  Sensorium: Normal Semmes Weinstein monofilament test. Normal tactile sensation bilaterally. Nail Exam: Pt has thick disfigured discolored nails with subungual debris noted bilateral entire nail hallux through fifth toenails Ulcer Exam: There is no evidence of ulcer or pre-ulcerative changes or infection. Orthopedic Exam: Muscle tone and strength are WNL. No limitations in general ROM. No crepitus or effusions noted. Foot type and digits show no abnormalities. Bony prominences are unremarkable. Skin: No Porokeratosis. No infection or ulcers  Diagnosis:  Tinea unguium, Pain in right toe, pain in left toes  Treatment & Plan Procedures and Treatment: Consent by patient was obtained for treatment procedures. The patient understood the discussion of treatment and procedures well. All questions were answered thoroughly reviewed. Debridement of mycotic and hypertrophic toenails, 1 through 5 bilateral and clearing of subungual debris. No ulceration, no infection noted.  Patient to buy Formula 3.  Refill Lyrica 50 mg.  One at hs. Return Visit-Office Procedure: Patient instructed to return to the office for a follow up visit 3 months for continued evaluation and treatment.    [name] DPM

## 2015-09-27 DIAGNOSIS — E119 Type 2 diabetes mellitus without complications: Secondary | ICD-10-CM | POA: Diagnosis not present

## 2015-10-29 ENCOUNTER — Ambulatory Visit (INDEPENDENT_AMBULATORY_CARE_PROVIDER_SITE_OTHER): Payer: Medicare Other

## 2015-10-29 DIAGNOSIS — I481 Persistent atrial fibrillation: Secondary | ICD-10-CM

## 2015-10-29 DIAGNOSIS — Z5181 Encounter for therapeutic drug level monitoring: Secondary | ICD-10-CM

## 2015-10-29 DIAGNOSIS — I4891 Unspecified atrial fibrillation: Secondary | ICD-10-CM | POA: Diagnosis not present

## 2015-10-29 DIAGNOSIS — I4819 Other persistent atrial fibrillation: Secondary | ICD-10-CM

## 2015-10-29 LAB — POCT INR: INR: 2.4

## 2015-11-08 ENCOUNTER — Ambulatory Visit (INDEPENDENT_AMBULATORY_CARE_PROVIDER_SITE_OTHER): Payer: Medicare Other | Admitting: *Deleted

## 2015-11-08 DIAGNOSIS — I442 Atrioventricular block, complete: Secondary | ICD-10-CM | POA: Diagnosis not present

## 2015-11-08 NOTE — Progress Notes (Signed)
Remote pacemaker transmission.   

## 2015-11-10 LAB — CUP PACEART REMOTE DEVICE CHECK
Battery Impedance: 136 Ohm
Battery Remaining Longevity: 134 mo
Battery Voltage: 2.78 V
Brady Statistic RV Percent Paced: 100 %
Date Time Interrogation Session: 20170622113534
Implantable Lead Implant Date: 20080416
Implantable Lead Implant Date: 20080416
Implantable Lead Location: 753859
Implantable Lead Location: 753860
Implantable Lead Model: 4076
Implantable Lead Model: 5076
Lead Channel Impedance Value: 492 Ohm
Lead Channel Impedance Value: 67 Ohm
Lead Channel Pacing Threshold Amplitude: 0.625 V
Lead Channel Pacing Threshold Pulse Width: 0.4 ms
Lead Channel Setting Pacing Amplitude: 2.5 V
Lead Channel Setting Pacing Pulse Width: 0.4 ms
Lead Channel Setting Sensing Sensitivity: 5.6 mV

## 2015-12-10 ENCOUNTER — Ambulatory Visit (INDEPENDENT_AMBULATORY_CARE_PROVIDER_SITE_OTHER): Payer: Medicare Other | Admitting: Pharmacist

## 2015-12-10 DIAGNOSIS — Z5181 Encounter for therapeutic drug level monitoring: Secondary | ICD-10-CM

## 2015-12-10 DIAGNOSIS — I4891 Unspecified atrial fibrillation: Secondary | ICD-10-CM

## 2015-12-10 LAB — POCT INR: INR: 2

## 2015-12-13 DIAGNOSIS — F411 Generalized anxiety disorder: Secondary | ICD-10-CM | POA: Diagnosis not present

## 2015-12-13 DIAGNOSIS — F3340 Major depressive disorder, recurrent, in remission, unspecified: Secondary | ICD-10-CM | POA: Diagnosis not present

## 2015-12-13 DIAGNOSIS — G4700 Insomnia, unspecified: Secondary | ICD-10-CM | POA: Diagnosis not present

## 2015-12-13 DIAGNOSIS — E782 Mixed hyperlipidemia: Secondary | ICD-10-CM | POA: Diagnosis not present

## 2015-12-13 DIAGNOSIS — E1149 Type 2 diabetes mellitus with other diabetic neurological complication: Secondary | ICD-10-CM | POA: Diagnosis not present

## 2015-12-13 DIAGNOSIS — I4891 Unspecified atrial fibrillation: Secondary | ICD-10-CM | POA: Diagnosis not present

## 2015-12-13 DIAGNOSIS — N183 Chronic kidney disease, stage 3 (moderate): Secondary | ICD-10-CM | POA: Diagnosis not present

## 2015-12-13 DIAGNOSIS — I129 Hypertensive chronic kidney disease with stage 1 through stage 4 chronic kidney disease, or unspecified chronic kidney disease: Secondary | ICD-10-CM | POA: Diagnosis not present

## 2015-12-13 DIAGNOSIS — G629 Polyneuropathy, unspecified: Secondary | ICD-10-CM | POA: Diagnosis not present

## 2015-12-13 DIAGNOSIS — Z950 Presence of cardiac pacemaker: Secondary | ICD-10-CM | POA: Diagnosis not present

## 2015-12-25 ENCOUNTER — Ambulatory Visit (INDEPENDENT_AMBULATORY_CARE_PROVIDER_SITE_OTHER): Payer: Medicare Other | Admitting: Podiatry

## 2015-12-25 ENCOUNTER — Encounter: Payer: Self-pay | Admitting: Podiatry

## 2015-12-25 DIAGNOSIS — M79673 Pain in unspecified foot: Secondary | ICD-10-CM | POA: Diagnosis not present

## 2015-12-25 DIAGNOSIS — E1142 Type 2 diabetes mellitus with diabetic polyneuropathy: Secondary | ICD-10-CM | POA: Diagnosis not present

## 2015-12-25 DIAGNOSIS — B351 Tinea unguium: Secondary | ICD-10-CM | POA: Diagnosis not present

## 2015-12-25 NOTE — Progress Notes (Signed)
Patient ID: [name], female   DOB: January 15, 1929, 82 y.o.   MRN: [id number] Complaint:  Visit Type: Patient returns to my office for continued preventative foot care services. Complaint: Patient states" my nails have grown long and thick and become painful to walk and wear shoes" Patient has been diagnosed with DM with no complications. He presents for preventative foot care services. No changes to ROS  Podiatric Exam: Vascular: dorsalis pedis and posterior tibial pulses are palpable bilateral. Capillary return is immediate. Temperature gradient is WNL. Skin turgor WNL  Sensorium: Normal Semmes Weinstein monofilament test. Normal tactile sensation bilaterally. Nail Exam: Pt has thick disfigured discolored nails with subungual debris noted bilateral entire nail hallux through fifth toenails Ulcer Exam: There is no evidence of ulcer or pre-ulcerative changes or infection. Orthopedic Exam: Muscle tone and strength are WNL. No limitations in general ROM. No crepitus or effusions noted. Foot type and digits show no abnormalities. Bony prominences are unremarkable. Skin: No Porokeratosis. No infection or ulcers  Diagnosis:  Tinea unguium, Pain in right toe, pain in left toes  Treatment & Plan Procedures and Treatment: Consent by patient was obtained for treatment procedures. The patient understood the discussion of treatment and procedures well. All questions were answered thoroughly reviewed. Debridement of mycotic and hypertrophic toenails, 1 through 5 bilateral and clearing of subungual debris. No ulceration, no infection noted.  Patient to buy Formula 3.  Refill Lyrica 50 mg.  One at hs. Return Visit-Office Procedure: Patient instructed to return to the office for a follow up visit 3 months for continued evaluation and treatment.    [name] DPM

## 2015-12-28 DIAGNOSIS — E119 Type 2 diabetes mellitus without complications: Secondary | ICD-10-CM | POA: Diagnosis not present

## 2016-01-07 ENCOUNTER — Other Ambulatory Visit: Payer: Self-pay | Admitting: Cardiology

## 2016-01-11 DIAGNOSIS — G4700 Insomnia, unspecified: Secondary | ICD-10-CM | POA: Diagnosis not present

## 2016-01-11 DIAGNOSIS — G629 Polyneuropathy, unspecified: Secondary | ICD-10-CM | POA: Diagnosis not present

## 2016-01-11 DIAGNOSIS — E1149 Type 2 diabetes mellitus with other diabetic neurological complication: Secondary | ICD-10-CM | POA: Diagnosis not present

## 2016-01-11 DIAGNOSIS — S0012XA Contusion of left eyelid and periocular area, initial encounter: Secondary | ICD-10-CM | POA: Diagnosis not present

## 2016-01-11 DIAGNOSIS — F411 Generalized anxiety disorder: Secondary | ICD-10-CM | POA: Diagnosis not present

## 2016-01-11 DIAGNOSIS — F3340 Major depressive disorder, recurrent, in remission, unspecified: Secondary | ICD-10-CM | POA: Diagnosis not present

## 2016-01-11 DIAGNOSIS — I129 Hypertensive chronic kidney disease with stage 1 through stage 4 chronic kidney disease, or unspecified chronic kidney disease: Secondary | ICD-10-CM | POA: Diagnosis not present

## 2016-01-11 DIAGNOSIS — I4891 Unspecified atrial fibrillation: Secondary | ICD-10-CM | POA: Diagnosis not present

## 2016-01-11 DIAGNOSIS — E782 Mixed hyperlipidemia: Secondary | ICD-10-CM | POA: Diagnosis not present

## 2016-01-13 DIAGNOSIS — E1149 Type 2 diabetes mellitus with other diabetic neurological complication: Secondary | ICD-10-CM | POA: Diagnosis not present

## 2016-01-17 DIAGNOSIS — E119 Type 2 diabetes mellitus without complications: Secondary | ICD-10-CM | POA: Diagnosis not present

## 2016-01-18 DIAGNOSIS — F411 Generalized anxiety disorder: Secondary | ICD-10-CM | POA: Diagnosis not present

## 2016-01-18 DIAGNOSIS — R630 Anorexia: Secondary | ICD-10-CM | POA: Diagnosis not present

## 2016-01-18 DIAGNOSIS — I129 Hypertensive chronic kidney disease with stage 1 through stage 4 chronic kidney disease, or unspecified chronic kidney disease: Secondary | ICD-10-CM | POA: Diagnosis not present

## 2016-01-18 DIAGNOSIS — E1149 Type 2 diabetes mellitus with other diabetic neurological complication: Secondary | ICD-10-CM | POA: Diagnosis not present

## 2016-01-19 DIAGNOSIS — E119 Type 2 diabetes mellitus without complications: Secondary | ICD-10-CM | POA: Diagnosis not present

## 2016-01-22 ENCOUNTER — Emergency Department (HOSPITAL_COMMUNITY): Payer: Medicare Other

## 2016-01-22 ENCOUNTER — Encounter (HOSPITAL_COMMUNITY): Payer: Self-pay | Admitting: *Deleted

## 2016-01-22 ENCOUNTER — Ambulatory Visit (INDEPENDENT_AMBULATORY_CARE_PROVIDER_SITE_OTHER): Payer: Medicare Other | Admitting: *Deleted

## 2016-01-22 ENCOUNTER — Emergency Department (HOSPITAL_COMMUNITY)
Admission: EM | Admit: 2016-01-22 | Discharge: 2016-01-23 | Disposition: A | Discharge status: Home / Self Care | Payer: Medicare Other | Attending: Emergency Medicine | Admitting: Emergency Medicine

## 2016-01-22 DIAGNOSIS — Z950 Presence of cardiac pacemaker: Secondary | ICD-10-CM | POA: Insufficient documentation

## 2016-01-22 DIAGNOSIS — R42 Dizziness and giddiness: Secondary | ICD-10-CM | POA: Insufficient documentation

## 2016-01-22 DIAGNOSIS — R51 Headache: Secondary | ICD-10-CM

## 2016-01-22 DIAGNOSIS — E869 Volume depletion, unspecified: Secondary | ICD-10-CM | POA: Diagnosis not present

## 2016-01-22 DIAGNOSIS — E119 Type 2 diabetes mellitus without complications: Secondary | ICD-10-CM | POA: Diagnosis not present

## 2016-01-22 DIAGNOSIS — F17200 Nicotine dependence, unspecified, uncomplicated: Secondary | ICD-10-CM | POA: Insufficient documentation

## 2016-01-22 DIAGNOSIS — I10 Essential (primary) hypertension: Secondary | ICD-10-CM | POA: Insufficient documentation

## 2016-01-22 DIAGNOSIS — I4891 Unspecified atrial fibrillation: Secondary | ICD-10-CM | POA: Diagnosis not present

## 2016-01-22 DIAGNOSIS — Z5181 Encounter for therapeutic drug level monitoring: Secondary | ICD-10-CM | POA: Diagnosis not present

## 2016-01-22 DIAGNOSIS — Z8673 Personal history of transient ischemic attack (TIA), and cerebral infarction without residual deficits: Secondary | ICD-10-CM | POA: Diagnosis not present

## 2016-01-22 DIAGNOSIS — S0990XA Unspecified injury of head, initial encounter: Secondary | ICD-10-CM | POA: Diagnosis not present

## 2016-01-22 DIAGNOSIS — Z794 Long term (current) use of insulin: Secondary | ICD-10-CM | POA: Diagnosis not present

## 2016-01-22 DIAGNOSIS — Z7901 Long term (current) use of anticoagulants: Secondary | ICD-10-CM | POA: Insufficient documentation

## 2016-01-22 DIAGNOSIS — R519 Headache, unspecified: Secondary | ICD-10-CM

## 2016-01-22 LAB — BASIC METABOLIC PANEL
Anion gap: 10 (ref 5–15)
BUN: 28 mg/dL — ABNORMAL HIGH (ref 6–20)
CO2: 26 mmol/L (ref 22–32)
Calcium: 9.8 mg/dL (ref 8.9–10.3)
Chloride: 99 mmol/L — ABNORMAL LOW (ref 101–111)
Creatinine, Ser: 1.3 mg/dL — ABNORMAL HIGH (ref 0.44–1.00)
GFR calc Af Amer: 42 mL/min — ABNORMAL LOW (ref >60)
GFR calc non Af Amer: 37 mL/min — ABNORMAL LOW (ref >60)
Glucose, Bld: 138 mg/dL — ABNORMAL HIGH (ref 65–99)
Potassium: 4.6 mmol/L (ref 3.5–5.1)
Sodium: 135 mmol/L (ref 135–145)

## 2016-01-22 LAB — URINALYSIS, ROUTINE W REFLEX MICROSCOPIC
Bilirubin Urine: NEGATIVE
Glucose, UA: NEGATIVE mg/dL
Hgb urine dipstick: NEGATIVE
Ketones, ur: NEGATIVE mg/dL
Nitrite: NEGATIVE
Protein, ur: NEGATIVE mg/dL
Specific Gravity, Urine: 1.017 (ref 1.005–1.030)
pH: 6.5 (ref 5.0–8.0)

## 2016-01-22 LAB — CBC
HCT: 44.7 % (ref 36.0–46.0)
Hemoglobin: 14.7 g/dL (ref 12.0–15.0)
MCH: 31.8 pg (ref 26.0–34.0)
MCHC: 32.9 g/dL (ref 30.0–36.0)
MCV: 96.8 fL (ref 78.0–100.0)
Platelets: 165 10*3/uL (ref 150–400)
RBC: 4.62 MIL/uL (ref 3.87–5.11)
RDW: 13 % (ref 11.5–15.5)
WBC: 8.6 10*3/uL (ref 4.0–10.5)

## 2016-01-22 LAB — URINE MICROSCOPIC-ADD ON

## 2016-01-22 LAB — CBG MONITORING, ED: Glucose-Capillary: 155 mg/dL — ABNORMAL HIGH (ref 65–99)

## 2016-01-22 LAB — POCT INR: INR: 2.1

## 2016-01-22 MED ORDER — SODIUM CHLORIDE 0.9 % IV BOLUS (SEPSIS)
1000.0000 mL | Freq: Once | INTRAVENOUS | Status: AC
  Administered 2016-01-22: 1000 mL via INTRAVENOUS

## 2016-01-22 NOTE — ED Triage Notes (Signed)
Pt c/o dizziness since Sunday. Pt states she tripped and fell hitting her head two weeks ago. Pt is on coumadin. Pt states as soon as she gets up in the morning she is dizzy. Pt states taking 0.5 of ativan around 1230.

## 2016-01-22 NOTE — ED Provider Notes (Signed)
[facility] DEPT Provider Note   CSN: [id number] Arrival date & time: 01/22/16  1607     History   Chief Complaint Chief Complaint  Patient presents with  . Dizziness    HPI [name] is a 82 y.o. female.     The history is provided by the patient.  Patient reports feeling lightheaded when she stood up this morning.  She's been feeling dizzy.  Does report that she struck her head approximately 2 weeks ago where she tripped and fell and struck the left side of her face on the chair.  No loss consciousness or neck pain.  She does not have any significant headache at this time.  She denies weakness of her arms or legs.  No chest pain shortness breath.  Denies abdominal pain.  Denies a sensation of the room spinning.  Denies ataxia  Past Medical History:  Diagnosis Date  . Atrial fibrillation ([place]) 02/24/2013  . Depression   . Diabetes mellitus without complication ([place])   . Hypertension   . Osteopenia   . TIA (transient ischemic attack)     Patient Active Problem List   Diagnosis Date Noted  . Chronic anticoagulation 02/10/2014  . Pure hypercholesterolemia 02/10/2014  . Encounter for therapeutic drug monitoring 06/15/2013  . Atrioventricular block, complete ([place]) 06/07/2013  . Pacemaker-Medtronic 06/07/2013  . Atrial fibrillation ([place]) 02/24/2013    Past Surgical History:  Procedure Laterality Date  . ABLATION  2012   AVN ablation by Dr Lovena Le  . cateracts     eyes  . CYST REMOVAL TRUNK     from breast  . PACEMAKER INSERTION  2008; 07/18/2013   MDT dual chamber pacemaker implanted 2008 by Dr Leonia Reeves; gen change 2015 by Dr Caryl Comes  . PERMANENT PACEMAKER GENERATOR CHANGE N/A 07/18/2013   Procedure: PERMANENT PACEMAKER GENERATOR CHANGE;  Surgeon: Deboraha Sprang, MD;  Location: [facility] CATH LAB;  Service: Cardiovascular;  Laterality: N/A;    OB History    No data available       Home Medications    Prior to Admission medications   Medication Sig Start Date End  Date Taking? Authorizing Provider  escitalopram (LEXAPRO) 20 MG tablet Take 20 mg by mouth daily. 02/10/15   Historical Provider, MD  hydrochlorothiazide (HYDRODIURIL) 12.5 MG tablet PT takes 1/2 tablet daily    Historical Provider, MD  Insulin Glargine (TOUJEO SOLOSTAR) 300 UNIT/ML SOPN Inject 4 Units/day into the skin.    Historical Provider, MD  LORazepam (ATIVAN) 1 MG tablet Take 1 mg by mouth at bedtime as needed for anxiety or sleep.     Historical Provider, MD  Multiple Vitamin (MULTIVITAMIN) tablet Take 1 tablet by mouth daily.    Historical Provider, MD  pregabalin (LYRICA) 50 MG capsule Take 1 capsule (50 mg total) by mouth daily. 09/18/15   [name], DPM  telmisartan (MICARDIS) 80 MG tablet Take 80 mg by mouth daily.    Historical Provider, MD  warfarin (COUMADIN) 1 MG tablet take as directed BY COUMADIN CLINIC 01/07/16   [name], MD  zolpidem (AMBIEN) 5 MG tablet Take 5 mg by mouth at bedtime as needed for sleep.    Historical Provider, MD    Family History Family History  Problem Relation Age of Onset  . Stroke Father     Social History Social History  Substance Use Topics  . Smoking status: Current Every Day Smoker  . Smokeless tobacco: Never Used  . Alcohol use No  Allergies   Eliquis [apixaban]; Gabapentin; Rozerem [ramelteon]; Temazepam; Tikosyn [dofetilide]; and Tradjenta [linagliptin]   Review of Systems Review of Systems  All other systems reviewed and are negative.    Physical Exam Updated Vital Signs BP 167/73   Pulse 70   Temp 98 F (36.7 C) (Oral)   Resp 18   SpO2 97%   Physical Exam  Constitutional: She is oriented to person, place, and time. She appears well-developed and well-nourished. No distress.  HENT:  Head: Normocephalic and atraumatic.  Eyes: EOM are normal. Pupils are equal, round, and reactive to light.  Neck: Normal range of motion.  Cardiovascular: Normal rate, regular rhythm and normal heart sounds.     Pulmonary/Chest: Effort normal and breath sounds normal.  Abdominal: Soft. She exhibits no distension. There is no tenderness.  Musculoskeletal: Normal range of motion.  Neurological: She is alert and oriented to person, place, and time.  5/5 strength in major muscle groups of  bilateral upper and lower extremities. Speech normal. No facial asymetry.   Skin: Skin is warm and dry.  Psychiatric: She has a normal mood and affect. Judgment normal.  Nursing note and vitals reviewed.    ED Treatments / Results  Labs (all labs ordered are listed, but only abnormal results are displayed) Labs Reviewed  BASIC METABOLIC PANEL - Abnormal; Notable for the following:       Result Value   Chloride 99 (*)    Glucose, Bld 138 (*)    BUN 28 (*)    Creatinine, Ser 1.30 (*)    GFR calc non Af Amer 37 (*)    GFR calc Af Amer 42 (*)    All other components within normal limits  URINALYSIS, ROUTINE W REFLEX MICROSCOPIC (NOT AT [facility]) - Abnormal; Notable for the following:    Leukocytes, UA MODERATE (*)    All other components within normal limits  URINE MICROSCOPIC-ADD ON - Abnormal; Notable for the following:    Squamous Epithelial / LPF 0-5 (*)    Bacteria, UA RARE (*)    All other components within normal limits  CBG MONITORING, ED - Abnormal; Notable for the following:    Glucose-Capillary 155 (*)    All other components within normal limits  CBC   BUN  Date Value Ref Range Status  01/22/2016 28 (H) 6 - 20 mg/dL Final  08/20/2015 23 (H) 6 - 20 mg/dL Final  09/17/2014 34 (H) 6 - 20 mg/dL Final  09/17/2014 26 (H) 6 - 20 mg/dL Final   Creatinine, Ser  Date Value Ref Range Status  01/22/2016 1.30 (H) 0.44 - 1.00 mg/dL Final  08/20/2015 1.28 (H) 0.44 - 1.00 mg/dL Final  09/17/2014 1.10 (H) 0.44 - 1.00 mg/dL Final  09/17/2014 1.24 (H) 0.44 - 1.00 mg/dL Final      EKG  EKG Interpretation None       Radiology Ct Head Wo Contrast  Result Date: 01/22/2016 CLINICAL DATA:  Dizziness  for 3 days. Tripped and fell resulting in head injury 2 weeks ago, on Coumadin. History of atrial fibrillation, hypertension and diabetes. EXAM: CT HEAD WITHOUT CONTRAST TECHNIQUE: Contiguous axial images were obtained from the base of the skull through the vertex without intravenous contrast. COMPARISON:  CT HEAD Oct 14, 2006 FINDINGS: BRAIN: The ventricles and sulci are normal for age. No intraparenchymal hemorrhage, mass effect nor midline shift. Minimal supratentorial white matter hypodensities less than expected for patient's age, though non-specific are most compatible with chronic small vessel ischemic disease.  No acute large vascular territory infarcts. No abnormal extra-axial fluid collections. Basal cisterns are patent. VASCULAR: Moderate calcific atherosclerosis of the carotid siphons. SKULL: No skull fracture. No significant scalp soft tissue swelling. SINUSES/ORBITS: The included ocular globes and orbital contents are non-suspicious. Status post bilateral ocular lens implants. The mastoid air-cells and included paranasal sinuses are well-aerated. OTHER: None. IMPRESSION: Normal noncontrast CT HEAD for age. Electronically Signed   By: Elon Alas M.D.   On: 01/22/2016 17:36    Procedures Procedures (including critical care time)  Medications Ordered in ED Medications - No data to display   Initial Impression / Assessment and Plan / ED Course  I have reviewed the triage vital signs and the nursing notes.  Pertinent labs & imaging results that were available during my care of the patient were reviewed by me and considered in my medical decision making (see chart for details).  Clinical Course    Overall the patient is well-appearing.  She felt slightly lightheaded when standing up beside the bed.  No significant orthostatics.  She feels better after IV fluids.  No focal deficit on exam.  Doubt stroke.  Doubt vertigo.  I suspect much of this is secondary to volume depletion.  She has  baseline renal insufficiency which is unchanged for her.  Primary care follow-up.  She understands to return to the ER for new or worsening symptoms  Final Clinical Impressions(s) / ED Diagnoses   Final diagnoses:  None    New Prescriptions New Prescriptions   No medications on file     Jola Schmidt, MD 01/23/16 [phone]

## 2016-02-07 ENCOUNTER — Ambulatory Visit (INDEPENDENT_AMBULATORY_CARE_PROVIDER_SITE_OTHER): Payer: Medicare Other | Admitting: *Deleted

## 2016-02-07 ENCOUNTER — Telehealth: Payer: Self-pay | Admitting: Cardiology

## 2016-02-07 DIAGNOSIS — I442 Atrioventricular block, complete: Secondary | ICD-10-CM | POA: Diagnosis not present

## 2016-02-07 NOTE — Progress Notes (Signed)
Remote pacemaker transmission.   

## 2016-02-07 NOTE — Telephone Encounter (Signed)
Confirmed remote transmission w/ pt husband.   

## 2016-02-08 ENCOUNTER — Encounter: Payer: Self-pay | Admitting: Cardiology

## 2016-02-11 ENCOUNTER — Encounter (INDEPENDENT_AMBULATORY_CARE_PROVIDER_SITE_OTHER): Payer: Self-pay

## 2016-02-11 ENCOUNTER — Encounter: Payer: Self-pay | Admitting: Cardiology

## 2016-02-11 ENCOUNTER — Ambulatory Visit (INDEPENDENT_AMBULATORY_CARE_PROVIDER_SITE_OTHER): Payer: Medicare Other | Admitting: Cardiology

## 2016-02-11 VITALS — BP 114/60 | HR 98 | Ht 63.0 in | Wt 94.0 lb

## 2016-02-11 DIAGNOSIS — R5383 Other fatigue: Secondary | ICD-10-CM | POA: Diagnosis not present

## 2016-02-11 DIAGNOSIS — Z7901 Long term (current) use of anticoagulants: Secondary | ICD-10-CM

## 2016-02-11 DIAGNOSIS — I442 Atrioventricular block, complete: Secondary | ICD-10-CM

## 2016-02-11 DIAGNOSIS — I482 Chronic atrial fibrillation, unspecified: Secondary | ICD-10-CM

## 2016-02-11 NOTE — Patient Instructions (Signed)
Medication Instructions:  Please discontinue your HCTZ. Continue all other medications as listed.  Follow-Up: Follow up in 1 year with Dr. Marlou Porch.  You will receive a letter in the mail 2 months before you are due.  Please call us when you receive this letter to schedule your follow up appointment.  If you need a refill on your cardiac medications before your next appointment, please call your pharmacy.  Thank you for choosing [facility]!!

## 2016-02-11 NOTE — Progress Notes (Signed)
[name]. [address], [address] [place], [place]  [address] Phone: [phone] Fax:  [phone]  Date:  02/11/2016   ID:  [name], DOB 28-Feb-1929, MRN [id number]  PCP:  Gara Kroner, MD   History of Present Illness: [name] is a 82 y.o. female with history of permanent atrial fibrillation, status post AV nodal ablation in 2010 for persistent symptoms. Diabetes, EF 45% likely from chronic pacing. Septal wall hypokinesis noted a nuclear stress test which actually showed an EF of 64%. Her pacemaker has been functioning normally. Underlying atrial fibrillation noted.  Had joint pain from Trajenta.  Still continues to smoke. Cough. Allergies. No fevers noted.  No fevers.  She did not like taking Eliquis. Would rather stay on Coumadin. This is fine. Her levels have been stable.  Her main complaint seems to be fatigued, dizziness, headaches since August. She has been discussing this with Dr. Moreen Fowler. She is not noticed any blood loss. She is on anticoagulation, Coumadin. No chest pain, no significant change in shortness of breath.   Wt Readings from Last 3 Encounters:  08/09/15 101 lb 3.2 oz (45.9 kg)  08/08/15 100 lb (45.4 kg)  02/12/15 98 lb 1.9 oz (44.5 kg)     Past Medical History:  Diagnosis Date  . Atrial fibrillation ([place]) 02/24/2013  . Depression   . Diabetes mellitus without complication ([place])   . Hypertension   . Osteopenia   . TIA (transient ischemic attack)     Past Surgical History:  Procedure Laterality Date  . ABLATION  2012   AVN ablation by Dr Lovena Le  . cateracts     eyes  . CYST REMOVAL TRUNK     from breast  . PACEMAKER INSERTION  2008; 07/18/2013   MDT dual chamber pacemaker implanted 2008 by Dr Leonia Reeves; gen change 2015 by Dr Caryl Comes  . PERMANENT PACEMAKER GENERATOR CHANGE N/A 07/18/2013   Procedure: PERMANENT PACEMAKER GENERATOR CHANGE;  Surgeon: Deboraha Sprang, MD;  Location: [facility] CATH LAB;  Service: Cardiovascular;  Laterality: N/A;     Current Outpatient Prescriptions  Medication Sig Dispense Refill  . escitalopram (LEXAPRO) 20 MG tablet Take 20 mg by mouth daily.  0  . Eszopiclone (ESZOPICLONE) 3 MG TABS Take 3 mg by mouth at bedtime. Take immediately before bedtime    . hydrochlorothiazide (HYDRODIURIL) 12.5 MG tablet Take 6.25 mg by mouth daily. PT takes 1/2 tablet daily     . Insulin Glargine (TOUJEO SOLOSTAR) 300 UNIT/ML SOPN Inject 4 Units/day into the skin.    Marland Kitchen LORazepam (ATIVAN) 1 MG tablet Take 1 mg by mouth at bedtime as needed for anxiety or sleep.     . Multiple Vitamin (MULTIVITAMIN) tablet Take 1 tablet by mouth daily.    . pregabalin (LYRICA) 50 MG capsule Take 1 capsule (50 mg total) by mouth daily. 30 capsule 0  . telmisartan (MICARDIS) 80 MG tablet Take 80 mg by mouth daily.    Marland Kitchen warfarin (COUMADIN) 1 MG tablet take as directed BY COUMADIN CLINIC (Patient taking differently: Take 2mg  everyday except Friday take 1mg .) 60 tablet 3   No current facility-administered medications for this visit.     Allergies:    Allergies  Allergen Reactions  . Eliquis [Apixaban] Other (See Comments)    Made pt have weak muscles  . Gabapentin     SHAKES AND NAUSEA   . Rozerem [Ramelteon]     DIZZY AND HEAD THROBBING  . Temazepam  INSOMNIA  . Tikosyn [Dofetilide]     Mouth swells, itching  . Tradjenta [Linagliptin] Diarrhea    Joint Pain, Stomach issues    Social History:  The patient  reports that she has been smoking.  She has never used smokeless tobacco. She reports that she does not drink alcohol or use drugs.   ROS:  Please see the history of present illness.   Denies any bleeding, syncope, new strokelike symptoms. No dizziness, no chest pain. Positive for cough, occasional depression, snoring, anxiety, leg pain, excessive fatigue.   PHYSICAL EXAM: VS:  There were no vitals taken for this visit. Thin, in no acute distress  HEENT: normal  Neck: no JVD  Cardiac:  normal S1, S2; RRR; no murmur   Lungs:  No significant wheezing, normal respiratory effort.  Abd: soft, nontender, no hepatomegaly  Ext: no edema  Skin: warm and dry  Neuro: no focal abnormalities noted  EKG:  Atrial fibrillation rate 71, ventricular pacing.     Echocardiogram: 08/08/14-normal ejection fraction, there was an oscillating density on pacemaker lead.  ASSESSMENT AND PLAN:  1. Atrial fibrillation- CHADS-VASc 5. Off Eliquis back on coumadin (leg were rubbery). Off of her aspirin. AV nodal ablation. Blood work has been reassuring.  2. Essential hypertension-blood pressure excellent.I discontinued her low-dose HCTZ 6.25 mg once a day. Dyspnea precipitated some of her dehydration which was diagnosed in the emergency room.  3. Pacemaker-reviewed office note on from Dr. Caryl Comes. Pacemaker battery update occurred on 07/18/13. 4. Protein calorie malnutrition-BMI 17. Encouraged protein, this will also help with fatigue. She is eating [employer]. 5. Tobacco use-encourage cessation. She does not wish to quit. 6. COPD/bronchitis-likely from extensive smoking history. Cough heard today. No wheezing heard today. Continue to work with Dr. Moreen Fowler, his PA saw her as well, helping her with allergies. 45. Dizziness-pacemaker working well. ER visit reviewed. Head CT normal. Stopping HCTZ. 8. AV block/AV nodal ablation-permanent pacemaker required. Pacemaker dependent. Dr. Caryl Comes watching.  9. Six-month follow-up. She is going to be seeing Dr. Moreen Fowler soon.  Signed, Candee Furbish, MD [facility]  02/11/2016 12:38 PM

## 2016-02-15 DIAGNOSIS — I129 Hypertensive chronic kidney disease with stage 1 through stage 4 chronic kidney disease, or unspecified chronic kidney disease: Secondary | ICD-10-CM | POA: Diagnosis not present

## 2016-02-15 DIAGNOSIS — Z23 Encounter for immunization: Secondary | ICD-10-CM | POA: Diagnosis not present

## 2016-02-15 DIAGNOSIS — Z794 Long term (current) use of insulin: Secondary | ICD-10-CM | POA: Diagnosis not present

## 2016-02-15 DIAGNOSIS — E1149 Type 2 diabetes mellitus with other diabetic neurological complication: Secondary | ICD-10-CM | POA: Diagnosis not present

## 2016-02-15 DIAGNOSIS — F411 Generalized anxiety disorder: Secondary | ICD-10-CM | POA: Diagnosis not present

## 2016-02-17 ENCOUNTER — Encounter (HOSPITAL_COMMUNITY): Payer: Self-pay | Admitting: *Deleted

## 2016-02-17 ENCOUNTER — Ambulatory Visit (HOSPITAL_COMMUNITY)
Admission: EM | Admit: 2016-02-17 | Discharge: 2016-02-17 | Disposition: A | Discharge status: Home / Self Care | Payer: Medicare Other | Attending: Family Medicine | Admitting: Family Medicine

## 2016-02-17 DIAGNOSIS — R42 Dizziness and giddiness: Secondary | ICD-10-CM

## 2016-02-17 NOTE — ED Triage Notes (Signed)
C/O having a "swimmy head for quite some time"; had f/u with cardiologist 6 days ago - HCTZ was discontinued; states "felt fine" for couple days.  Now having dizziness over past 2 days.  States feels like room is spinning; much worse when bending over, turning head, or changing positions.  Concerned because BP 158/88 at home.  Has been checking CBGs at home - today = 93, consistent with her normal.

## 2016-02-17 NOTE — Discharge Instructions (Signed)
Patient should go the ED for further evaluation and possible intervention,

## 2016-02-17 NOTE — ED Provider Notes (Signed)
CSN: [id number]     Arrival date & time 02/17/16  1656 History   None    Chief Complaint  Patient presents with  . Dizziness   (Consider location/radiation/quality/duration/timing/severity/associated sxs/prior Treatment) 82 y.o. female history  Atrial fibulation and pacemaker implantation presents with dizziness  X 1 month. Patient describes it as "swimmy headed' Condition is acute in nature. Condition is made better by nothing. Condition is made worse by bending over. Patient describes it as she spinning  Patient denies any treatment prior to there arrival at this facility. Patient denies any trauma recent injuries or loss of consciousness. Patient states that she has been sent by cardiology for a CT scan approximately 1 month ago which was negative. Patient denies any neurological deficits. Patient was seen by cardiology earlier in the week and was taken off of her HCTZ. Patient states that her symptoms improved for a couple of days and then returned       Past Medical History:  Diagnosis Date  . Atrial fibrillation ([place]) 02/24/2013  . Depression   . Diabetes mellitus without complication ([place])   . Hypertension   . Osteopenia   . Pacemaker   . TIA (transient ischemic attack)    Past Surgical History:  Procedure Laterality Date  . ABLATION  2012   AVN ablation by Dr Lovena Le  . cateracts     eyes  . CYST REMOVAL TRUNK     from breast  . PACEMAKER INSERTION    . PERMANENT PACEMAKER GENERATOR CHANGE N/A 07/18/2013   Procedure: PERMANENT PACEMAKER GENERATOR CHANGE;  Surgeon: Deboraha Sprang, MD;  Location: [facility] CATH LAB;  Service: Cardiovascular;  Laterality: N/A;   Family History  Problem Relation Age of Onset  . Stroke Father    Social History  Substance Use Topics  . Smoking status: Current Every Day Smoker  . Smokeless tobacco: Never Used  . Alcohol use No   OB History    No data available     Review of Systems  Constitutional: Negative.   HENT: Negative.    Respiratory: Negative.   Cardiovascular: Negative.   Gastrointestinal: Negative.   Genitourinary: Negative.   Neurological: Positive for dizziness. Negative for tremors, syncope, speech difficulty, weakness and headaches.    Allergies  Eliquis [apixaban]; Gabapentin; Rozerem [ramelteon]; Temazepam; Tikosyn [dofetilide]; and Tradjenta [linagliptin]  Home Medications   Prior to Admission medications   Medication Sig Start Date End Date Taking? Authorizing Provider  escitalopram (LEXAPRO) 20 MG tablet Take 20 mg by mouth daily. 02/10/15  Yes Historical Provider, MD  Eszopiclone (ESZOPICLONE) 3 MG TABS Take 3 mg by mouth at bedtime. Take immediately before bedtime   Yes Historical Provider, MD  Insulin Glargine (TOUJEO SOLOSTAR) 300 UNIT/ML SOPN Inject 4 Units/day into the skin.   Yes Historical Provider, MD  LORazepam (ATIVAN) 1 MG tablet Take 1 mg by mouth at bedtime as needed for anxiety or sleep.    Yes Historical Provider, MD  Multiple Vitamin (MULTIVITAMIN) tablet Take 1 tablet by mouth daily.   Yes Historical Provider, MD  pregabalin (LYRICA) 50 MG capsule Take 1 capsule (50 mg total) by mouth daily. 09/18/15  Yes [name], DPM  telmisartan (MICARDIS) 80 MG tablet Take 80 mg by mouth daily.   Yes Historical Provider, MD  warfarin (COUMADIN) 1 MG tablet take as directed BY COUMADIN CLINIC Patient taking differently: Take 2mg  everyday except Friday take 1mg . 01/07/16  Yes [name], MD   Meds Ordered and Administered this Visit  Medications - No data to display  BP 168/80 (BP Location: Left Arm)   Pulse 85   Temp 97.6 F (36.4 C) (Oral)   Resp 18   Ht 5\' 3"  (1.6 m)   Wt 94 lb (42.6 kg)   SpO2 100%   BMI 16.65 kg/m  No data found.   Physical Exam  Constitutional: She is oriented to person, place, and time. She appears well-developed and well-nourished.  HENT:  Head: Normocephalic and atraumatic.  Eyes: Conjunctivae are normal. Pupils are equal, round, and reactive  to light.  Neck: Normal range of motion.  Cardiovascular: Normal rate.   Pulmonary/Chest: Effort normal and breath sounds normal.  Musculoskeletal: Normal range of motion.  Neurological: She is alert and oriented to person, place, and time.  Skin: Skin is warm.  Psychiatric: She has a normal mood and affect.  Nursing note and vitals reviewed.   Urgent Care Course   Clinical Course    Procedures (including critical care time)  Labs Review Labs Reviewed - No data to display  Imaging Review No results found.   Visual Acuity Review  Right Eye Distance:   Left Eye Distance:   Bilateral Distance:    Right Eye Near:   Left Eye Near:    Bilateral Near:     Patient education provided regarding risks associated with not getting treatment in the ED.     MDM   1. Dizziness        Jacqualine Mau, NP 02/17/16 [place], NP 02/17/16 1824

## 2016-02-18 ENCOUNTER — Telehealth: Payer: Self-pay | Admitting: Cardiology

## 2016-02-18 DIAGNOSIS — R42 Dizziness and giddiness: Secondary | ICD-10-CM

## 2016-02-18 NOTE — Telephone Encounter (Signed)
Advised Dr. Kingsley Plan and his nurse are not in the office today. He understands office will call him once reviewed/advised on by physician.

## 2016-02-18 NOTE — Telephone Encounter (Signed)
New message    Pt calling stating that she went to the urgent care on yesterday and was told to have a carotid done. Please put in an order.

## 2016-02-18 NOTE — Telephone Encounter (Signed)
Please order carotid Doppler-dizziness Candee Furbish, MD

## 2016-02-19 NOTE — Telephone Encounter (Signed)
Order for carotid placed as requested.  Pt to be contacted for an appt.

## 2016-02-20 NOTE — Telephone Encounter (Signed)
appt has been scheduled for carotid 10/6 at [facility] office.

## 2016-02-22 ENCOUNTER — Ambulatory Visit (HOSPITAL_COMMUNITY)
Admission: RE | Admit: 2016-02-22 | Discharge: 2016-02-22 | Disposition: A | Discharge status: Home / Self Care | Payer: Medicare Other | Source: Ambulatory Visit | Attending: Cardiology | Admitting: Cardiology

## 2016-02-22 DIAGNOSIS — I10 Essential (primary) hypertension: Secondary | ICD-10-CM | POA: Diagnosis not present

## 2016-02-22 DIAGNOSIS — Z8673 Personal history of transient ischemic attack (TIA), and cerebral infarction without residual deficits: Secondary | ICD-10-CM | POA: Insufficient documentation

## 2016-02-22 DIAGNOSIS — I6523 Occlusion and stenosis of bilateral carotid arteries: Secondary | ICD-10-CM | POA: Diagnosis not present

## 2016-02-22 DIAGNOSIS — E119 Type 2 diabetes mellitus without complications: Secondary | ICD-10-CM | POA: Diagnosis not present

## 2016-02-22 DIAGNOSIS — R42 Dizziness and giddiness: Secondary | ICD-10-CM

## 2016-02-26 DIAGNOSIS — H6121 Impacted cerumen, right ear: Secondary | ICD-10-CM | POA: Diagnosis not present

## 2016-02-26 DIAGNOSIS — R42 Dizziness and giddiness: Secondary | ICD-10-CM | POA: Diagnosis not present

## 2016-02-28 LAB — CUP PACEART REMOTE DEVICE CHECK
Battery Impedance: 160 Ohm
Battery Remaining Longevity: 132 mo
Battery Voltage: 2.78 V
Brady Statistic RV Percent Paced: 100 %
Date Time Interrogation Session: 20170921150942
Implantable Lead Implant Date: 20080416
Implantable Lead Implant Date: 20080416
Implantable Lead Location: 753859
Implantable Lead Location: 753860
Implantable Lead Model: 4076
Implantable Lead Model: 5076
Lead Channel Impedance Value: 566 Ohm
Lead Channel Impedance Value: 67 Ohm
Lead Channel Pacing Threshold Amplitude: 0.625 V
Lead Channel Pacing Threshold Pulse Width: 0.4 ms
Lead Channel Setting Pacing Amplitude: 2.5 V
Lead Channel Setting Pacing Pulse Width: 0.4 ms
Lead Channel Setting Sensing Sensitivity: 5.6 mV

## 2016-03-03 ENCOUNTER — Ambulatory Visit (INDEPENDENT_AMBULATORY_CARE_PROVIDER_SITE_OTHER): Payer: Medicare Other | Admitting: *Deleted

## 2016-03-03 DIAGNOSIS — Z5181 Encounter for therapeutic drug level monitoring: Secondary | ICD-10-CM

## 2016-03-03 DIAGNOSIS — I4891 Unspecified atrial fibrillation: Secondary | ICD-10-CM | POA: Diagnosis not present

## 2016-03-03 LAB — POCT INR: INR: 2.1

## 2016-03-05 ENCOUNTER — Encounter: Payer: Self-pay | Admitting: Diagnostic Neuroimaging

## 2016-03-05 ENCOUNTER — Ambulatory Visit (INDEPENDENT_AMBULATORY_CARE_PROVIDER_SITE_OTHER): Payer: Medicare Other | Admitting: Diagnostic Neuroimaging

## 2016-03-05 VITALS — BP 114/61 | HR 68 | Ht 63.0 in | Wt 94.6 lb

## 2016-03-05 DIAGNOSIS — R269 Unspecified abnormalities of gait and mobility: Secondary | ICD-10-CM | POA: Diagnosis not present

## 2016-03-05 DIAGNOSIS — F0781 Postconcussional syndrome: Secondary | ICD-10-CM

## 2016-03-05 DIAGNOSIS — G44329 Chronic post-traumatic headache, not intractable: Secondary | ICD-10-CM | POA: Diagnosis not present

## 2016-03-05 NOTE — Progress Notes (Signed)
[facility]  PATIENT: [name] DOB: Nov 29, 1928  REFERRING CLINICIAN: D Swayne HISTORY FROM: patient and husband  REASON FOR VISIT: new consult     HISTORICAL  CHIEF COMPLAINT:  Chief Complaint  Patient presents with  . Headache    rm 6, husband- [name], "fell and hit left temple on arm of chair 1 month ago; dizziness, headache across my forehead, sometimes more on left side"    HISTORY OF PRESENT ILLNESS:   82 year old female here for evaluation of dizziness and headaches. History of diabetes, heart disease, anxiety, insomnia, atrial fibrillation on anticoagulation.  Beginning of September 2017 patient woke up in the middle the night, was walking around in the dark, reached for a chair, missed it and fell down. She struck her left temple on the arm of the chair, then returned to her bed and went back to sleep. Over the next few day she developed increasing pain and swelling in the left temporal region. She developed a very severe bruise around her left eye. 1-2 weeks later she went to the emergency room for evaluation, CT scan of the head was performed which showed no acute findings.  Since that time symptoms have continued. Patient continues to have balance and dizziness problems. She continues to have intermittent headaches. She's been using Tylenol without relief.  Patient has chronic insomnia and anxiety problems, currently on Lexapro, Lunesta, Ativan. Also takes Lyrica for diabetic neuropathy and pain in the feet.   REVIEW OF SYSTEMS: Full 14 system review of systems performed and negative with exception of: Fatigue spinning sensation easy bruising easy bleeding headache weakness dizziness tremor anxiety not asleep decreased energy insomnia restless legs.  ALLERGIES: Allergies  Allergen Reactions  . Eliquis [Apixaban] Other (See Comments)    Made pt have weak muscles  . Gabapentin     SHAKES AND NAUSEA   . Rozerem [Ramelteon]     DIZZY AND HEAD  THROBBING  . Temazepam     INSOMNIA  . Tikosyn [Dofetilide]     Mouth swells, itching  . Tradjenta [Linagliptin] Diarrhea    Joint Pain, Stomach issues    HOME MEDICATIONS: Outpatient Medications Prior to Visit  Medication Sig Dispense Refill  . escitalopram (LEXAPRO) 20 MG tablet Take 20 mg by mouth daily.  0  . Eszopiclone (ESZOPICLONE) 3 MG TABS Take 3 mg by mouth at bedtime. Take immediately before bedtime    . Insulin Glargine (TOUJEO SOLOSTAR) 300 UNIT/ML SOPN Inject 4 Units/day into the skin.    Marland Kitchen LORazepam (ATIVAN) 1 MG tablet Take 1 mg by mouth at bedtime as needed for anxiety or sleep.     . Multiple Vitamin (MULTIVITAMIN) tablet Take 1 tablet by mouth daily.    . pregabalin (LYRICA) 50 MG capsule Take 1 capsule (50 mg total) by mouth daily. 30 capsule 0  . telmisartan (MICARDIS) 80 MG tablet Take 80 mg by mouth daily.    Marland Kitchen warfarin (COUMADIN) 1 MG tablet take as directed BY COUMADIN CLINIC (Patient taking differently: Take 2mg  everyday except Friday take 1mg .) 60 tablet 3   No facility-administered medications prior to visit.     PAST MEDICAL HISTORY: Past Medical History:  Diagnosis Date  . Atrial fibrillation ([place]) 02/24/2013  . Depression   . Diabetes mellitus without complication ([place])   . Headache   . Hypertension   . Osteopenia   . Pacemaker   . TIA (transient ischemic attack)     PAST SURGICAL HISTORY: Past Surgical  History:  Procedure Laterality Date  . ABLATION  2012   AVN ablation by Dr Lovena Le  . cateracts     eyes  . CYST REMOVAL TRUNK     from breast  . PACEMAKER INSERTION    . PERMANENT PACEMAKER GENERATOR CHANGE N/A 07/18/2013   Procedure: PERMANENT PACEMAKER GENERATOR CHANGE;  Surgeon: Deboraha Sprang, MD;  Location: [facility] CATH LAB;  Service: Cardiovascular;  Laterality: N/A;    FAMILY HISTORY: Family History  Problem Relation Age of Onset  . Stroke Father     SOCIAL HISTORY:  Social History   Social History  . Marital status: Married      Spouse name: [name]  . Number of children: 0  . Years of education: 71   Occupational History  .      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 Smoking status: Current Every Day Smoker  . Smokeless tobacco: Never Used     Comment: 03/05/16 1/2 - 1 ppd  . Alcohol use No  . Drug use: No  . Sexual activity: Not on file   Other Topics Concern  . Not on file   Social History Narrative  . No narrative on file     PHYSICAL EXAM  GENERAL EXAM/CONSTITUTIONAL: Vitals:  Vitals:   03/05/16 1500  BP: 114/61  Pulse: 68  Weight: 94 lb 9.6 oz (42.9 kg)  Height: 5\' 3"  (1.6 m)     Body mass index is 16.76 kg/m.  Visual Acuity Screening   Right eye Left eye Both eyes  Without correction:     With correction: 20/50 20/100   Comments: 03/05/16 unable to do well with bifocals    Patient is in no distress; well developed, nourished and groomed; neck is supple  CARDIOVASCULAR:  Examination of carotid arteries is normal; no carotid bruits  Regular rate and rhythm, no murmurs  Examination of peripheral vascular system by observation and palpation is normal  EYES:  Ophthalmoscopic exam of optic discs and posterior segments is normal; no papilledema or hemorrhages  MUSCULOSKELETAL:  Gait, strength, tone, movements noted in Neurologic exam below  NEUROLOGIC: MENTAL STATUS:  No flowsheet data found.  awake, alert, oriented to person, place and time  recent and remote memory intact  normal attention and concentration  language fluent, comprehension intact, naming intact,   fund of knowledge appropriate  CRANIAL NERVE:   2nd - no papilledema on fundoscopic exam  2nd, 3rd, 4th, 6th - pupils --> IRREGULAR, POST-SURGICAL, NO REACTION; visual fields full to confrontation, extraocular muscles intact, no nystagmus  5th - facial sensation symmetric  7th - facial strength symmetric  8th - hearing intact  9th - palate elevates symmetrically, uvula  midline  11th - shoulder shrug symmetric  12th - tongue protrusion midline  MOTOR:   normal bulk and tone, full strength in the BUE, BLE  POSTURAL AND ACTION TREMOR IN BUE  HEAD AND VOICE TREMOR  SENSORY:   normal and symmetric to light touch, temperature, vibration  COORDINATION:   finger-nose-finger, fine finger movements normal  REFLEXES:   deep tendon reflexes TRACE and symmetric  GAIT/STATION:   narrow based gait; SLOW AND CAUTIOUS; UNSTEADY; DECR ARM SWING     DIAGNOSTIC DATA (LABS, IMAGING, TESTING) - I reviewed patient records, labs, notes, testing and imaging myself where available.  Lab Results  Component Value Date   WBC 8.6 01/22/2016   HGB 14.7 01/22/2016   HCT 44.7 01/22/2016   MCV 96.8 01/22/2016  PLT 165 01/22/2016      Component Value Date/Time   NA 135 01/22/2016 1641   K 4.6 01/22/2016 1641   CL 99 (L) 01/22/2016 1641   CO2 26 01/22/2016 1641   GLUCOSE 138 (H) 01/22/2016 1641   BUN 28 (H) 01/22/2016 1641   CREATININE 1.30 (H) 01/22/2016 1641   CALCIUM 9.8 01/22/2016 1641   PROT 6.4 07/07/2010 0045   ALBUMIN 3.8 07/07/2010 0045   AST 23 07/07/2010 0045   ALT 19 07/07/2010 0045   ALKPHOS 61 07/07/2010 0045   BILITOT 0.4 07/07/2010 0045   GFRNONAA 37 (L) 01/22/2016 1641   GFRAA 42 (L) 01/22/2016 1641   Lab Results  Component Value Date   CHOL  07/08/2010    89        ATP III CLASSIFICATION:  <200     mg/dL   Desirable  200-239  mg/dL   Borderline High  >=240    mg/dL   High          HDL 35 (L) 07/08/2010   LDLCALC  07/08/2010    34        Total Cholesterol/HDL:CHD Risk Coronary Heart Disease Risk Table                     Men   Women  1/2 Average Risk   3.4   3.3  Average Risk       5.0   4.4  2 X Average Risk   9.6   7.1  3 X Average Risk  23.4   11.0        Use the calculated Patient Ratio above and the CHD Risk Table to determine the patient's CHD Risk.        ATP III CLASSIFICATION (LDL):  <100     mg/dL    Optimal  100-129  mg/dL   Near or Above                    Optimal  130-159  mg/dL   Borderline  160-189  mg/dL   High  >190     mg/dL   Very High   TRIG 99 07/08/2010   CHOLHDL 2.5 07/08/2010   Lab Results  Component Value Date   HGBA1C (H) 07/07/2010    6.7 (NOTE)                                                                       According to the ADA Clinical Practice Recommendations for 2011, when HbA1c is used as a screening test:   >=6.5%   Diagnostic of Diabetes Mellitus           (if abnormal result  is confirmed)  5.7-6.4%   Increased risk of developing Diabetes Mellitus  References:Diagnosis and Classification of Diabetes Mellitus,Diabetes GYIR,4854,62(VOJJK 1):S62-S69 and Standards of Medical Care in         Diabetes - 2011,Diabetes Care,2011,34  (Suppl 1):S11-S61.   Lab Results  Component Value Date   [id number] 7169 (H) 06/02/2008   Lab Results  Component Value Date   TSH 2.50 10/25/2013    01/22/16 CT head [I reviewed images myself and agree with interpretation. -VRP]  - Normal noncontrast CT HEAD  for age.     ASSESSMENT AND PLAN  82 y.o. year old female here with trip and fall at home in the middle of the night, with resultant posttraumatic headaches and dizziness, consistent with postconcussion syndrome. Patient also has baseline balance and gait difficulty which may be related to underlying deconditioning, age, neuropathy. Patient also with significant anxiety and insomnia over a long time frame, with resultant polypharmacy which can contribute to balance difficulty.   Dx:  1. Chronic post-traumatic headache, not intractable   2. Post concussion syndrome   3. Gait difficulty      PLAN: - Refer to physical therapy for balance and gait training - Tylenol as needed for posttraumatic headaches - Caution with polypharmacy (Ativan, Lunesta, Lyrica) which can contribute to balance difficulty  Orders Placed This Encounter  Procedures  . Ambulatory referral  to Physical Therapy    Return in about 3 months (around 06/05/2016).    Penni Bombard, MD 97/84/7841, 2:82 PM Certified in Neurology, Neurophysiology and Neuroimaging  [facility] [address] [place], [place] [address] [phone]

## 2016-03-05 NOTE — Patient Instructions (Signed)

## 2016-03-17 DIAGNOSIS — F411 Generalized anxiety disorder: Secondary | ICD-10-CM | POA: Diagnosis not present

## 2016-03-17 DIAGNOSIS — E1149 Type 2 diabetes mellitus with other diabetic neurological complication: Secondary | ICD-10-CM | POA: Diagnosis not present

## 2016-03-17 DIAGNOSIS — I129 Hypertensive chronic kidney disease with stage 1 through stage 4 chronic kidney disease, or unspecified chronic kidney disease: Secondary | ICD-10-CM | POA: Diagnosis not present

## 2016-03-24 ENCOUNTER — Telehealth: Payer: Self-pay | Admitting: Cardiology

## 2016-03-24 NOTE — Telephone Encounter (Signed)
New message       Pt c/o medication issue:  1. Name of Medication: HCTZ 2. How are you currently taking this medication (dosage and times per day)?  3. Are you having a reaction (difficulty breathing--STAT)? no  4. What is your medication issue? Pt was c/o headache and dizziness.  Dr Marlou Porch stopped her HCTZ thinking it was the cause.  She later found out that she had slight concussion from falling over 1 month ago.  Her PCP says she can restart the HCTZ but Dr Marlou Porch has to restart it.  Pt states that her bp is slightly elevated and wishes to restart medication.  Please call

## 2016-03-24 NOTE — Telephone Encounter (Signed)
Ok to restart HCTZ  Candee Furbish, MD

## 2016-03-24 NOTE — Telephone Encounter (Signed)
I spoke with patient. I advised her ok to restart HCTZ 6.25mg  (1/2 12.5mg  tab) QD. She voiced thanks and understanding.

## 2016-03-24 NOTE — Telephone Encounter (Signed)
I spoke with pt. She states dizziness/HA was coming from concussion, not HCTZ.  She states PCP recommends she re-start HCTZ, but wants your permission/opion first. She states her BP has been high. She reports: 169/74, 159/80, 142/74.  Pt would like to restart HCTZ as soon as possible, states she feels like her BP is too high. Please advise.

## 2016-03-25 ENCOUNTER — Encounter: Payer: Self-pay | Admitting: Podiatry

## 2016-03-25 ENCOUNTER — Ambulatory Visit (INDEPENDENT_AMBULATORY_CARE_PROVIDER_SITE_OTHER): Payer: Medicare Other | Admitting: Podiatry

## 2016-03-25 VITALS — Ht 63.0 in | Wt 94.0 lb

## 2016-03-25 DIAGNOSIS — M79676 Pain in unspecified toe(s): Secondary | ICD-10-CM | POA: Diagnosis not present

## 2016-03-25 DIAGNOSIS — B351 Tinea unguium: Secondary | ICD-10-CM

## 2016-03-25 DIAGNOSIS — E1142 Type 2 diabetes mellitus with diabetic polyneuropathy: Secondary | ICD-10-CM

## 2016-03-25 MED ORDER — PREGABALIN 50 MG PO CAPS: 50.0000 mg | ORAL_CAPSULE | Freq: Every day | ORAL | 0 refills | Status: DC

## 2016-03-25 NOTE — Progress Notes (Signed)
Patient ID: [name], female   DOB: [birth year], 82 y.o.   MRN: [id number] Complaint:  Visit Type: Patient returns to my office for continued preventative foot care services. Complaint: Patient states" my nails have grown long and thick and become painful to walk and wear shoes" Patient has been diagnosed with DM with no complications. He presents for preventative foot care services. No changes to ROS  Podiatric Exam: Vascular: dorsalis pedis and posterior tibial pulses are palpable bilateral. Capillary return is immediate. Temperature gradient is WNL. Skin turgor WNL  Sensorium: Normal Semmes Weinstein monofilament test. Normal tactile sensation bilaterally. Nail Exam: Pt has thick disfigured discolored nails with subungual debris noted bilateral entire nail hallux through fifth toenails Ulcer Exam: There is no evidence of ulcer or pre-ulcerative changes or infection. Orthopedic Exam: Muscle tone and strength are WNL. No limitations in general ROM. No crepitus or effusions noted. Foot type and digits show no abnormalities. Bony prominences are unremarkable. Skin: No Porokeratosis. No infection or ulcers  Diagnosis:  Tinea unguium, Pain in right toe, pain in left toes  Treatment & Plan Procedures and Treatment: Consent by patient was obtained for treatment procedures. The patient understood the discussion of treatment and procedures well. All questions were answered thoroughly reviewed. Debridement of mycotic and hypertrophic toenails, 1 through 5 bilateral and clearing of subungual debris. No ulceration, no infection noted.   Refill Lyrica 50 mg.  One at hs. Return Visit-Office Procedure: Patient instructed to return to the office for a follow up visit 3 months for continued evaluation and treatment.    [name] DPM

## 2016-03-28 ENCOUNTER — Ambulatory Visit: Payer: Medicare Other | Admitting: Podiatry

## 2016-03-31 DIAGNOSIS — E119 Type 2 diabetes mellitus without complications: Secondary | ICD-10-CM | POA: Diagnosis not present

## 2016-04-02 ENCOUNTER — Other Ambulatory Visit: Payer: Self-pay | Admitting: Cardiology

## 2016-04-02 NOTE — Telephone Encounter (Signed)
CALLED PT BACK TO EXPLAIN THAT THE RX FROM DR [facility] IS PERFECTLY FINE TO TAKE. LVM TO THAT AFFECT.

## 2016-04-02 NOTE — Telephone Encounter (Signed)
Pt was told her HCTZ was sent to her pharmacy on Nov 6 [facility] but [facility] said [facility] dont have it. She would like Dr.Skain to sent an auth to her PCP for him to do the prescription instead.

## 2016-04-02 NOTE — Telephone Encounter (Signed)
called to have [facility] call pt and let her know they have the RX for her HCTZ & that she DOES NOT NEED Dr Marlou Porch to give permission to her PCP to fill it if he wants to.   Spoke with Solmon Ice, she confirmed a RX from PCP and that pt picked it up, will call pt and explain that [place].

## 2016-04-04 DIAGNOSIS — E119 Type 2 diabetes mellitus without complications: Secondary | ICD-10-CM | POA: Diagnosis not present

## 2016-04-09 ENCOUNTER — Other Ambulatory Visit: Payer: Self-pay

## 2016-04-09 MED ORDER — HYDROCHLOROTHIAZIDE 12.5 MG PO TABS: 6.2500 mg | ORAL_TABLET | Freq: Every day | ORAL | 3 refills | Status: DC

## 2016-04-14 ENCOUNTER — Ambulatory Visit (INDEPENDENT_AMBULATORY_CARE_PROVIDER_SITE_OTHER): Payer: Medicare Other | Admitting: Pharmacist

## 2016-04-14 DIAGNOSIS — Z5181 Encounter for therapeutic drug level monitoring: Secondary | ICD-10-CM

## 2016-04-14 DIAGNOSIS — I4891 Unspecified atrial fibrillation: Secondary | ICD-10-CM

## 2016-04-14 LAB — POCT INR: INR: 2.3

## 2016-04-21 DIAGNOSIS — F411 Generalized anxiety disorder: Secondary | ICD-10-CM | POA: Diagnosis not present

## 2016-04-21 DIAGNOSIS — I129 Hypertensive chronic kidney disease with stage 1 through stage 4 chronic kidney disease, or unspecified chronic kidney disease: Secondary | ICD-10-CM | POA: Diagnosis not present

## 2016-04-21 DIAGNOSIS — Z794 Long term (current) use of insulin: Secondary | ICD-10-CM | POA: Diagnosis not present

## 2016-04-21 DIAGNOSIS — E1149 Type 2 diabetes mellitus with other diabetic neurological complication: Secondary | ICD-10-CM | POA: Diagnosis not present

## 2016-04-23 ENCOUNTER — Ambulatory Visit: Payer: Medicare Other | Admitting: Diagnostic Neuroimaging

## 2016-04-28 ENCOUNTER — Telehealth: Payer: Self-pay | Admitting: *Deleted

## 2016-04-28 MED ORDER — PREGABALIN 50 MG PO CAPS: 50.0000 mg | ORAL_CAPSULE | Freq: Every day | ORAL | 5 refills | Status: DC

## 2016-04-28 NOTE — Telephone Encounter (Signed)
Pt states she usually gets refills of the Lyrica, but this time did not. I reviewed pt's records and she is seen every 3 months for foot evaluation. Dr. Denyse Dago refill +5 more. Faxed to [facility] [phone].

## 2016-05-03 DIAGNOSIS — W5503XA Scratched by cat, initial encounter: Secondary | ICD-10-CM | POA: Diagnosis not present

## 2016-05-03 DIAGNOSIS — S76111A Strain of right quadriceps muscle, fascia and tendon, initial encounter: Secondary | ICD-10-CM | POA: Diagnosis not present

## 2016-05-05 DIAGNOSIS — B029 Zoster without complications: Secondary | ICD-10-CM | POA: Diagnosis not present

## 2016-05-05 DIAGNOSIS — G4700 Insomnia, unspecified: Secondary | ICD-10-CM | POA: Diagnosis not present

## 2016-05-05 DIAGNOSIS — S76111D Strain of right quadriceps muscle, fascia and tendon, subsequent encounter: Secondary | ICD-10-CM | POA: Diagnosis not present

## 2016-05-08 ENCOUNTER — Ambulatory Visit (INDEPENDENT_AMBULATORY_CARE_PROVIDER_SITE_OTHER): Payer: Medicare Other | Admitting: *Deleted

## 2016-05-08 DIAGNOSIS — I442 Atrioventricular block, complete: Secondary | ICD-10-CM

## 2016-05-08 NOTE — Progress Notes (Signed)
Remote pacemaker transmission.   

## 2016-05-09 LAB — CUP PACEART REMOTE DEVICE CHECK
Battery Impedance: 160 Ohm
Battery Remaining Longevity: 130 mo
Battery Voltage: 2.78 V
Brady Statistic RV Percent Paced: 100 %
Date Time Interrogation Session: 20171221131345
Implantable Lead Implant Date: 20080416
Implantable Lead Implant Date: 20080416
Implantable Lead Location: 753859
Implantable Lead Location: 753860
Implantable Lead Model: 4076
Implantable Lead Model: 5076
Implantable Pulse Generator Implant Date: 20150302
Lead Channel Impedance Value: 521 Ohm
Lead Channel Impedance Value: 67 Ohm
Lead Channel Pacing Threshold Amplitude: 0.625 V
Lead Channel Pacing Threshold Pulse Width: 0.4 ms
Lead Channel Setting Pacing Amplitude: 2.5 V
Lead Channel Setting Pacing Pulse Width: 0.4 ms
Lead Channel Setting Sensing Sensitivity: 5.6 mV

## 2016-05-20 ENCOUNTER — Ambulatory Visit: Payer: Medicare Other | Admitting: Neurology

## 2016-05-26 ENCOUNTER — Ambulatory Visit (INDEPENDENT_AMBULATORY_CARE_PROVIDER_SITE_OTHER): Payer: Medicare Other | Admitting: *Deleted

## 2016-05-26 DIAGNOSIS — Z5181 Encounter for therapeutic drug level monitoring: Secondary | ICD-10-CM

## 2016-05-26 DIAGNOSIS — I4891 Unspecified atrial fibrillation: Secondary | ICD-10-CM

## 2016-05-26 LAB — POCT INR: INR: 2.9

## 2016-05-26 MED ORDER — WARFARIN SODIUM 1 MG PO TABS: ORAL_TABLET | ORAL | 3 refills | Status: DC

## 2016-06-02 DIAGNOSIS — R112 Nausea with vomiting, unspecified: Secondary | ICD-10-CM | POA: Diagnosis not present

## 2016-06-02 DIAGNOSIS — R197 Diarrhea, unspecified: Secondary | ICD-10-CM | POA: Diagnosis not present

## 2016-06-24 ENCOUNTER — Ambulatory Visit: Payer: Medicare Other | Admitting: Podiatry

## 2016-07-01 ENCOUNTER — Encounter: Payer: Self-pay | Admitting: Podiatry

## 2016-07-01 ENCOUNTER — Ambulatory Visit (INDEPENDENT_AMBULATORY_CARE_PROVIDER_SITE_OTHER): Payer: Medicare Other | Admitting: Podiatry

## 2016-07-01 VITALS — Ht 63.0 in | Wt 94.0 lb

## 2016-07-01 DIAGNOSIS — E119 Type 2 diabetes mellitus without complications: Secondary | ICD-10-CM | POA: Diagnosis not present

## 2016-07-01 DIAGNOSIS — B351 Tinea unguium: Secondary | ICD-10-CM | POA: Diagnosis not present

## 2016-07-01 DIAGNOSIS — E1142 Type 2 diabetes mellitus with diabetic polyneuropathy: Secondary | ICD-10-CM

## 2016-07-01 DIAGNOSIS — M79676 Pain in unspecified toe(s): Secondary | ICD-10-CM

## 2016-07-01 NOTE — Progress Notes (Signed)
Patient ID: [name], female   DOB: November 24, 1928, 83 y.o.   MRN: [id number] Complaint:  Visit Type: Patient returns to my office for continued preventative foot care services. Complaint: Patient states" my nails have grown long and thick and become painful to walk and wear shoes" Patient has been diagnosed with DM with no complications. He presents for preventative foot care services. No changes to ROS  Podiatric Exam: Vascular: dorsalis pedis and posterior tibial pulses are palpable bilateral. Capillary return is immediate. Temperature gradient is WNL. Skin turgor WNL  Sensorium: Normal Semmes Weinstein monofilament test. Normal tactile sensation bilaterally. Nail Exam: Pt has thick disfigured discolored nails with subungual debris noted bilateral entire nail hallux through fifth toenails Ulcer Exam: There is no evidence of ulcer or pre-ulcerative changes or infection. Orthopedic Exam: Muscle tone and strength are WNL. No limitations in general ROM. No crepitus or effusions noted. Foot type and digits show no abnormalities. Bony prominences are unremarkable. Skin: No Porokeratosis. No infection or ulcers  Diagnosis:  Tinea unguium, Pain in right toe, pain in left toes  Treatment & Plan Procedures and Treatment: Consent by patient was obtained for treatment procedures. The patient understood the discussion of treatment and procedures well. All questions were answered thoroughly reviewed. Debridement of mycotic and hypertrophic toenails, 1 through 5 bilateral and clearing of subungual debris. No ulceration, no infection noted.    Return Visit-Office Procedure: Patient instructed to return to the office for a follow up visit 3 months for continued evaluation and treatment.    [name] DPM

## 2016-07-04 ENCOUNTER — Encounter: Payer: Self-pay | Admitting: Cardiology

## 2016-07-07 ENCOUNTER — Ambulatory Visit (INDEPENDENT_AMBULATORY_CARE_PROVIDER_SITE_OTHER): Payer: Medicare Other

## 2016-07-07 DIAGNOSIS — I4891 Unspecified atrial fibrillation: Secondary | ICD-10-CM | POA: Diagnosis not present

## 2016-07-07 DIAGNOSIS — Z5181 Encounter for therapeutic drug level monitoring: Secondary | ICD-10-CM | POA: Diagnosis not present

## 2016-07-07 LAB — POCT INR: INR: 2.2

## 2016-07-15 DIAGNOSIS — E119 Type 2 diabetes mellitus without complications: Secondary | ICD-10-CM | POA: Diagnosis not present

## 2016-07-15 DIAGNOSIS — H353131 Nonexudative age-related macular degeneration, bilateral, early dry stage: Secondary | ICD-10-CM | POA: Diagnosis not present

## 2016-07-15 DIAGNOSIS — H43813 Vitreous degeneration, bilateral: Secondary | ICD-10-CM | POA: Diagnosis not present

## 2016-07-22 DIAGNOSIS — F411 Generalized anxiety disorder: Secondary | ICD-10-CM | POA: Diagnosis not present

## 2016-07-22 DIAGNOSIS — N183 Chronic kidney disease, stage 3 (moderate): Secondary | ICD-10-CM | POA: Diagnosis not present

## 2016-07-22 DIAGNOSIS — E1149 Type 2 diabetes mellitus with other diabetic neurological complication: Secondary | ICD-10-CM | POA: Diagnosis not present

## 2016-07-22 DIAGNOSIS — I129 Hypertensive chronic kidney disease with stage 1 through stage 4 chronic kidney disease, or unspecified chronic kidney disease: Secondary | ICD-10-CM | POA: Diagnosis not present

## 2016-07-24 DIAGNOSIS — G4700 Insomnia, unspecified: Secondary | ICD-10-CM | POA: Diagnosis not present

## 2016-07-31 ENCOUNTER — Encounter: Payer: Self-pay | Admitting: Nurse Practitioner

## 2016-08-04 ENCOUNTER — Ambulatory Visit: Payer: Medicare Other | Admitting: Cardiology

## 2016-08-12 ENCOUNTER — Encounter: Payer: Self-pay | Admitting: Internal Medicine

## 2016-08-12 ENCOUNTER — Ambulatory Visit (INDEPENDENT_AMBULATORY_CARE_PROVIDER_SITE_OTHER): Payer: Medicare Other | Admitting: *Deleted

## 2016-08-12 ENCOUNTER — Encounter (INDEPENDENT_AMBULATORY_CARE_PROVIDER_SITE_OTHER): Payer: Self-pay

## 2016-08-12 ENCOUNTER — Ambulatory Visit (INDEPENDENT_AMBULATORY_CARE_PROVIDER_SITE_OTHER): Payer: Medicare Other | Admitting: Internal Medicine

## 2016-08-12 VITALS — BP 114/76 | HR 87 | Ht 63.0 in | Wt 98.4 lb

## 2016-08-12 DIAGNOSIS — Z950 Presence of cardiac pacemaker: Secondary | ICD-10-CM | POA: Diagnosis not present

## 2016-08-12 DIAGNOSIS — I4891 Unspecified atrial fibrillation: Secondary | ICD-10-CM | POA: Diagnosis not present

## 2016-08-12 DIAGNOSIS — I480 Paroxysmal atrial fibrillation: Secondary | ICD-10-CM

## 2016-08-12 DIAGNOSIS — I442 Atrioventricular block, complete: Secondary | ICD-10-CM

## 2016-08-12 DIAGNOSIS — Z5181 Encounter for therapeutic drug level monitoring: Secondary | ICD-10-CM

## 2016-08-12 LAB — CUP PACEART INCLINIC DEVICE CHECK
Battery Impedance: 184 Ohm
Battery Remaining Longevity: 124 mo
Battery Voltage: 2.78 V
Brady Statistic RV Percent Paced: 100 %
Date Time Interrogation Session: 20180327142546
Implantable Lead Implant Date: 20080416
Implantable Lead Implant Date: 20080416
Implantable Lead Location: 753859
Implantable Lead Location: 753860
Implantable Lead Model: 4076
Implantable Lead Model: 5076
Implantable Pulse Generator Implant Date: 20150302
Lead Channel Impedance Value: 501 Ohm
Lead Channel Impedance Value: 67 Ohm
Lead Channel Pacing Threshold Amplitude: 0.5 V
Lead Channel Pacing Threshold Amplitude: 0.5 V
Lead Channel Pacing Threshold Pulse Width: 0.4 ms
Lead Channel Pacing Threshold Pulse Width: 0.4 ms
Lead Channel Setting Pacing Amplitude: 2.5 V
Lead Channel Setting Pacing Pulse Width: 0.4 ms
Lead Channel Setting Sensing Sensitivity: 5.6 mV

## 2016-08-12 LAB — POCT INR: INR: 2.6

## 2016-08-12 MED ORDER — TELMISARTAN 40 MG PO TABS: 40.0000 mg | ORAL_TABLET | Freq: Every day | ORAL | 11 refills | Status: DC

## 2016-08-12 NOTE — Progress Notes (Signed)
Patient Care Team: Antony Contras, MD as PCP - General (Family Medicine)   HPI  [name] is a 83 y.o. female Seen for pacemaker followup This had been undertaken for tachybradycardia syndrome. 2012 she underwent AV junction ablation by Dr. Lovena Le.  Thromboembolic risk profile includes prior TIA-2, hypertension-1, diabetes-1, gender-1, age-51 for a CHADS-VASc score of 7  She has been on coumadin with prior discussions re NOAC   She continues to smoke.  She has dizziness with standing and bending over    Past Medical History:  Diagnosis Date  . Atrial fibrillation ([place]) 02/24/2013  . Depression   . Diabetes mellitus without complication ([place])   . Headache   . Hypertension   . Osteopenia   . Pacemaker   . TIA (transient ischemic attack)     Past Surgical History:  Procedure Laterality Date  . ABLATION  2012   AVN ablation by Dr Lovena Le  . cateracts     eyes  . CYST REMOVAL TRUNK     from breast  . PACEMAKER INSERTION    . PERMANENT PACEMAKER GENERATOR CHANGE N/A 07/18/2013   Procedure: PERMANENT PACEMAKER GENERATOR CHANGE;  Surgeon: Deboraha Sprang, MD;  Location: [facility] CATH LAB;  Service: Cardiovascular;  Laterality: N/A;    Current Outpatient Prescriptions  Medication Sig Dispense Refill  . escitalopram (LEXAPRO) 20 MG tablet Take 20 mg by mouth daily.  0  . hydrochlorothiazide (HYDRODIURIL) 12.5 MG tablet Take 0.5 tablets (6.25 mg total) by mouth daily. 45 tablet 3  . Insulin Glargine (TOUJEO SOLOSTAR) 300 UNIT/ML SOPN Inject 4 Units/day into the skin.    Marland Kitchen LORazepam (ATIVAN) 1 MG tablet Take 1 mg by mouth at bedtime as needed for anxiety or sleep.     . Melatonin 5 MG TABS Take 5 mg by mouth daily.    . Multiple Vitamin (MULTIVITAMIN) tablet Take 1 tablet by mouth daily.    . pregabalin (LYRICA) 50 MG capsule Take 1 capsule (50 mg total) by mouth daily. 30 capsule 5  . telmisartan (MICARDIS) 80 MG tablet Take 80 mg by mouth daily.    Marland Kitchen warfarin (COUMADIN) 1 MG  tablet take as directed BY COUMADIN CLINIC 60 tablet 3   No current facility-administered medications for this visit.     Allergies  Allergen Reactions  . Eliquis [Apixaban] Other (See Comments)    Made pt have weak muscles  . Gabapentin     SHAKES AND NAUSEA   . Rozerem [Ramelteon]     DIZZY AND HEAD THROBBING  . Temazepam     INSOMNIA  . Tikosyn [Dofetilide]     Mouth swells, itching  . Tradjenta [Linagliptin] Diarrhea    Joint Pain, Stomach issues    Review of Systems negative except from HPI and PMH  Physical Exam BP 114/76 (BP Location: Left Arm, Patient Position: Sitting, Cuff Size: Normal)   Pulse 87   Ht 5\' 3"  (1.6 m)   Wt 98 lb 6.4 oz (44.6 kg)   SpO2 96%   BMI 17.43 kg/m  Well developed and cachectic  in no acute distress HENT normal E scleral and icterus clear Neck Supple JVP flat; carotids brisk and full Clear to ausculation  Regular rate and rhythm, no murmurs gallops or rub Soft with active bowel sounds No clubbing cyanosis no  Edema Alert and oriented, grossly normal motor and sensory function Skin Warm and Dry  ECG personally reviewed  Afib 70 -/14/44  Assessment and  Plan  Atrial fibrillation  Complete heart block s/p AV ablation  Dizziness  Orthostatic  Hypertension   Pacemaker Medtronic  The patient's device was interrogated.  The information was reviewed. No changes were made in the programming.    On Anticoagulation;  No bleeding issues   Suspect dizziness is related to hypotension;  Will decrease micardis  BP is a little lw

## 2016-08-12 NOTE — Patient Instructions (Addendum)
Medication Instructions: - Your physician has recommended you make the following change in your medication:  1) Decrease micardis (telmisartan) to 40 mg- take one tablet by mouth once daily  Labwork: - none ordered  Procedures/Testing: - none ordered  Follow-Up: - Remote monitoring is used to monitor your Pacemaker of ICD from home. This monitoring reduces the number of office visits required to check your device to one time per year. It allows [place] to keep an eye on the functioning of your device to ensure it is working properly. You are scheduled for a device check from home on 11/11/16. You may send your transmission at any time that day. If you have a wireless device, the transmission will be sent automatically. After your physician reviews your transmission, you will receive a postcard with your next transmission date.  - Your physician wants you to follow-up in: 1 year with Dr. Caryl Comes. You will receive a reminder letter in the mail two months in advance. If you don't receive a letter, please call our office to schedule the follow-up appointment.  Any Additional Special Instructions Will Be Listed Below (If Applicable).     If you need a refill on your cardiac medications before your next appointment, please call your pharmacy.

## 2016-08-14 ENCOUNTER — Ambulatory Visit (INDEPENDENT_AMBULATORY_CARE_PROVIDER_SITE_OTHER): Payer: Medicare Other | Admitting: Cardiology

## 2016-08-14 ENCOUNTER — Encounter: Payer: Self-pay | Admitting: Cardiology

## 2016-08-14 VITALS — BP 96/50 | HR 86 | Ht 63.0 in | Wt 96.6 lb

## 2016-08-14 DIAGNOSIS — I951 Orthostatic hypotension: Secondary | ICD-10-CM

## 2016-08-14 DIAGNOSIS — Z950 Presence of cardiac pacemaker: Secondary | ICD-10-CM

## 2016-08-14 DIAGNOSIS — I481 Persistent atrial fibrillation: Secondary | ICD-10-CM | POA: Diagnosis not present

## 2016-08-14 DIAGNOSIS — I4819 Other persistent atrial fibrillation: Secondary | ICD-10-CM

## 2016-08-14 NOTE — Patient Instructions (Signed)
Medication Instructions:  Please stop you HCTZ. Continue all other medications as listed.  Follow-Up: Follow up in 6 months with Bonney Leitz, PA.  You will receive a letter in the mail 2 months before you are due.  Please call us when you receive this letter to schedule your follow up appointment.  If you need a refill on your cardiac medications before your next appointment, please call your pharmacy.  Thank you for choosing [facility]!!

## 2016-08-14 NOTE — Progress Notes (Signed)
[facility]. [address], [place]  [address] Phone: [phone] Fax:  [phone]  Date:  08/14/2016   ID:  [name], DOB 03-Apr-1929, MRN [id number]  PCP:  Gara Kroner, MD   History of Present Illness: [name] is a 83 y.o. female with history of permanent atrial fibrillation, status post AV nodal ablation in 2010 for persistent symptoms. Diabetes, EF 45% likely from chronic pacing. Septal wall hypokinesis noted a nuclear stress test which actually showed an EF of 64%. Her pacemaker has been functioning normally. Underlying atrial fibrillation noted.  Had joint pain from Trajenta.  Still continues to smoke. Cough. Allergies. No fevers noted.  No fevers.  She did not like taking Eliquis. Would rather stay on Coumadin. This is fine. Her levels have been stable.  Her main complaint seems to be fatigued, dizziness, headaches since August. She has been discussing this with Dr. Moreen Fowler. She is not noticed any blood loss. She is on anticoagulation, Coumadin. No chest pain, no significant change in shortness of breath.  Had shingles before Christmas. Insomnia. Saw sleep clinic.    Wt Readings from Last 3 Encounters:  08/14/16 96 lb 9.6 oz (43.8 kg)  08/12/16 98 lb 6.4 oz (44.6 kg)  07/01/16 94 lb (42.6 kg)     Past Medical History:  Diagnosis Date  . Atrial fibrillation ([place]) 02/24/2013  . Depression   . Diabetes mellitus without complication ([place])   . Headache   . Hypertension   . Osteopenia   . Pacemaker   . TIA (transient ischemic attack)     Past Surgical History:  Procedure Laterality Date  . ABLATION  2012   AVN ablation by Dr Lovena Le  . cateracts     eyes  . CYST REMOVAL TRUNK     from breast  . PACEMAKER INSERTION    . PERMANENT PACEMAKER GENERATOR CHANGE N/A 07/18/2013   Procedure: PERMANENT PACEMAKER GENERATOR CHANGE;  Surgeon: Deboraha Sprang, MD;  Location: [facility] CATH LAB;  Service: Cardiovascular;  Laterality: N/A;    Current Outpatient  Prescriptions  Medication Sig Dispense Refill  . escitalopram (LEXAPRO) 20 MG tablet Take 20 mg by mouth daily.  0  . Insulin Glargine (TOUJEO SOLOSTAR) 300 UNIT/ML SOPN Inject 4 Units/day into the skin.    Marland Kitchen LORazepam (ATIVAN) 1 MG tablet Take 1 mg by mouth at bedtime as needed for anxiety or sleep.     . Melatonin 5 MG TABS Take 5 mg by mouth daily.    . Multiple Vitamin (MULTIVITAMIN) tablet Take 1 tablet by mouth daily.    . pregabalin (LYRICA) 50 MG capsule Take 1 capsule (50 mg total) by mouth daily. 30 capsule 5  . telmisartan (MICARDIS) 40 MG tablet Take 1 tablet (40 mg total) by mouth daily. 30 tablet 11  . warfarin (COUMADIN) 1 MG tablet take as directed BY COUMADIN CLINIC 60 tablet 3   No current facility-administered medications for this visit.     Allergies:    Allergies  Allergen Reactions  . Eliquis [Apixaban] Other (See Comments)    Made pt have weak muscles  . Gabapentin     SHAKES AND NAUSEA   . Rozerem [Ramelteon]     DIZZY AND HEAD THROBBING  . Temazepam     INSOMNIA  . Tikosyn [Dofetilide]     Mouth swells, itching  . Tradjenta [Linagliptin] Diarrhea    Joint Pain, Stomach issues    Social History:  The patient  reports that she has been smoking.  She has never used smokeless tobacco. She reports that she does not drink alcohol or use drugs.   ROS:  Please see the history of present illness.   Denies any bleeding, syncope, new strokelike symptoms. No dizziness, no chest pain. Positive for cough, occasional depression, snoring, anxiety, leg pain, excessive fatigue.   PHYSICAL EXAM: VS:  BP (!) 96/50   Pulse 86   Ht 5\' 3"  (1.6 m)   Wt 96 lb 9.6 oz (43.8 kg)   BMI 17.11 kg/m  Thin, in no acute distress  HEENT: normal  Neck: no JVD  Cardiac:  normal S1, S2; RRR; no murmur  Lungs:  No significant wheezing, normal respiratory effort.  Abd: soft, nontender, no hepatomegaly  Ext: no edema  Skin: warm and dry  Neuro: no focal abnormalities noted  EKG:   Atrial fibrillation rate 71, ventricular pacing.     Echocardiogram: 08/08/14-normal ejection fraction, there was an oscillating density on pacemaker lead.  ASSESSMENT AND PLAN:  1. Atrial fibrillation- CHADS-VASc 5. Did not like taking Eliquis. Continuing to take Coumadin. She had an AV nodal ablation. Pacemaker working well.  2. Essential hypertension-just decreased micardis. Will stop HCTZ. blood pressure too low. 3. Pacemaker-functioning well, Dr. Olin Pia note reviewed.  4. Protein calorie malnutrition-BMI 17. [employer] Instant Breakfast. 5. Tobacco use-continue to encourage tobacco cessation. She does not wish to quit. 6. COPD/bronchitis-continuing to be managed by Dr. Moreen Fowler. Currently stable.  7. AV block/AV nodal ablation-permanent pacemaker required. Pacemaker dependent. Dr. Caryl Comes watching. No changes. 8. Six-month follow-up.   Signed, Candee Furbish, MD [facility]  08/14/2016 4:18 PM

## 2016-08-25 ENCOUNTER — Telehealth: Payer: Self-pay | Admitting: Internal Medicine

## 2016-08-25 MED ORDER — TELMISARTAN 80 MG PO TABS
80.0000 mg | ORAL_TABLET | Freq: Every day | ORAL | 6 refills | Status: DC
Start: 2016-08-25 — End: 2017-09-21

## 2016-08-25 NOTE — Telephone Encounter (Signed)
Will forward to Dr. Marlou Porch to review (primary cardiologist). Patient saw Dr. Caryl Comes on 08/12/16 and micardis was cut in 1/2 due to low BP. Patient saw Dr. Marlou Porch on 08/14/16 and HCTZ was stopped due to low BP.

## 2016-08-25 NOTE — Telephone Encounter (Signed)
Spoke with pt.  She has been on Micardis 40 mg and will increase back to 80 mg a day.  She will continue to monitor her BP and c/b with any further questions or concerns.

## 2016-08-25 NOTE — Telephone Encounter (Signed)
New Message    Pt c/o BP issue: STAT if pt c/o blurred vision, one-sided weakness or slurred speech  1. What are your last 5 BP readings? 162/79, 161/85 169/86 154/92  2. Are you having any other symptoms (ex. Dizziness, headache, blurred vision, passed out)? Headache, Dizziness  3. What is your BP issue? Per pt BP has gone up since being taking off medication. Requesting call back from nurse

## 2016-08-25 NOTE — Telephone Encounter (Signed)
Labile blood pressures. Let's try once again one half dose of micardis so that would be 20 mg. Let us know how you do.  Candee Furbish, MD

## 2016-08-27 ENCOUNTER — Telehealth: Payer: Self-pay | Admitting: Cardiology

## 2016-08-27 NOTE — Telephone Encounter (Signed)
Mrs. Baugh is calling because she has been having some rapid heartbeats and some dizzy spells. Please call. Please call .Marland Kitchen  Thanks

## 2016-08-27 NOTE — Telephone Encounter (Signed)
Pt calling today because she has been having a rapid heartbeat recently and Dr Caryl Comes had told her he can "fix that" for her.  She has also been having some dizziness but has not checked her BP and doesn't know what her HR is.  She has been having episodes of weakness as well.  Advised I will check with the device clinic to see if she can send a transmission to she what her heart rate and rhythm have been.  She will check her BP in the meantime.

## 2016-08-27 NOTE — Telephone Encounter (Signed)
BP 162/81 per pt. She is aware to send a transmission and that someone will contact her afterwards.

## 2016-08-27 NOTE — Telephone Encounter (Signed)
Spoke with patient who reports "fast HR" and dizziness. She reports that it was mild and intermittent at her last OV with SK in march but it has gotten progressively worse and is now almost constant. She says that when she discussed this issue with SK at her OV he said he would "fix it" but that she told him it wasn't all that bad at the time. Her remote shows a presenting rhythm of afib. I explained that I could review this with SK tomorrow afternoon when he was in the office. She was agreeable.

## 2016-08-27 NOTE — Telephone Encounter (Signed)
Spoke w/ pt. She was having trouble sending a remote transmission. I walked pt through this over the phone. Informed her that the remote transmission was received and that someone will review it and call her back either today or tomorrow.

## 2016-08-28 ENCOUNTER — Telehealth: Payer: Self-pay

## 2016-08-28 NOTE — Telephone Encounter (Signed)
Reviewed with SK. Programing recommendations made. Spoke with patient and scheduled for a device clinic appointment 4/13. Patient was agreeable.

## 2016-08-29 ENCOUNTER — Ambulatory Visit: Payer: Medicare Other | Admitting: *Deleted

## 2016-08-29 VITALS — BP 140/78

## 2016-08-29 DIAGNOSIS — I4819 Other persistent atrial fibrillation: Secondary | ICD-10-CM

## 2016-08-29 LAB — CUP PACEART INCLINIC DEVICE CHECK
Date Time Interrogation Session: 20180413152129
Implantable Lead Implant Date: 20080416
Implantable Lead Implant Date: 20080416
Implantable Lead Location: 753859
Implantable Lead Location: 753860
Implantable Lead Model: 4076
Implantable Lead Model: 5076
Implantable Pulse Generator Implant Date: 20150302

## 2016-08-29 NOTE — Progress Notes (Signed)
Saw patient today due to c/o dizzines, a fast heart rate, weakness and pounding in her head. Previous remote reviewed with SK. Recommended changes included adjusting her ADL to 90, increasing her threshold to med/high and decreasing her slope to 1. These changes were made in clinic today. Walked with patient down the hall and back resulted in no changes - patient continued to c/o of head throbbing and weakness. Patient rested in chair for 52min and began to report feeling "better". We walked in the hall again in which she reported less fatigue, less head throbbing and no heart racing. I explained that it may take a day or two to fully see results from the changes made today. I asked her to call me on Monday and report how she was feeling at that time. Patient was agreeable to this. Device Clinic number given to patient.

## 2016-08-29 NOTE — Telephone Encounter (Signed)
Duplicate - see 2/98 phone note

## 2016-09-01 ENCOUNTER — Telehealth: Payer: Self-pay

## 2016-09-01 NOTE — Telephone Encounter (Signed)
Reviewed with SK. Re-scheduled patient to 3:30pm 4/17. Updated patient's husband.

## 2016-09-01 NOTE — Telephone Encounter (Signed)
Spoke with patient who called to repot that the rate response changes made on 4/13 did not help to improve her symptoms- she reports increased fatigue causing her to have to lie down most of the day as well as a headache. These symptoms were similar to her previous complaints but she states they have now worsened. Will schedule on the device clinic schedule during a day SK is in the office (4/17 at 1400) Patient is agreeable.

## 2016-09-02 ENCOUNTER — Ambulatory Visit (INDEPENDENT_AMBULATORY_CARE_PROVIDER_SITE_OTHER): Payer: Medicare Other | Admitting: Internal Medicine

## 2016-09-02 VITALS — BP 140/84

## 2016-09-02 DIAGNOSIS — R0609 Other forms of dyspnea: Secondary | ICD-10-CM

## 2016-09-02 DIAGNOSIS — R002 Palpitations: Secondary | ICD-10-CM | POA: Diagnosis not present

## 2016-09-02 DIAGNOSIS — R0600 Dyspnea, unspecified: Secondary | ICD-10-CM

## 2016-09-03 LAB — CUP PACEART INCLINIC DEVICE CHECK
Date Time Interrogation Session: 20180418134501
Implantable Lead Implant Date: 20080416
Implantable Lead Implant Date: 20080416
Implantable Lead Location: 753859
Implantable Lead Location: 753860
Implantable Lead Model: 4076
Implantable Lead Model: 5076
Implantable Pulse Generator Implant Date: 20150302

## 2016-09-03 NOTE — Progress Notes (Signed)
Patient seen in clinic with c/o that previous changes made her weakness and dizziness worse, however she reports that she no longer feels her fast heart rates. Orthostatic take - see vitals tab. Patient seen by SK with recommendations to wear spanks for assistance with blood pressure control and to call back in a week with an update on how she feels.        Patient Care Team: Antony Contras, MD as PCP - General (Family Medicine)   HPI  [name] is a 83 y.o. female Seen as an add-on because of ongoing problems with dyspnea on exertion but even more prominently dizziness. This dizziness is non-positional. It has been relatively steady. The previous concern of palpitations seem to be better with a decrease in her heart rate excursion/rate response of her device.     Past Medical History:  Diagnosis Date  . Atrial fibrillation ([place]) 02/24/2013  . Depression   . Diabetes mellitus without complication ([place])   . Headache   . Hypertension   . Osteopenia   . Pacemaker   . TIA (transient ischemic attack)     Past Surgical History:  Procedure Laterality Date  . ABLATION  2012   AVN ablation by Dr Lovena Le  . cateracts     eyes  . CYST REMOVAL TRUNK     from breast  . PACEMAKER INSERTION    . PERMANENT PACEMAKER GENERATOR CHANGE N/A 07/18/2013   Procedure: PERMANENT PACEMAKER GENERATOR CHANGE;  Surgeon: Deboraha Sprang, MD;  Location[facility] CATH LAB;  Service: Cardiovascular;  Laterality: N/A;    Current Outpatient Prescriptions  Medication Sig Dispense Refill  . escitalopram (LEXAPRO) 20 MG tablet Take 20 mg by mouth daily.  0  . Insulin Glargine (TOUJEO SOLOSTAR) 300 UNIT/ML SOPN Inject 4 Units/day into the skin.    Marland Kitchen LORazepam (ATIVAN) 1 MG tablet Take 1 mg by mouth at bedtime as needed for anxiety or sleep.     . Melatonin 5 MG TABS Take 5 mg by mouth daily.    . Multiple Vitamin (MULTIVITAMIN) tablet Take 1 tablet by mouth daily.    . pregabalin (LYRICA) 50 MG capsule Take 1  capsule (50 mg total) by mouth daily. 30 capsule 5  . telmisartan (MICARDIS) 80 MG tablet Take 1 tablet (80 mg total) by mouth daily. 30 tablet 6  . warfarin (COUMADIN) 1 MG tablet take as directed BY COUMADIN CLINIC 60 tablet 3   No current facility-administered medications for this visit.     Allergies  Allergen Reactions  . Eliquis [Apixaban] Other (See Comments)    Made pt have weak muscles  . Gabapentin     SHAKES AND NAUSEA   . Rozerem [Ramelteon]     DIZZY AND HEAD THROBBING  . Temazepam     INSOMNIA  . Tikosyn [Dofetilide]     Mouth swells, itching  . Tradjenta [Linagliptin] Diarrhea    Joint Pain, Stomach issues      Review of Systems negative except from HPI and PMH  Physical Exam BP 140/84 (Patient Position: Standing)  Well developed and well nourished in no acute distress HENT normal E scleral and icterus clear Neck Supple JVP flat; carotids brisk and full Clear to ausculation  Regular rate and rhythm, no murmurs gallops or rub Soft with active bowel sounds No clubbing cyanosis  Edema Alert and oriented, grossly normal motor and sensory function Skin Warm and Dry    Assessment and  Plan  Atrial fibrillation  Complete  heart block s/p AV ablation  Dizziness  Orthostatic  Hypertension    Trying to avoid medications, we will try compressive closely for orthostasis.  I worry in terms of the dizziness and is not positional nature whether is in fact central.  As she is on warfarin with good control no adjunctive medications would be added although her INR target might be increased from 2-3---2.5-3.5  She will let us know next week or 2 how she's feeling.    Current medicines are reviewed at length with the patient today .  The patient does not  have concerns regarding medicines.

## 2016-09-08 ENCOUNTER — Telehealth: Payer: Self-pay | Admitting: Cardiology

## 2016-09-08 NOTE — Telephone Encounter (Signed)
Pt called back to let [facility] RN know that she is feeling a little better today. She isn't as weak, but she still feels a little woozy headed and unstable. She wanted to know if MD would call her in something for a head cold. I informed her that she would need to contact her PCP for medicine to treat the head cold b/c Cardiac MD will only provided medicines related to there her heart. Pt verbalized understanding and just wanted Device Clinic RN that she was feeling a little better.

## 2016-09-22 ENCOUNTER — Ambulatory Visit (INDEPENDENT_AMBULATORY_CARE_PROVIDER_SITE_OTHER): Payer: Medicare Other

## 2016-09-22 DIAGNOSIS — Z5181 Encounter for therapeutic drug level monitoring: Secondary | ICD-10-CM | POA: Diagnosis not present

## 2016-09-22 DIAGNOSIS — I4891 Unspecified atrial fibrillation: Secondary | ICD-10-CM

## 2016-09-22 LAB — POCT INR: INR: 2.6

## 2016-09-22 MED ORDER — WARFARIN SODIUM 1 MG PO TABS: ORAL_TABLET | ORAL | 3 refills | Status: DC

## 2016-09-24 DIAGNOSIS — G4700 Insomnia, unspecified: Secondary | ICD-10-CM | POA: Diagnosis not present

## 2016-09-30 ENCOUNTER — Ambulatory Visit (INDEPENDENT_AMBULATORY_CARE_PROVIDER_SITE_OTHER): Payer: Medicare Other | Admitting: Podiatry

## 2016-09-30 ENCOUNTER — Encounter: Payer: Self-pay | Admitting: Podiatry

## 2016-09-30 DIAGNOSIS — E1142 Type 2 diabetes mellitus with diabetic polyneuropathy: Secondary | ICD-10-CM | POA: Diagnosis not present

## 2016-09-30 DIAGNOSIS — B351 Tinea unguium: Secondary | ICD-10-CM

## 2016-09-30 DIAGNOSIS — M79676 Pain in unspecified toe(s): Secondary | ICD-10-CM

## 2016-09-30 NOTE — Progress Notes (Signed)
Patient ID: [name], female   DOB: November 10, 1928, 83 y.o.   MRN: [id number] Complaint:  Visit Type: Patient returns to my office for continued preventative foot care services. Complaint: Patient states" my nails have grown long and thick and become painful to walk and wear shoes" Patient has been diagnosed with DM with no complications. He presents for preventative foot care services. No changes to ROS  Podiatric Exam: Vascular: dorsalis pedis and posterior tibial pulses are palpable bilateral. Capillary return is immediate. Temperature gradient is WNL. Skin turgor WNL  Sensorium: Normal Semmes Weinstein monofilament test. Normal tactile sensation bilaterally. Nail Exam: Pt has thick disfigured discolored nails with subungual debris noted bilateral entire nail hallux through fifth toenails Ulcer Exam: There is no evidence of ulcer or pre-ulcerative changes or infection. Orthopedic Exam: Muscle tone and strength are WNL. No limitations in general ROM. No crepitus or effusions noted. Foot type and digits show no abnormalities. Bony prominences are unremarkable. Skin: No Porokeratosis. No infection or ulcers  Diagnosis:  Tinea unguium, Pain in right toe, pain in left toes  Treatment & Plan Procedures and Treatment: Consent by patient was obtained for treatment procedures. The patient understood the discussion of treatment and procedures well. All questions were answered thoroughly reviewed. Debridement of mycotic and hypertrophic toenails, 1 through 5 bilateral and clearing of subungual debris. No ulceration, no infection noted.    Return Visit-Office Procedure: Patient instructed to return to the office for a follow up visit 3 months for continued evaluation and treatment.    [name] DPM

## 2016-10-06 ENCOUNTER — Other Ambulatory Visit: Payer: Self-pay | Admitting: Cardiology

## 2016-10-27 ENCOUNTER — Other Ambulatory Visit: Payer: Self-pay | Admitting: Podiatry

## 2016-10-27 DIAGNOSIS — F411 Generalized anxiety disorder: Secondary | ICD-10-CM | POA: Diagnosis not present

## 2016-10-27 DIAGNOSIS — R42 Dizziness and giddiness: Secondary | ICD-10-CM | POA: Diagnosis not present

## 2016-10-27 DIAGNOSIS — G2581 Restless legs syndrome: Secondary | ICD-10-CM | POA: Diagnosis not present

## 2016-10-27 DIAGNOSIS — Z23 Encounter for immunization: Secondary | ICD-10-CM | POA: Diagnosis not present

## 2016-10-27 MED ORDER — PREGABALIN 50 MG PO CAPS: 50.0000 mg | ORAL_CAPSULE | Freq: Every day | ORAL | 5 refills | Status: DC

## 2016-10-27 NOTE — Telephone Encounter (Signed)
Pt called needs rx refill on her lyrica sent to [facility]

## 2016-10-27 NOTE — Telephone Encounter (Signed)
Lyrica faxed to [facility]. I informed pt the Lyrica rx had been faxed to her pharmacy.

## 2016-10-31 ENCOUNTER — Ambulatory Visit (INDEPENDENT_AMBULATORY_CARE_PROVIDER_SITE_OTHER): Payer: Medicare Other | Admitting: *Deleted

## 2016-10-31 ENCOUNTER — Encounter (INDEPENDENT_AMBULATORY_CARE_PROVIDER_SITE_OTHER): Payer: Self-pay

## 2016-10-31 DIAGNOSIS — I4891 Unspecified atrial fibrillation: Secondary | ICD-10-CM | POA: Diagnosis not present

## 2016-10-31 DIAGNOSIS — Z5181 Encounter for therapeutic drug level monitoring: Secondary | ICD-10-CM | POA: Diagnosis not present

## 2016-10-31 LAB — POCT INR: INR: 2.6

## 2016-11-07 IMAGING — CT CT HEAD W/O CM
4 series · 16 of 47 positions shown, 18 images · non-contrast
Comparison: CT HEAD October 14, 2006

CLINICAL DATA: Dizziness for 3 days. Tripped and fell resulting in
head injury 2 weeks ago, on Coumadin. History of atrial
fibrillation, hypertension and diabetes.

EXAM:
CT HEAD WITHOUT CONTRAST
TECHNIQUE: Contiguous axial images were obtained from the base of the skull
through the vertex without intravenous contrast.

[Series 2: head without · axial · non-contrast · 0.40mm/px · z∈[-109,+1]mm · 7 of 30 slices shown, 9 images]
[im 4/30  brain]
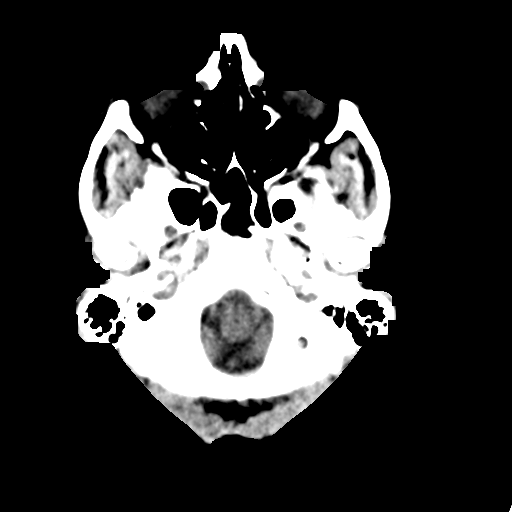
[im 4/30  bone]
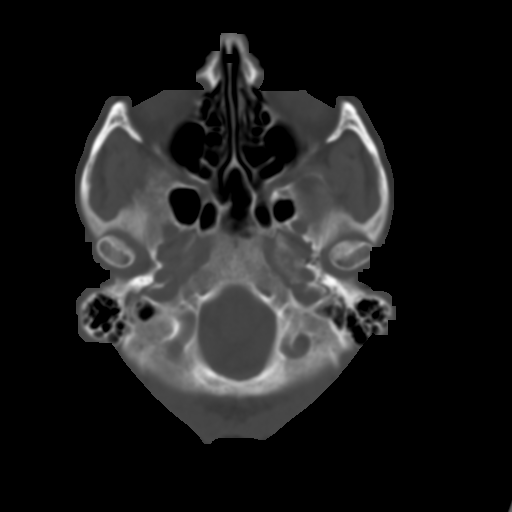
[im 8/30  brain]
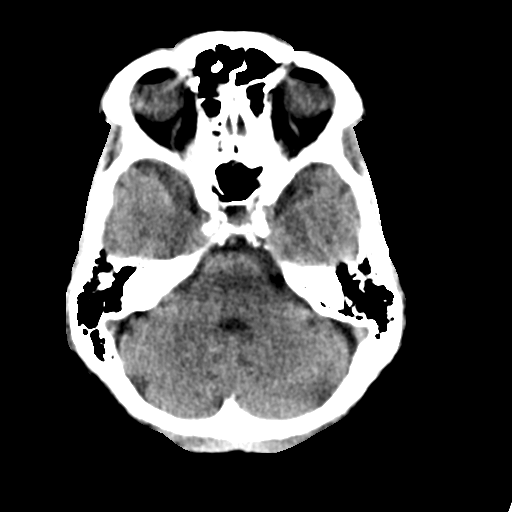
[im 11/30  brain]
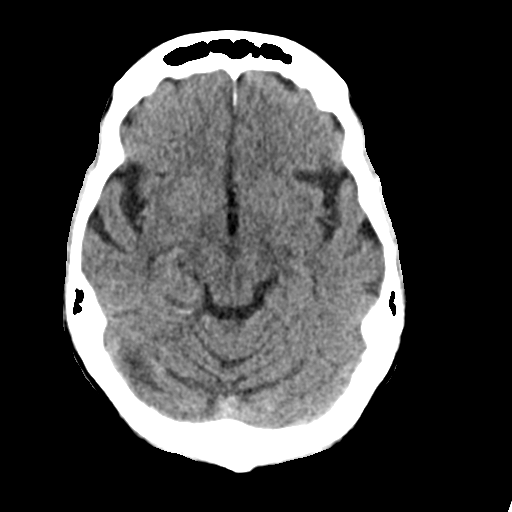
[im 15/30  brain]
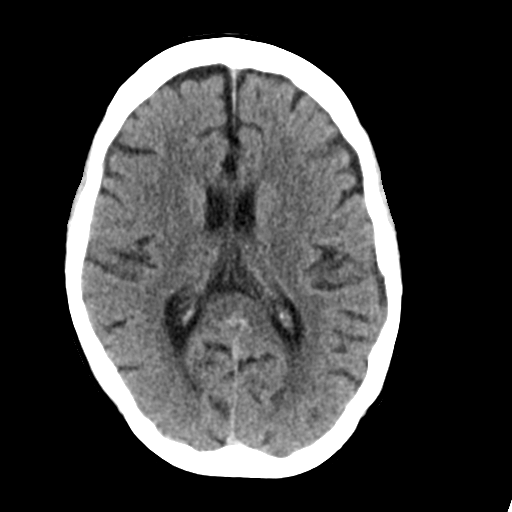
[im 19/30  brain]
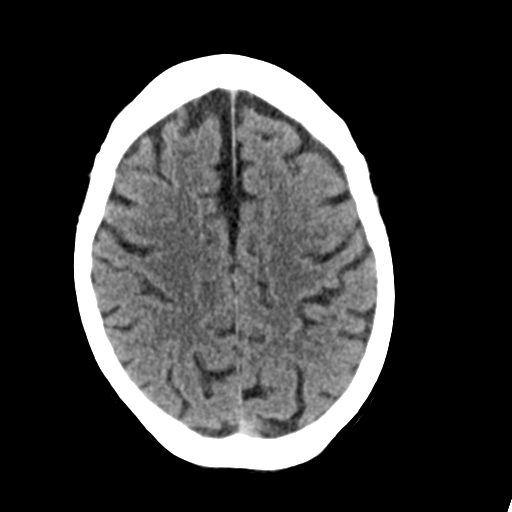
[im 19/30  bone]
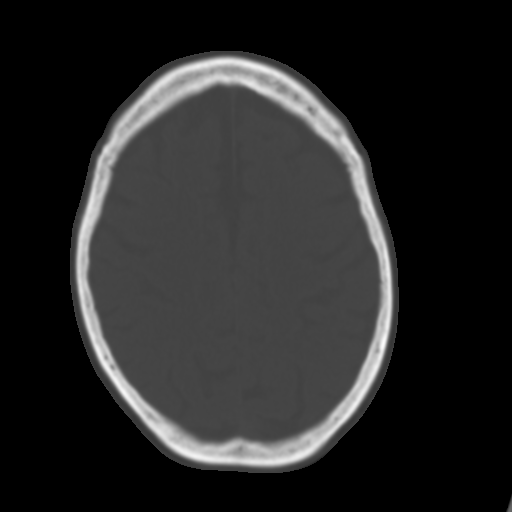
[im 22/30  brain]
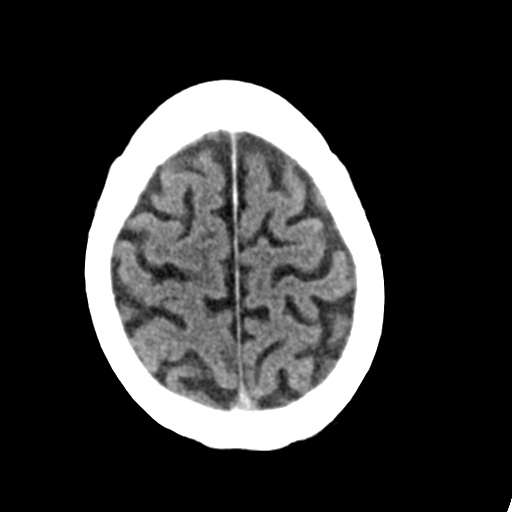
[im 26/30  brain]
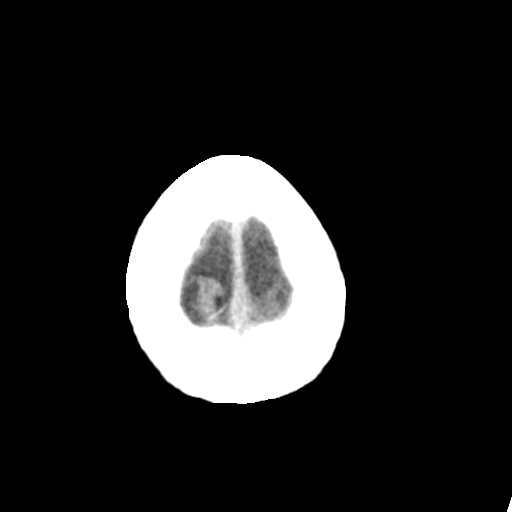

[Series 3: head bone · axial · 0.40mm/px · z∈[-110,-82]mm · 3 of 74 slices shown]
[im 8/74  bone]
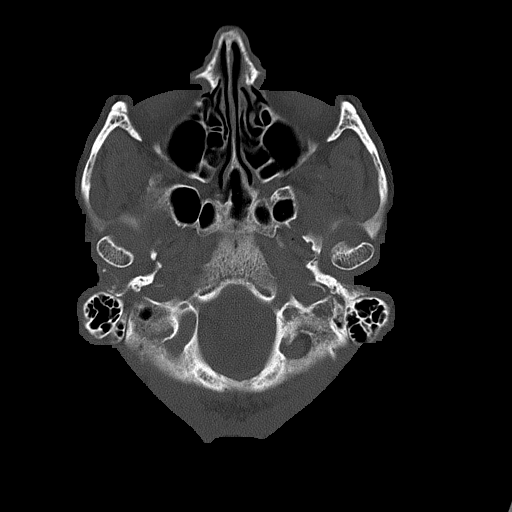
[im 15/74  bone]
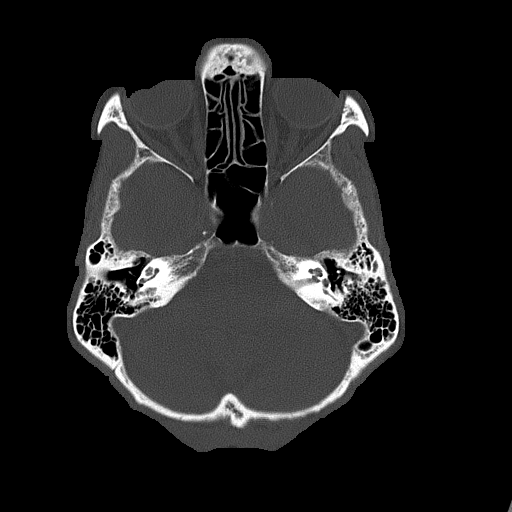
[im 22/74  bone]
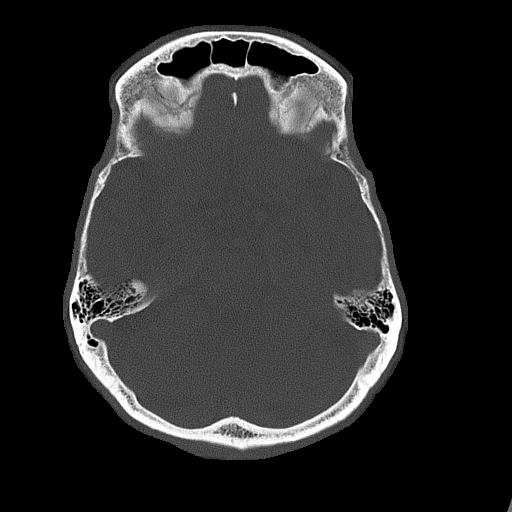

[Series 6: head without sag · sagittal · non-contrast · 0.28mm/px · 3 of 49 slices shown]
[im 17/49  brain]
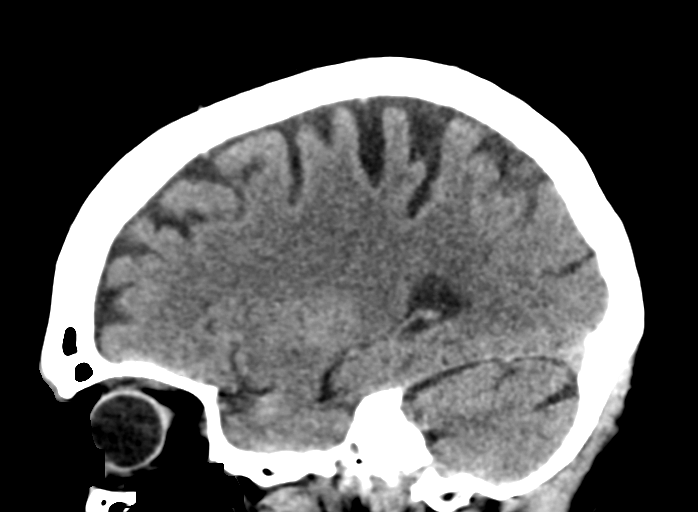
[im 25/49  brain]
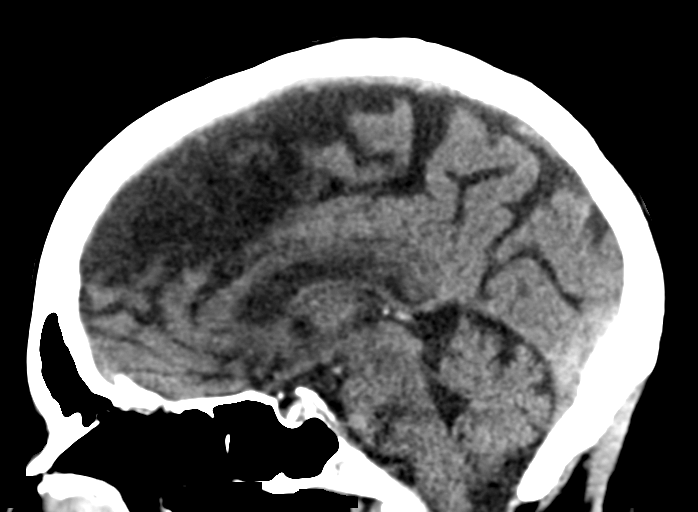
[im 33/49  brain]
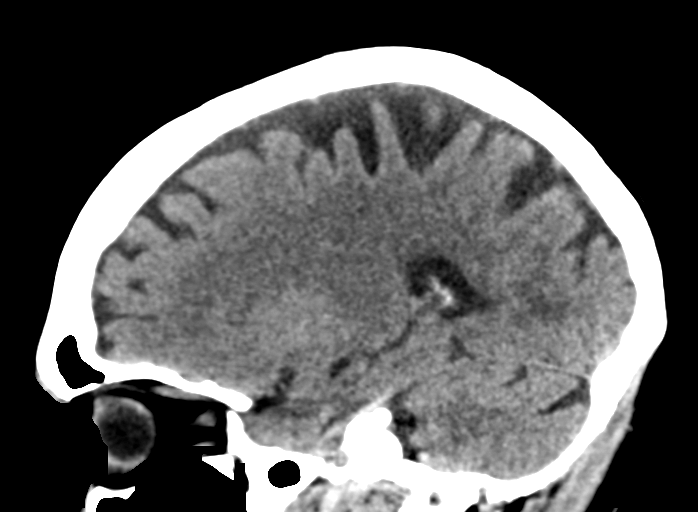

[Series 7: head without cor · coronal · non-contrast · 0.29mm/px · 3 of 64 slices shown]
[im 22/64  brain]
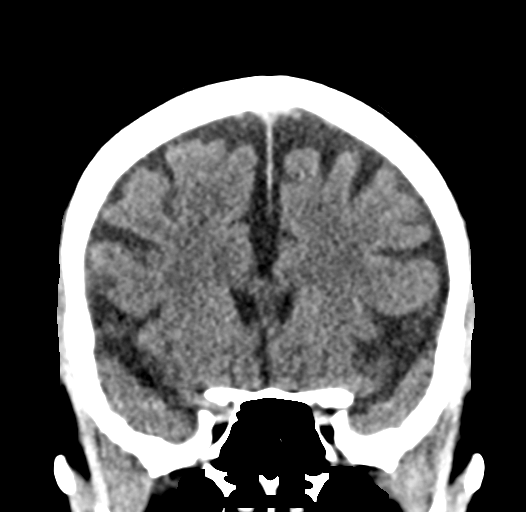
[im 29/64  brain]
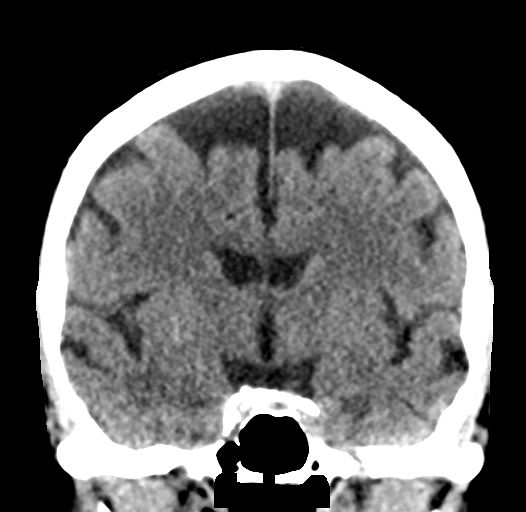
[im 36/64  brain]
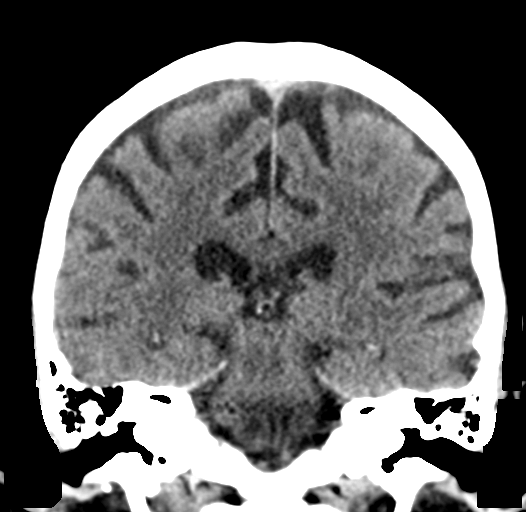

[16 of 47 positions shown; findings below may reference images not displayed]

FINDINGS: BRAIN: The ventricles and sulci are normal for age. No
intraparenchymal hemorrhage, mass effect nor midline shift. Minimal
supratentorial white matter hypodensities less than expected for
patient's age, though non-specific are most compatible with chronic
small vessel ischemic disease. No acute large vascular territory
infarcts. No abnormal extra-axial fluid collections. Basal cisterns
are patent.

VASCULAR: Moderate calcific atherosclerosis of the carotid siphons.

SKULL: No skull fracture. No significant scalp soft tissue swelling.

SINUSES/ORBITS: The included ocular globes and orbital contents are
non-suspicious. Status post bilateral ocular lens implants. The
mastoid air-cells and included paranasal sinuses are well-aerated.

OTHER: None.
IMPRESSION: Normal noncontrast CT HEAD for age.

## 2016-11-11 ENCOUNTER — Telehealth: Payer: Self-pay | Admitting: Cardiology

## 2016-11-11 ENCOUNTER — Encounter: Payer: Medicare Other | Admitting: *Deleted

## 2016-11-11 NOTE — Telephone Encounter (Signed)
Spoke with pt and reminded pt of remote transmission that is due today. Pt verbalized understanding.   

## 2016-11-17 ENCOUNTER — Ambulatory Visit (INDEPENDENT_AMBULATORY_CARE_PROVIDER_SITE_OTHER): Payer: Medicare Other | Admitting: *Deleted

## 2016-11-17 DIAGNOSIS — I442 Atrioventricular block, complete: Secondary | ICD-10-CM

## 2016-11-17 NOTE — Progress Notes (Signed)
Remote pacemaker transmission.   

## 2016-11-18 ENCOUNTER — Encounter: Payer: Self-pay | Admitting: Cardiology

## 2016-11-18 LAB — CUP PACEART REMOTE DEVICE CHECK
Battery Impedance: 233 Ohm
Battery Remaining Longevity: 114 mo
Battery Voltage: 2.78 V
Brady Statistic RV Percent Paced: 100 %
Date Time Interrogation Session: 20180702133219
Implantable Lead Implant Date: 20080416
Implantable Lead Implant Date: 20080416
Implantable Lead Location: 753859
Implantable Lead Location: 753860
Implantable Lead Model: 4076
Implantable Lead Model: 5076
Implantable Pulse Generator Implant Date: 20150302
Lead Channel Impedance Value: 519 Ohm
Lead Channel Impedance Value: 67 Ohm
Lead Channel Pacing Threshold Amplitude: 0.75 V
Lead Channel Pacing Threshold Pulse Width: 0.4 ms
Lead Channel Setting Pacing Amplitude: 2.5 V
Lead Channel Setting Pacing Pulse Width: 0.4 ms
Lead Channel Setting Sensing Sensitivity: 5.6 mV

## 2016-11-20 DIAGNOSIS — T63441A Toxic effect of venom of bees, accidental (unintentional), initial encounter: Secondary | ICD-10-CM | POA: Diagnosis not present

## 2016-11-20 DIAGNOSIS — F329 Major depressive disorder, single episode, unspecified: Secondary | ICD-10-CM | POA: Diagnosis not present

## 2016-11-20 DIAGNOSIS — T63483A Toxic effect of venom of other arthropod, assault, initial encounter: Secondary | ICD-10-CM | POA: Diagnosis not present

## 2016-11-20 DIAGNOSIS — L989 Disorder of the skin and subcutaneous tissue, unspecified: Secondary | ICD-10-CM | POA: Diagnosis not present

## 2016-12-01 DIAGNOSIS — F411 Generalized anxiety disorder: Secondary | ICD-10-CM | POA: Diagnosis not present

## 2016-12-01 DIAGNOSIS — Z0000 Encounter for general adult medical examination without abnormal findings: Secondary | ICD-10-CM | POA: Diagnosis not present

## 2016-12-01 DIAGNOSIS — I129 Hypertensive chronic kidney disease with stage 1 through stage 4 chronic kidney disease, or unspecified chronic kidney disease: Secondary | ICD-10-CM | POA: Diagnosis not present

## 2016-12-01 DIAGNOSIS — E1149 Type 2 diabetes mellitus with other diabetic neurological complication: Secondary | ICD-10-CM | POA: Diagnosis not present

## 2016-12-08 ENCOUNTER — Ambulatory Visit (INDEPENDENT_AMBULATORY_CARE_PROVIDER_SITE_OTHER): Payer: Medicare Other | Admitting: *Deleted

## 2016-12-08 DIAGNOSIS — I4891 Unspecified atrial fibrillation: Secondary | ICD-10-CM | POA: Diagnosis not present

## 2016-12-08 DIAGNOSIS — Z5181 Encounter for therapeutic drug level monitoring: Secondary | ICD-10-CM | POA: Diagnosis not present

## 2016-12-08 LAB — POCT INR: INR: 3.2

## 2016-12-24 ENCOUNTER — Ambulatory Visit: Payer: Medicare Other | Admitting: Diagnostic Neuroimaging

## 2016-12-31 ENCOUNTER — Ambulatory Visit (INDEPENDENT_AMBULATORY_CARE_PROVIDER_SITE_OTHER): Payer: Medicare Other | Admitting: Podiatry

## 2016-12-31 ENCOUNTER — Encounter: Payer: Self-pay | Admitting: Podiatry

## 2016-12-31 DIAGNOSIS — E1142 Type 2 diabetes mellitus with diabetic polyneuropathy: Secondary | ICD-10-CM | POA: Diagnosis not present

## 2016-12-31 DIAGNOSIS — B351 Tinea unguium: Secondary | ICD-10-CM | POA: Diagnosis not present

## 2016-12-31 DIAGNOSIS — M79676 Pain in unspecified toe(s): Secondary | ICD-10-CM

## 2016-12-31 NOTE — Progress Notes (Signed)
Patient ID: [name], female   DOB: [birth year], 83 y.o.   MRN: [id number] Complaint:  Visit Type: Patient returns to my office for continued preventative foot care services. Complaint: Patient states" my nails have grown long and thick and become painful to walk and wear shoes" Patient has been diagnosed with DM with no complications. He presents for preventative foot care services. No changes to ROS  Podiatric Exam: Vascular: dorsalis pedis and posterior tibial pulses are palpable bilateral. Capillary return is immediate. Temperature gradient is WNL. Skin turgor WNL  Sensorium: Normal Semmes Weinstein monofilament test. Normal tactile sensation bilaterally. Nail Exam: Pt has thick disfigured discolored nails with subungual debris noted bilateral entire nail hallux through fifth toenails Ulcer Exam: There is no evidence of ulcer or pre-ulcerative changes or infection. Orthopedic Exam: Muscle tone and strength are WNL. No limitations in general ROM. No crepitus or effusions noted. Foot type and digits show no abnormalities. Bony prominences are unremarkable. Skin: No Porokeratosis. No infection or ulcers  Diagnosis:  Tinea unguium, Pain in right toe, pain in left toes  Treatment & Plan Procedures and Treatment: Consent by patient was obtained for treatment procedures. The patient understood the discussion of treatment and procedures well. All questions were answered thoroughly reviewed. Debridement of mycotic and hypertrophic toenails, 1 through 5 bilateral and clearing of subungual debris. No ulceration, no infection noted.    Return Visit-Office Procedure: Patient instructed to return to the office for a follow up visit 3 months for continued evaluation and treatment.    [name] DPM

## 2017-01-20 ENCOUNTER — Encounter (INDEPENDENT_AMBULATORY_CARE_PROVIDER_SITE_OTHER): Payer: Self-pay

## 2017-01-20 ENCOUNTER — Encounter: Payer: Self-pay | Admitting: Physician Assistant

## 2017-01-20 ENCOUNTER — Ambulatory Visit (INDEPENDENT_AMBULATORY_CARE_PROVIDER_SITE_OTHER): Payer: Medicare Other | Admitting: Physician Assistant

## 2017-01-20 ENCOUNTER — Ambulatory Visit (INDEPENDENT_AMBULATORY_CARE_PROVIDER_SITE_OTHER): Payer: Medicare Other | Admitting: *Deleted

## 2017-01-20 VITALS — BP 129/70 | HR 80 | Ht 63.0 in | Wt 93.8 lb

## 2017-01-20 DIAGNOSIS — Z5181 Encounter for therapeutic drug level monitoring: Secondary | ICD-10-CM

## 2017-01-20 DIAGNOSIS — R931 Abnormal findings on diagnostic imaging of heart and coronary circulation: Secondary | ICD-10-CM

## 2017-01-20 DIAGNOSIS — I4891 Unspecified atrial fibrillation: Secondary | ICD-10-CM | POA: Diagnosis not present

## 2017-01-20 DIAGNOSIS — I482 Chronic atrial fibrillation: Secondary | ICD-10-CM

## 2017-01-20 DIAGNOSIS — I951 Orthostatic hypotension: Secondary | ICD-10-CM

## 2017-01-20 DIAGNOSIS — I4821 Permanent atrial fibrillation: Secondary | ICD-10-CM

## 2017-01-20 DIAGNOSIS — I10 Essential (primary) hypertension: Secondary | ICD-10-CM

## 2017-01-20 LAB — POCT INR: INR: 2.6

## 2017-01-20 NOTE — Patient Instructions (Signed)
Medication Instructions:  Your physician recommends that you continue on your current medications as directed. Please refer to the Current Medication list given to you today.   Labwork: None ordered  Testing/Procedures: Your physician has requested that you have an echocardiogram. Echocardiography is a painless test that uses sound waves to create images of your heart. It provides your doctor with information about the size and shape of your heart and how well your heart's chambers and valves are working. This procedure takes approximately one hour. There are no restrictions for this procedure.    Follow-Up: Your physician wants you to follow-up in: 6 MONTHS WITH DR. Marlou Porch  You will receive a reminder letter in the mail two months in advance. If you don't receive a letter, please call our office to schedule the follow-up appointment.   Any Other Special Instructions Will Be Listed Below (If Applicable).  Echocardiogram An echocardiogram, or echocardiography, uses sound waves (ultrasound) to produce an image of your heart. The echocardiogram is simple, painless, obtained within a short period of time, and offers valuable information to your health care provider. The images from an echocardiogram can provide information such as:  Evidence of coronary artery disease (CAD).  Heart size.  Heart muscle function.  Heart valve function.  Aneurysm detection.  Evidence of a past heart attack.  Fluid buildup around the heart.  Heart muscle thickening.  Assess heart valve function.  Tell a health care provider about:  Any allergies you have.  All medicines you are taking, including vitamins, herbs, eye drops, creams, and over-the-counter medicines.  Any problems you or family members have had with anesthetic medicines.  Any blood disorders you have.  Any surgeries you have had.  Any medical conditions you have.  Whether you are pregnant or may be pregnant. What happens  before the procedure? No special preparation is needed. Eat and drink normally. What happens during the procedure?  In order to produce an image of your heart, gel will be applied to your chest and a wand-like tool (transducer) will be moved over your chest. The gel will help transmit the sound waves from the transducer. The sound waves will harmlessly bounce off your heart to allow the heart images to be captured in real-time motion. These images will then be recorded.  You may need an IV to receive a medicine that improves the quality of the pictures. What happens after the procedure? You may return to your normal schedule including diet, activities, and medicines, unless your health care provider tells you otherwise. This information is not intended to replace advice given to you by your health care provider. Make sure you discuss any questions you have with your health care provider. Document Released: 05/02/2000 Document Revised: 12/22/2015 Document Reviewed: 01/10/2013 Elsevier Interactive Patient Education  2017 [employer].    If you need a refill on your cardiac medications before your next appointment, please call your pharmacy.

## 2017-01-20 NOTE — Progress Notes (Signed)
Cardiology Office Note    Date:  01/20/2017  ID:  [name], DOB February 21, 1929, MRN [id number] PCP:  Antony Contras, MD  Cardiologist:  Dr. Hampton Abbot  Chief Complaint: f/u atrial fib  History of Present Illness:  [name] is a 83 y.o. female with history of permanent atrial fibrillation s/p AVN ablation 2010 with complete heart block s/p PPM (gen change 2015, pt preference for Coumadin), prior EF 45% felt due to chronic pacing (50-55% in 2016), orthostatic hypotension, DM, HTN, TIA, dyspnea, dizziness, protein-calorie malnutrition, COPD, probable CKD stage III by labs (Cr 1.1-1.3) who presents back for routine follow-up.  Last echo 07/2014: EF 50-55%, no RWMA, mild MR, mild LAE, possible thrombus on pacer wire versus vegetation. No prior TEE noted. OVs in 2016 report no systemic sx. Dr. Kandis Mannan note references prior stress test - Septal wall hypokinesis noted a nuclear stress test which actually showed an EF of 64%. This result is not available in Epic. She saw Dr. Caryl Comes earlier this year for dizziness of unclear etiology. PPM was adjusted.   She presents back for routine follow-up feeling great. As of 1 month ago, her dizziness significantly improved. She is not sure what exactly helped as she did not try anything different. Her PCP has since referred her to neurology with that appointment pending. She was thinking of canceling because she's felt so good but has decided to keep it as a precaution. No falls since last year. No SOB. She has rare brief second of sharp chest pain about once a week, nonexertional. No bleeding. Has been active in the garden this year. PCP labs 11/2016 showed A1C 7.0, WBC 8.4, Hgb 15.7, Plt 197, glucose 146, BUN 28, CR 1.28, Na 136, K 3.4, CO2 34, Tprot 6.9, albumin 4.5, ALP 76, AST 18, ALT 19, TSH 2.11, Tchol 156, trig 99, HDL 53, LDL 83.   Past Medical History:  Diagnosis Date  . CKD (chronic kidney disease), stage III   . COPD (chronic obstructive pulmonary  disease) ([place])   . Depression   . Diabetes mellitus without complication ([place])   . Headache   . Hypertension   . Orthostatic hypotension   . Osteopenia   . Pacemaker   . 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 Permanent atrial fibrillation ([place])    a.  s/p AVN ablation 2010 with complete heart block s/p PPM (gen change 2015, pt preference for Coumadin).  . Protein calorie malnutrition ([place])   . S/P AV nodal ablation   . TIA (transient ischemic attack)     Past Surgical History:  Procedure Laterality Date  . ABLATION  2012   AVN ablation by Dr Lovena Le  . cateracts     eyes  . CYST REMOVAL TRUNK     from breast  . PACEMAKER INSERTION    . PERMANENT PACEMAKER GENERATOR CHANGE N/A 07/18/2013   Procedure: PERMANENT PACEMAKER GENERATOR CHANGE;  Surgeon: Deboraha Sprang, MD;  Location: [facility] CATH LAB;  Service: Cardiovascular;  Laterality: N/A;    Current Medications: Current Meds  Medication Sig  . clonazePAM (KLONOPIN) 0.5 MG tablet Take 0.5 mg by mouth at bedtime.  Marland Kitchen escitalopram (LEXAPRO) 20 MG tablet Take 20 mg by mouth daily.  . Insulin Glargine (TOUJEO SOLOSTAR) 300 UNIT/ML SOPN Inject 4 Units/day into the skin.  Marland Kitchen LORazepam (ATIVAN) 1 MG tablet Take 1 mg by mouth at bedtime as needed for anxiety or sleep.   . Melatonin 5  MG TABS Take 5 mg by mouth daily.  . Multiple Vitamin (MULTIVITAMIN) tablet Take 1 tablet by mouth daily.  . pregabalin (LYRICA) 50 MG capsule Take 1 capsule (50 mg total) by mouth daily.  Marland Kitchen telmisartan (MICARDIS) 80 MG tablet Take 1 tablet (80 mg total) by mouth daily.  Marland Kitchen warfarin (COUMADIN) 1 MG tablet take as directed BY COUMADIN CLINIC  . warfarin (COUMADIN) 1 MG tablet take as directed BY COUMADIN CLINIC     Allergies:   Eliquis [apixaban]; Gabapentin; Rozerem [ramelteon]; Temazepam; Tikosyn [dofetilide]; and Tradjenta [linagliptin]   Social History   Social History  . Marital status: Married    Spouse name:  [name]  . Number of children: 0  . Years of education: 37   Occupational History  .      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 Smoking status: Current Every Day Smoker  . Smokeless tobacco: Never Used     Comment: 03/05/16 1/2 - 1 ppd  . Alcohol use No  . Drug use: No  . Sexual activity: Not Asked   Other Topics Concern  . None   Social History Narrative  . None     Family History:  Family History  Problem Relation Age of Onset  . Stroke Father      ROS:   Please see the history of present illness.  All other systems are reviewed and otherwise negative.     PHYSICAL EXAM:   VS:  BP 129/70   Pulse 80   Ht 5\' 3"  (1.6 m)   Wt 93 lb 12.8 oz (42.5 kg)   LMP  (LMP Unknown)   BMI 16.62 kg/m   BMI: Body mass index is 16.62 kg/m. GEN: Thin WF in no acute distress  HEENT: normocephalic, atraumatic Neck: no JVD, carotid bruits, or masses Cardiac: RRR; no murmurs, rubs, or gallops, no edema  Respiratory:  clear to auscultation bilaterally, normal work of breathing GI: soft, nontender, nondistended, + BS MS: no deformity or atrophy  Skin: warm and dry, no rash Neuro:  Alert and Oriented x 3, Strength and sensation are intact, follows commands Psych: euthymic mood, full affect  Wt Readings from Last 3 Encounters:  01/20/17 93 lb 12.8 oz (42.5 kg)  08/14/16 96 lb 9.6 oz (43.8 kg)  08/12/16 98 lb 6.4 oz (44.6 kg)      Studies/Labs Reviewed:   EKG:  EKG was not ordered today  Recent Labs: 01/22/2016: BUN 28; Creatinine, Ser 1.30; Hemoglobin 14.7; Platelets 165; Potassium 4.6; Sodium 135   Lipid Panel   Additional studies/ records that were reviewed today include: Summarized above.   ASSESSMENT & PLAN:   1. Permanent atrial fib - s/p AV ablation and PPM, followed by Dr. Caryl Comes. She reports dizziness this spring. Interestingly this significantly improved without particular intervention about a month ago. Orthostatics were negative today. She did  have PPM adjusted but this was in 08/2016. As such, would not make any changes today. She is happy remaining on Coumadin for now (prior reaction with Eliquis). I do think it's worthwhile for her to see neurology. Recent Hgb stable. Discussed importance of preventing falls, maintaining good nutrition given her low BMI and observing for any bleeding. 2. Orthostatic hypotension - orthostatic VS were normal today. Continue to monitor. 3. HTN - BP controlled on present regimen. Follow.  4. Prior abnormal echo - d/w Dr. Marlou Porch. Prior question of density in 2016. Will update echocardiogram. She's not had any systemic  symptoms or infective sx. She remains on anticoagulation.  Disposition: F/u with Dr. Marlou Porch in 6 months. EP f/u already being managed by device clinic.   Medication Adjustments/Labs and Tests Ordered: Current medicines are reviewed at length with the patient today.  Concerns regarding medicines are outlined above. Medication changes, Labs and Tests ordered today are summarized above and listed in the Patient Instructions accessible in Encounters.   Signed, Charlie Pitter, PA-C  01/20/2017 2:58 PM    [facility] [address], [place], [place]  [address] Phone: [phone]; Fax: [phone]

## 2017-01-26 ENCOUNTER — Ambulatory Visit (HOSPITAL_COMMUNITY): Payer: Medicare Other | Attending: Cardiovascular Disease

## 2017-01-26 ENCOUNTER — Other Ambulatory Visit: Payer: Self-pay

## 2017-01-26 DIAGNOSIS — I1310 Hypertensive heart and chronic kidney disease without heart failure, with stage 1 through stage 4 chronic kidney disease, or unspecified chronic kidney disease: Secondary | ICD-10-CM | POA: Insufficient documentation

## 2017-01-26 DIAGNOSIS — R931 Abnormal findings on diagnostic imaging of heart and coronary circulation: Secondary | ICD-10-CM | POA: Diagnosis not present

## 2017-01-26 DIAGNOSIS — I951 Orthostatic hypotension: Secondary | ICD-10-CM | POA: Diagnosis not present

## 2017-01-26 DIAGNOSIS — I482 Chronic atrial fibrillation: Secondary | ICD-10-CM | POA: Insufficient documentation

## 2017-01-26 DIAGNOSIS — I10 Essential (primary) hypertension: Secondary | ICD-10-CM

## 2017-01-26 DIAGNOSIS — J449 Chronic obstructive pulmonary disease, unspecified: Secondary | ICD-10-CM | POA: Diagnosis not present

## 2017-01-26 DIAGNOSIS — Z8673 Personal history of transient ischemic attack (TIA), and cerebral infarction without residual deficits: Secondary | ICD-10-CM | POA: Insufficient documentation

## 2017-01-26 DIAGNOSIS — I4821 Permanent atrial fibrillation: Secondary | ICD-10-CM

## 2017-01-26 DIAGNOSIS — N189 Chronic kidney disease, unspecified: Secondary | ICD-10-CM | POA: Diagnosis not present

## 2017-01-26 DIAGNOSIS — I081 Rheumatic disorders of both mitral and tricuspid valves: Secondary | ICD-10-CM | POA: Insufficient documentation

## 2017-01-29 DIAGNOSIS — M810 Age-related osteoporosis without current pathological fracture: Secondary | ICD-10-CM | POA: Diagnosis not present

## 2017-02-11 ENCOUNTER — Encounter: Payer: Self-pay | Admitting: Diagnostic Neuroimaging

## 2017-02-11 ENCOUNTER — Ambulatory Visit (INDEPENDENT_AMBULATORY_CARE_PROVIDER_SITE_OTHER): Payer: Medicare Other | Admitting: Diagnostic Neuroimaging

## 2017-02-11 VITALS — BP 139/90 | HR 84 | Ht 63.0 in | Wt 94.0 lb

## 2017-02-11 DIAGNOSIS — R269 Unspecified abnormalities of gait and mobility: Secondary | ICD-10-CM | POA: Diagnosis not present

## 2017-02-11 DIAGNOSIS — G250 Essential tremor: Secondary | ICD-10-CM

## 2017-02-11 NOTE — Progress Notes (Signed)
[facility]  PATIENT: [name] DOB: [birth year]  REFERRING CLINICIAN: D Swayne HISTORY FROM: patient and husband  REASON FOR VISIT: follow up     HISTORICAL  CHIEF COMPLAINT:  Chief Complaint  Patient presents with  . Follow-up    former pt, last seen Dr. Leta Baptist 02/2016  . Dizziness    is not presently dizzy.  (08/2016 after adjustment of pacemker).  had orthostatic Bp done with Dr. Caryl Comes and ok     HISTORY OF PRESENT ILLNESS:   UPDATE (02/11/17, VRP): Since last visit, doing well. Had some positional dizziness / swiminess in summer 2018. By mid august 2018, dizziness has resolved. No alleviating or aggravating factors. Feels well today.  PRIOR HPI (03/05/16): 83 year old female here for evaluation of dizziness and headaches. History of diabetes, heart disease, anxiety, insomnia, atrial fibrillation on anticoagulation. Beginning of September 2017 patient woke up in the middle the night, was walking around in the dark, reached for a chair, missed it and fell down. She struck her left temple on the arm of the chair, then returned to her bed and went back to sleep. Over the next few day she developed increasing pain and swelling in the left temporal region. She developed a very severe bruise around her left eye. 1-2 weeks later she went to the emergency room for evaluation, CT scan of the head was performed which showed no acute findings. Since that time symptoms have continued. Patient continues to have balance and dizziness problems. She continues to have intermittent headaches. She's been using Tylenol without relief. Patient has chronic insomnia and anxiety problems, currently on Lexapro, Lunesta, Ativan. Also takes Lyrica for diabetic neuropathy and pain in the feet.   REVIEW OF SYSTEMS: Full 14 system review of systems performed and negative with exception of: fatigue headaches anxiety.   ALLERGIES: Allergies  Allergen Reactions  . Eliquis [Apixaban]  Other (See Comments)    Made pt have weak muscles  . Gabapentin     SHAKES AND NAUSEA   . Rozerem [Ramelteon]     DIZZY AND HEAD THROBBING  . Temazepam     INSOMNIA  . Tikosyn [Dofetilide]     Mouth swells, itching  . Tradjenta [Linagliptin] Diarrhea    Joint Pain, Stomach issues    HOME MEDICATIONS: Outpatient Medications Prior to Visit  Medication Sig Dispense Refill  . clonazePAM (KLONOPIN) 0.5 MG tablet Take 0.5 mg by mouth at bedtime.  2  . escitalopram (LEXAPRO) 20 MG tablet Take 20 mg by mouth daily.  0  . Insulin Glargine (TOUJEO SOLOSTAR) 300 UNIT/ML SOPN Inject 4 Units/day into the skin.    . Melatonin 5 MG TABS Take 5 mg by mouth daily.    . Multiple Vitamin (MULTIVITAMIN) tablet Take 1 tablet by mouth daily.    . pregabalin (LYRICA) 50 MG capsule Take 1 capsule (50 mg total) by mouth daily. 30 capsule 5  . telmisartan (MICARDIS) 80 MG tablet Take 1 tablet (80 mg total) by mouth daily. (Patient taking differently: Take 40 mg by mouth daily. ) 30 tablet 6  . warfarin (COUMADIN) 1 MG tablet take as directed BY COUMADIN CLINIC 60 tablet 3  . LORazepam (ATIVAN) 1 MG tablet Take 1 mg by mouth at bedtime as needed for anxiety or sleep.     Marland Kitchen warfarin (COUMADIN) 1 MG tablet take as directed BY COUMADIN CLINIC 60 tablet 3   No facility-administered medications prior to visit.     PAST MEDICAL  HISTORY: Past Medical History:  Diagnosis Date  . CKD (chronic kidney disease), stage III   . COPD (chronic obstructive pulmonary disease) ([place])   . Depression   . Diabetes mellitus without complication ([place])   . Headache   . Hypertension   . Orthostatic hypotension   . Osteopenia   . Pacemaker   . 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 Permanent atrial fibrillation ([place])    a.  s/p AVN ablation 2010 with complete heart block s/p PPM (gen change 2015, pt preference for Coumadin).  . Protein calorie malnutrition ([place])   . S/P AV nodal  ablation   . TIA (transient ischemic attack)     PAST SURGICAL HISTORY: Past Surgical History:  Procedure Laterality Date  . ABLATION  2012   AVN ablation by Dr Lovena Le  . cateracts     eyes  . CYST REMOVAL TRUNK     from breast  . PACEMAKER INSERTION    . PERMANENT PACEMAKER GENERATOR CHANGE N/A 07/18/2013   Procedure: PERMANENT PACEMAKER GENERATOR CHANGE;  Surgeon: Deboraha Sprang, MD;  Location: [facility] CATH LAB;  Service: Cardiovascular;  Laterality: N/A;    FAMILY HISTORY: Family History  Problem Relation Age of Onset  . Stroke Father     SOCIAL HISTORY:  Social History   Social History  . Marital status: Married    Spouse name: [name]  . Number of children: 0  . Years of education: 55   Occupational History  .      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 Smoking status: Current Every Day Smoker    Packs/day: 1.00  . Smokeless tobacco: Never Used     Comment: 03/05/16 1/2 - 1 ppd  . Alcohol use No  . Drug use: No  . Sexual activity: Not on file   Other Topics Concern  . Not on file   Social History Narrative   Lives at home with husband.  Education PH[employer].  No children.       PHYSICAL EXAM  GENERAL EXAM/CONSTITUTIONAL: Vitals:  Vitals:   02/11/17 1040  BP: 139/90  Pulse: 84  Weight: 94 lb (42.6 kg)  Height: 5\' 3"  (1.6 m)   Body mass index is 16.65 kg/m. No exam data present  Patient is in no distress; well developed, nourished and groomed; neck is supple  CARDIOVASCULAR:  Examination of carotid arteries is normal; no carotid bruits  Regular rate and rhythm, no murmurs  Examination of peripheral vascular system by observation and palpation is normal  EYES:  Ophthalmoscopic exam of optic discs and posterior segments is normal; no papilledema or hemorrhages  MUSCULOSKELETAL:  Gait, strength, tone, movements noted in Neurologic exam below  NEUROLOGIC: MENTAL STATUS:  No flowsheet data found.  awake, alert, oriented to  person, place and time  recent and remote memory intact  normal attention and concentration  language fluent, comprehension intact, naming intact,   fund of knowledge appropriate  CRANIAL NERVE:   2nd - no papilledema on fundoscopic exam  2nd, 3rd, 4th, 6th - pupils --> IRREGULAR, POST-SURGICAL, NO REACTION; visual fields full to confrontation, extraocular muscles intact, no nystagmus  5th - facial sensation symmetric  7th - facial strength symmetric  8th - hearing intact  9th - palate elevates symmetrically, uvula midline  11th - shoulder shrug symmetric  12th - tongue protrusion midline  MOTOR:   normal bulk and tone, full strength in the BUE, BLE  POSTURAL AND ACTION TREMOR IN BUE  HEAD, MOUTH AND VOICE TREMOR  BRADYKINESIA IN BUE AND BLE  SENSORY:   normal and symmetric to light touch, temperature, vibration  COORDINATION:   finger-nose-finger, fine finger movements normal  REFLEXES:   deep tendon reflexes TRACE and symmetric  GAIT/STATION:   SLOW AND CAUTIOUS; STUMBLES TO THE RIGHT SIDE; UNSTEADY; DECR ARM SWING     DIAGNOSTIC DATA (LABS, IMAGING, TESTING) - I reviewed patient records, labs, notes, testing and imaging myself where available.  Lab Results  Component Value Date   WBC 8.6 01/22/2016   HGB 14.7 01/22/2016   HCT 44.7 01/22/2016   MCV 96.8 01/22/2016   PLT 165 01/22/2016      Component Value Date/Time   NA 135 01/22/2016 1641   K 4.6 01/22/2016 1641   CL 99 (L) 01/22/2016 1641   CO2 26 01/22/2016 1641   GLUCOSE 138 (H) 01/22/2016 1641   BUN 28 (H) 01/22/2016 1641   CREATININE 1.30 (H) 01/22/2016 1641   CALCIUM 9.8 01/22/2016 1641   PROT 6.4 07/07/2010 0045   ALBUMIN 3.8 07/07/2010 0045   AST 23 07/07/2010 0045   ALT 19 07/07/2010 0045   ALKPHOS 61 07/07/2010 0045   BILITOT 0.4 07/07/2010 0045   GFRNONAA 37 (L) 01/22/2016 1641   GFRAA 42 (L) 01/22/2016 1641   Lab Results  Component Value Date   CHOL  07/08/2010     89        ATP III CLASSIFICATION:  <200     mg/dL   Desirable  200-239  mg/dL   Borderline High  >=240    mg/dL   High          HDL 35 (L) 07/08/2010   LDLCALC  07/08/2010    34        Total Cholesterol/HDL:CHD Risk Coronary Heart Disease Risk Table                     Men   Women  1/2 Average Risk   3.4   3.3  Average Risk       5.0   4.4  2 X Average Risk   9.6   7.1  3 X Average Risk  23.4   11.0        Use the calculated Patient Ratio above and the CHD Risk Table to determine the patient's CHD Risk.        ATP III CLASSIFICATION (LDL):  <100     mg/dL   Optimal  100-129  mg/dL   Near or Above                    Optimal  130-159  mg/dL   Borderline  160-189  mg/dL   High  >190     mg/dL   Very High   TRIG 99 07/08/2010   CHOLHDL 2.5 07/08/2010   Lab Results  Component Value Date   HGBA1C (H) 07/07/2010    6.7 (NOTE)                                                                       According to the ADA Clinical Practice Recommendations for 2011, when HbA1c is used as  a screening test:   >=6.5%   Diagnostic of Diabetes Mellitus           (if abnormal result  is confirmed)  5.7-6.4%   Increased risk of developing Diabetes Mellitus  References:Diagnosis and Classification of Diabetes Mellitus,Diabetes KYHC,6237,62(GBTDV 1):S62-S69 and Standards of Medical Care in         Diabetes - 2011,Diabetes [id number],0737,10  (Suppl 1):S11-S61.   Lab Results  Component Value Date   GYIRSWNI62 7035 (H) 06/02/2008   Lab Results  Component Value Date   TSH 2.50 10/25/2013    01/22/16 CT head [I reviewed images myself and agree with interpretation. -VRP]  - Normal noncontrast CT HEAD for age.     ASSESSMENT AND PLAN  83 y.o. year old female here with trip and fall at home in the middle of the night Sept 2017, with resultant posttraumatic headaches and dizziness, consistent with postconcussion syndrome. Patient also has baseline balance and gait difficulty which may be related to  underlying deconditioning, age, neuropathy. Patient also with significant anxiety and insomnia over a long time frame, with CNS active meds (klonopin, lyrica) which can contribute to balance difficulty.   Also with tremor (likely essential tremor). Has fam hx tremor in sister and mother.    Dx:  1. Gait difficulty   2. Essential tremor      PLAN:  I spent 20 minutes of face to face time with patient. Greater than 50% of time was spent in counseling and coordination of care with patient. In summary we discussed:   - caution with balance and coordination; may consider PT evaluation - use cane or walker  Return if symptoms worsen or fail to improve, for return to PCP.    Penni Bombard, MD 0/01/3817, 29:93 AM Certified in Neurology, Neurophysiology and Neuroimaging  [facility] [address] [place],  [address] [phone]

## 2017-02-12 DIAGNOSIS — M810 Age-related osteoporosis without current pathological fracture: Secondary | ICD-10-CM | POA: Diagnosis not present

## 2017-02-16 ENCOUNTER — Ambulatory Visit (INDEPENDENT_AMBULATORY_CARE_PROVIDER_SITE_OTHER): Payer: Medicare Other | Admitting: *Deleted

## 2017-02-16 DIAGNOSIS — I442 Atrioventricular block, complete: Secondary | ICD-10-CM | POA: Diagnosis not present

## 2017-02-16 NOTE — Progress Notes (Signed)
Remote pacemaker transmission.   

## 2017-02-17 LAB — CUP PACEART REMOTE DEVICE CHECK
Battery Impedance: 209 Ohm
Battery Remaining Longevity: 118 mo
Battery Voltage: 2.78 V
Brady Statistic RV Percent Paced: 100 %
Date Time Interrogation Session: 20181001150929
Implantable Lead Implant Date: 20080416
Implantable Lead Implant Date: 20080416
Implantable Lead Location: 753859
Implantable Lead Location: 753860
Implantable Lead Model: 4076
Implantable Lead Model: 5076
Implantable Pulse Generator Implant Date: 20150302
Lead Channel Impedance Value: 516 Ohm
Lead Channel Impedance Value: 67 Ohm
Lead Channel Pacing Threshold Amplitude: 0.625 V
Lead Channel Pacing Threshold Pulse Width: 0.4 ms
Lead Channel Setting Pacing Amplitude: 2.5 V
Lead Channel Setting Pacing Pulse Width: 0.4 ms
Lead Channel Setting Sensing Sensitivity: 5.6 mV

## 2017-02-18 ENCOUNTER — Encounter: Payer: Self-pay | Admitting: Cardiology

## 2017-02-18 DIAGNOSIS — Z23 Encounter for immunization: Secondary | ICD-10-CM | POA: Diagnosis not present

## 2017-03-02 ENCOUNTER — Ambulatory Visit (INDEPENDENT_AMBULATORY_CARE_PROVIDER_SITE_OTHER): Payer: Medicare Other | Admitting: *Deleted

## 2017-03-02 DIAGNOSIS — Z5181 Encounter for therapeutic drug level monitoring: Secondary | ICD-10-CM

## 2017-03-02 DIAGNOSIS — I4891 Unspecified atrial fibrillation: Secondary | ICD-10-CM | POA: Diagnosis not present

## 2017-03-02 LAB — POCT INR: INR: 2.1

## 2017-03-24 DIAGNOSIS — G4700 Insomnia, unspecified: Secondary | ICD-10-CM | POA: Diagnosis not present

## 2017-04-07 ENCOUNTER — Encounter: Payer: Self-pay | Admitting: Podiatry

## 2017-04-07 ENCOUNTER — Ambulatory Visit (INDEPENDENT_AMBULATORY_CARE_PROVIDER_SITE_OTHER): Payer: Medicare Other | Admitting: Podiatry

## 2017-04-07 DIAGNOSIS — B351 Tinea unguium: Secondary | ICD-10-CM

## 2017-04-07 DIAGNOSIS — M79676 Pain in unspecified toe(s): Secondary | ICD-10-CM | POA: Diagnosis not present

## 2017-04-07 DIAGNOSIS — E1142 Type 2 diabetes mellitus with diabetic polyneuropathy: Secondary | ICD-10-CM | POA: Diagnosis not present

## 2017-04-07 NOTE — Progress Notes (Signed)
Patient ID: [name], female   DOB: 06-Aug-1928, 83 y.o.   MRN: [id number] Complaint:  Visit Type: Patient returns to my office for continued preventative foot care services. Complaint: Patient states" my nails have grown long and thick and become painful to walk and wear shoes" Patient has been diagnosed with DM with no complications. He presents for preventative foot care services. No changes to ROS  Podiatric Exam: Vascular: dorsalis pedis and posterior tibial pulses are palpable bilateral. Capillary return is immediate. Temperature gradient is WNL. Skin turgor WNL  Sensorium: Normal Semmes Weinstein monofilament test. Normal tactile sensation bilaterally. Nail Exam: Pt has thick disfigured discolored nails with subungual debris noted bilateral entire nail hallux through fifth toenails Ulcer Exam: There is no evidence of ulcer or pre-ulcerative changes or infection. Orthopedic Exam: Muscle tone and strength are WNL. No limitations in general ROM. No crepitus or effusions noted. Foot type and digits show no abnormalities. Bony prominences are unremarkable. Skin: No Porokeratosis. No infection or ulcers  Diagnosis:  Tinea unguium, Pain in right toe, pain in left toes  Treatment & Plan Procedures and Treatment: Consent by patient was obtained for treatment procedures. The patient understood the discussion of treatment and procedures well. All questions were answered thoroughly reviewed. Debridement of mycotic and hypertrophic toenails, 1 through 5 bilateral and clearing of subungual debris. No ulceration, no infection noted.    Return Visit-Office Procedure: Patient instructed to return to the office for a follow up visit 3 months for continued evaluation and treatment.    [name] DPM

## 2017-04-10 ENCOUNTER — Other Ambulatory Visit: Payer: Self-pay | Admitting: Cardiology

## 2017-04-13 ENCOUNTER — Ambulatory Visit (INDEPENDENT_AMBULATORY_CARE_PROVIDER_SITE_OTHER): Payer: Medicare Other | Admitting: *Deleted

## 2017-04-13 ENCOUNTER — Other Ambulatory Visit: Payer: Self-pay | Admitting: Cardiology

## 2017-04-13 DIAGNOSIS — I4891 Unspecified atrial fibrillation: Secondary | ICD-10-CM

## 2017-04-13 DIAGNOSIS — Z5181 Encounter for therapeutic drug level monitoring: Secondary | ICD-10-CM

## 2017-04-13 LAB — POCT INR: INR: 2.4

## 2017-04-13 NOTE — Patient Instructions (Signed)
Continue taking 2 tablets everyday except 1 tablet on Fridays. Recheck INR in 6 weeks. Remain consistent in eating dark leafy greens. Call [phone] with any medication changes or concerns

## 2017-04-21 ENCOUNTER — Telehealth: Payer: Self-pay | Admitting: Podiatry

## 2017-04-21 ENCOUNTER — Other Ambulatory Visit: Payer: Self-pay

## 2017-04-21 MED ORDER — PREGABALIN 50 MG PO CAPS: 50.0000 mg | ORAL_CAPSULE | Freq: Every day | ORAL | 5 refills | Status: DC

## 2017-04-21 NOTE — Telephone Encounter (Signed)
Dr Prudence Davidson, Can I refill Lyrica?

## 2017-04-21 NOTE — Telephone Encounter (Signed)
I need my Lyrica refilled by Dr. Prudence Davidson. Our pharmacy has changed as the other one closed. The [facility]. Their phone number is [phone]. I would appreciate you calling the refill in. Thank you.

## 2017-05-18 ENCOUNTER — Telehealth: Payer: Self-pay | Admitting: Cardiology

## 2017-05-18 ENCOUNTER — Ambulatory Visit (INDEPENDENT_AMBULATORY_CARE_PROVIDER_SITE_OTHER): Payer: Medicare Other | Admitting: *Deleted

## 2017-05-18 DIAGNOSIS — I442 Atrioventricular block, complete: Secondary | ICD-10-CM

## 2017-05-18 NOTE — Telephone Encounter (Signed)
Spoke with pt and reminded pt of remote transmission that is due today. Pt verbalized understanding.   

## 2017-05-18 NOTE — Progress Notes (Signed)
Remote pacemaker transmission.   

## 2017-05-20 LAB — CUP PACEART REMOTE DEVICE CHECK
Battery Impedance: 233 Ohm
Battery Remaining Longevity: 114 mo
Battery Voltage: 2.78 V
Brady Statistic RV Percent Paced: 100 %
Date Time Interrogation Session: 20181231174550
Implantable Lead Implant Date: 20080416
Implantable Lead Implant Date: 20080416
Implantable Lead Location: 753859
Implantable Lead Location: 753860
Implantable Lead Model: 4076
Implantable Lead Model: 5076
Implantable Pulse Generator Implant Date: 20150302
Lead Channel Impedance Value: 519 Ohm
Lead Channel Impedance Value: 67 Ohm
Lead Channel Pacing Threshold Amplitude: 0.5 V
Lead Channel Pacing Threshold Pulse Width: 0.4 ms
Lead Channel Setting Pacing Amplitude: 2.5 V
Lead Channel Setting Pacing Pulse Width: 0.4 ms
Lead Channel Setting Sensing Sensitivity: 5.6 mV

## 2017-05-22 ENCOUNTER — Encounter: Payer: Self-pay | Admitting: Cardiology

## 2017-05-25 ENCOUNTER — Ambulatory Visit (INDEPENDENT_AMBULATORY_CARE_PROVIDER_SITE_OTHER): Payer: Medicare Other | Admitting: Pharmacist

## 2017-05-25 DIAGNOSIS — I4891 Unspecified atrial fibrillation: Secondary | ICD-10-CM | POA: Diagnosis not present

## 2017-05-25 DIAGNOSIS — Z5181 Encounter for therapeutic drug level monitoring: Secondary | ICD-10-CM | POA: Diagnosis not present

## 2017-05-25 LAB — POCT INR: INR: 2.2

## 2017-05-25 NOTE — Patient Instructions (Signed)
Description   Continue taking 2 tablets everyday except 1 tablet on Fridays. Recheck INR in 6 weeks. Remain consistent in eating dark leafy greens. Call [phone] with any medication changes or concerns

## 2017-06-05 DIAGNOSIS — N183 Chronic kidney disease, stage 3 (moderate): Secondary | ICD-10-CM | POA: Diagnosis not present

## 2017-06-05 DIAGNOSIS — F411 Generalized anxiety disorder: Secondary | ICD-10-CM | POA: Diagnosis not present

## 2017-06-05 DIAGNOSIS — I129 Hypertensive chronic kidney disease with stage 1 through stage 4 chronic kidney disease, or unspecified chronic kidney disease: Secondary | ICD-10-CM | POA: Diagnosis not present

## 2017-06-05 DIAGNOSIS — E1149 Type 2 diabetes mellitus with other diabetic neurological complication: Secondary | ICD-10-CM | POA: Diagnosis not present

## 2017-06-08 ENCOUNTER — Other Ambulatory Visit: Payer: Self-pay | Admitting: Cardiology

## 2017-06-09 ENCOUNTER — Telehealth: Payer: Self-pay | Admitting: Diagnostic Neuroimaging

## 2017-06-09 NOTE — Telephone Encounter (Signed)
Patient is calling. She wants to discuss her last office visit in September with Dr. Leta Baptist. She said he told her she had the beginning of Parkinson's.

## 2017-06-09 NOTE — Telephone Encounter (Signed)
No sign of parkinsonism. Monitor symptoms. May return here as needed. -VRP

## 2017-06-09 NOTE — Telephone Encounter (Signed)
I spoke to pt.   She was under the impression that she is showing early signs for PD.  I relayed that essential tremor is not PD.  She feels like she did not misunderstand.  I will be glad to ask so she will not worry about this as she has no f/u appt with [place].

## 2017-06-10 NOTE — Telephone Encounter (Signed)
Spoke to pt and relayed that she did not have parkinsonism per Dr. Leta Baptist.  I relayed that she is to monitor her symptoms and may be seen here if needed.  I instructed that last ofv note states about considering PT, this due to balance/coordination.   Recommended using cane/walker.  She will see what [facility] offers for [facility].  She was appreciative and apologized for bothering [place].

## 2017-07-06 ENCOUNTER — Ambulatory Visit: Payer: Medicare Other

## 2017-07-06 DIAGNOSIS — Z5181 Encounter for therapeutic drug level monitoring: Secondary | ICD-10-CM | POA: Diagnosis not present

## 2017-07-06 DIAGNOSIS — I4891 Unspecified atrial fibrillation: Secondary | ICD-10-CM

## 2017-07-06 LAB — POCT INR: INR: 2.1

## 2017-07-06 NOTE — Patient Instructions (Signed)
Description   Continue on same dosage 2 tablets everyday except 1 tablet on Fridays. Recheck INR in 6 weeks. Remain consistent in eating dark leafy greens. Call [phone] with any medication changes or concerns     

## 2017-07-07 ENCOUNTER — Encounter: Payer: Self-pay | Admitting: Podiatry

## 2017-07-07 ENCOUNTER — Ambulatory Visit (INDEPENDENT_AMBULATORY_CARE_PROVIDER_SITE_OTHER): Payer: Medicare Other | Admitting: Podiatry

## 2017-07-07 DIAGNOSIS — E1142 Type 2 diabetes mellitus with diabetic polyneuropathy: Secondary | ICD-10-CM

## 2017-07-07 DIAGNOSIS — M79676 Pain in unspecified toe(s): Secondary | ICD-10-CM

## 2017-07-07 DIAGNOSIS — B351 Tinea unguium: Secondary | ICD-10-CM | POA: Diagnosis not present

## 2017-07-07 DIAGNOSIS — D689 Coagulation defect, unspecified: Secondary | ICD-10-CM

## 2017-07-07 NOTE — Progress Notes (Signed)
Patient ID: [name], female   DOB: 1928-06-07, 84 y.o.   MRN: [id number] Complaint:  Visit Type: Patient returns to my office for continued preventative foot care services. Complaint: Patient states" my nails have grown long and thick and become painful to walk and wear shoes" Patient has been diagnosed with DM with no complications. He presents for preventative foot care services. No changes to ROS.  Patient is taking coumadin.  Podiatric Exam: Vascular: dorsalis pedis and posterior tibial pulses are palpable bilateral. Capillary return is immediate. Temperature gradient is WNL. Skin turgor WNL  Sensorium: Normal Semmes Weinstein monofilament test. Normal tactile sensation bilaterally. Nail Exam: Pt has thick disfigured discolored nails with subungual debris noted bilateral entire nail hallux through fifth toenails Ulcer Exam: There is no evidence of ulcer or pre-ulcerative changes or infection. Orthopedic Exam: Muscle tone and strength are WNL. No limitations in general ROM. No crepitus or effusions noted. Foot type and digits show no abnormalities. Bony prominences are unremarkable. Skin: No Porokeratosis. No infection or ulcers  Diagnosis:  Tinea unguium, Pain in right toe, pain in left toes  Treatment & Plan Procedures and Treatment: Consent by patient was obtained for treatment procedures. The patient understood the discussion of treatment and procedures well. All questions were answered thoroughly reviewed. Debridement of mycotic and hypertrophic toenails, 1 through 5 bilateral and clearing of subungual debris. No ulceration, no infection noted.    Return Visit-Office Procedure: Patient instructed to return to the office for a follow up visit 3 months for continued evaluation and treatment.    [name] DPM

## 2017-07-15 DIAGNOSIS — E119 Type 2 diabetes mellitus without complications: Secondary | ICD-10-CM | POA: Diagnosis not present

## 2017-07-15 DIAGNOSIS — H353131 Nonexudative age-related macular degeneration, bilateral, early dry stage: Secondary | ICD-10-CM | POA: Diagnosis not present

## 2017-07-15 DIAGNOSIS — H35033 Hypertensive retinopathy, bilateral: Secondary | ICD-10-CM | POA: Diagnosis not present

## 2017-07-15 DIAGNOSIS — H1851 Endothelial corneal dystrophy: Secondary | ICD-10-CM | POA: Diagnosis not present

## 2017-08-12 ENCOUNTER — Encounter (INDEPENDENT_AMBULATORY_CARE_PROVIDER_SITE_OTHER): Payer: Self-pay

## 2017-08-12 ENCOUNTER — Ambulatory Visit: Payer: Medicare Other | Admitting: Internal Medicine

## 2017-08-12 ENCOUNTER — Encounter: Payer: Self-pay | Admitting: Internal Medicine

## 2017-08-12 ENCOUNTER — Ambulatory Visit (INDEPENDENT_AMBULATORY_CARE_PROVIDER_SITE_OTHER): Payer: Medicare Other | Admitting: *Deleted

## 2017-08-12 VITALS — BP 120/74 | HR 86 | Ht 64.0 in | Wt 98.0 lb

## 2017-08-12 DIAGNOSIS — I4821 Permanent atrial fibrillation: Secondary | ICD-10-CM

## 2017-08-12 DIAGNOSIS — I4891 Unspecified atrial fibrillation: Secondary | ICD-10-CM | POA: Diagnosis not present

## 2017-08-12 DIAGNOSIS — I442 Atrioventricular block, complete: Secondary | ICD-10-CM

## 2017-08-12 DIAGNOSIS — I482 Chronic atrial fibrillation: Secondary | ICD-10-CM

## 2017-08-12 DIAGNOSIS — Z950 Presence of cardiac pacemaker: Secondary | ICD-10-CM | POA: Diagnosis not present

## 2017-08-12 DIAGNOSIS — Z5181 Encounter for therapeutic drug level monitoring: Secondary | ICD-10-CM

## 2017-08-12 LAB — POCT INR: INR: 2.5

## 2017-08-12 NOTE — Patient Instructions (Signed)

## 2017-08-12 NOTE — Patient Instructions (Signed)
Description   Continue on same dosage 2 tablets everyday except 1 tablet on Fridays. Recheck INR in 6 weeks. Remain consistent in eating dark leafy greens. Call [phone] with any medication changes or concerns     

## 2017-08-12 NOTE — Progress Notes (Signed)
Patient Care Team: Antony Contras, MD as PCP - General (Family Medicine)   HPI  [name] is a 84 y.o. female Seen for pacemaker followup This had been undertaken for tachybradycardia syndrome. 2012 she underwent AV junction ablation by Dr. Lovena Le.  DATE TEST EF   9/18 Echo   60-65 %           Thromboembolic risk profile includes prior TIA-2, hypertension-1, diabetes-1, gender-1, age-13 for a CHADS-VASc score of 7  She has been on coumadin with prior discussions re NOAC   On Anticoagulation;  No bleeding issues   No neuro symptoms  No edema orthopnea or PND   No palps   No chest pain     Past Medical History:  Diagnosis Date  . CKD (chronic kidney disease), stage III ([place])   . COPD (chronic obstructive pulmonary disease) ([place])   . Depression   . Diabetes mellitus without complication ([place])   . Headache   . Hypertension   . Orthostatic hypotension   . Osteopenia   . Pacemaker   . 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 Permanent atrial fibrillation ([place])    a.  s/p AVN ablation 2010 with complete heart block s/p PPM (gen change 2015, pt preference for Coumadin).  . Protein calorie malnutrition ([place])   . S/P AV nodal ablation   . TIA (transient ischemic attack)     Past Surgical History:  Procedure Laterality Date  . ABLATION  2012   AVN ablation by Dr Lovena Le  . cateracts     eyes  . CYST REMOVAL TRUNK     from breast  . PACEMAKER INSERTION    . PERMANENT PACEMAKER GENERATOR CHANGE N/A 07/18/2013   Procedure: PERMANENT PACEMAKER GENERATOR CHANGE;  Surgeon: Deboraha Sprang, MD;  Location: [facility] CATH LAB;  Service: Cardiovascular;  Laterality: N/A;    Current Outpatient Medications  Medication Sig Dispense Refill  . clonazePAM (KLONOPIN) 0.5 MG tablet Take 0.5 mg by mouth at bedtime.  2  . escitalopram (LEXAPRO) 20 MG tablet Take 20 mg by mouth daily.  0  . hydrochlorothiazide (HYDRODIURIL) 12.5 MG tablet Take  0.5 tablets (6.25 mg total) by mouth daily. 45 tablet 2  . Insulin Glargine (TOUJEO SOLOSTAR) 300 UNIT/ML SOPN Inject 4 Units/day into the skin.    . Melatonin 5 MG TABS Take 5 mg by mouth daily.    . Multiple Vitamin (MULTIVITAMIN) tablet Take 1 tablet by mouth daily.    . pregabalin (LYRICA) 50 MG capsule Take 1 capsule (50 mg total) by mouth daily. 30 capsule 5  . telmisartan (MICARDIS) 80 MG tablet Take 1 tablet (80 mg total) by mouth daily. (Patient taking differently: Take 40 mg by mouth daily. ) 30 tablet 6  . warfarin (COUMADIN) 1 MG tablet TAKE AS DIRECTED BY COUMADIN CLINIC 60 tablet 2   No current facility-administered medications for this visit.     Allergies  Allergen Reactions  . Eliquis [Apixaban] Other (See Comments)    Made pt have weak muscles  . Gabapentin     SHAKES AND NAUSEA   . Rozerem [Ramelteon]     DIZZY AND HEAD THROBBING  . Temazepam     INSOMNIA  . Tikosyn [Dofetilide]     Mouth swells, itching  . Tradjenta [Linagliptin] Diarrhea    Joint Pain, Stomach issues    Review of Systems negative except from HPI and PMH  Physical Exam  BP 120/74   Pulse 86   Ht 5\' 4"  (1.626 m)   Wt 98 lb (44.5 kg)   LMP  (LMP Unknown)   SpO2 95%   BMI 16.82 kg/m  Well developed and thin in no acute distress HENT normal Neck supple with JVP-flat Clear Regular rate and rhythm, no murmurs or gallops Abd-soft with active BS No Clubbing cyanosis edema Skin-warm and dry A & Oriented  Grossly normal sensory and motor function   ECG atrial fib @ 83 -/14/41  Assessment and  Plan  Atrial fibrillation  Complete heart block s/p AV ablation  Hypertension   Pacemaker Medtronic  The patient's device was interrogated.  The information was reviewed. No changes were made in the programming.    On Anticoagulation;  No bleeding issues   BP well controlled

## 2017-08-13 LAB — CUP PACEART INCLINIC DEVICE CHECK
Battery Impedance: 257 Ohm
Battery Remaining Longevity: 111 mo
Battery Voltage: 2.78 V
Brady Statistic RV Percent Paced: 100 %
Date Time Interrogation Session: 20190327204417
Implantable Lead Implant Date: 20080416
Implantable Lead Implant Date: 20080416
Implantable Lead Location: 753859
Implantable Lead Location: 753860
Implantable Lead Model: 4076
Implantable Lead Model: 5076
Implantable Pulse Generator Implant Date: 20150302
Lead Channel Impedance Value: 523 Ohm
Lead Channel Impedance Value: 67 Ohm
Lead Channel Pacing Threshold Amplitude: 0.5 V
Lead Channel Pacing Threshold Amplitude: 0.625 V
Lead Channel Pacing Threshold Pulse Width: 0.4 ms
Lead Channel Pacing Threshold Pulse Width: 0.4 ms
Lead Channel Setting Pacing Amplitude: 2.5 V
Lead Channel Setting Pacing Pulse Width: 0.4 ms
Lead Channel Setting Sensing Sensitivity: 5.6 mV

## 2017-08-14 DIAGNOSIS — M810 Age-related osteoporosis without current pathological fracture: Secondary | ICD-10-CM | POA: Diagnosis not present

## 2017-08-17 ENCOUNTER — Ambulatory Visit (INDEPENDENT_AMBULATORY_CARE_PROVIDER_SITE_OTHER): Payer: Medicare Other | Admitting: *Deleted

## 2017-08-17 DIAGNOSIS — I442 Atrioventricular block, complete: Secondary | ICD-10-CM

## 2017-08-17 NOTE — Progress Notes (Signed)
Remote pacemaker transmission.   

## 2017-08-18 ENCOUNTER — Encounter: Payer: Self-pay | Admitting: Cardiology

## 2017-08-19 LAB — CUP PACEART REMOTE DEVICE CHECK
Battery Impedance: 282 Ohm
Battery Remaining Longevity: 108 mo
Battery Voltage: 2.78 V
Brady Statistic RV Percent Paced: 100 %
Date Time Interrogation Session: 20190401144450
Implantable Lead Implant Date: 20080416
Implantable Lead Implant Date: 20080416
Implantable Lead Location: 753859
Implantable Lead Location: 753860
Implantable Lead Model: 4076
Implantable Lead Model: 5076
Implantable Pulse Generator Implant Date: 20150302
Lead Channel Impedance Value: 518 Ohm
Lead Channel Impedance Value: 67 Ohm
Lead Channel Pacing Threshold Amplitude: 0.625 V
Lead Channel Pacing Threshold Pulse Width: 0.4 ms
Lead Channel Setting Pacing Amplitude: 2.5 V
Lead Channel Setting Pacing Pulse Width: 0.4 ms
Lead Channel Setting Sensing Sensitivity: 5.6 mV

## 2017-08-26 ENCOUNTER — Other Ambulatory Visit: Payer: Self-pay | Admitting: Cardiology

## 2017-09-08 DIAGNOSIS — E1149 Type 2 diabetes mellitus with other diabetic neurological complication: Secondary | ICD-10-CM | POA: Diagnosis not present

## 2017-09-08 DIAGNOSIS — F339 Major depressive disorder, recurrent, unspecified: Secondary | ICD-10-CM | POA: Diagnosis not present

## 2017-09-08 DIAGNOSIS — I129 Hypertensive chronic kidney disease with stage 1 through stage 4 chronic kidney disease, or unspecified chronic kidney disease: Secondary | ICD-10-CM | POA: Diagnosis not present

## 2017-09-08 DIAGNOSIS — F411 Generalized anxiety disorder: Secondary | ICD-10-CM | POA: Diagnosis not present

## 2017-09-21 ENCOUNTER — Ambulatory Visit: Payer: Medicare Other | Admitting: *Deleted

## 2017-09-21 DIAGNOSIS — I4891 Unspecified atrial fibrillation: Secondary | ICD-10-CM

## 2017-09-21 DIAGNOSIS — Z5181 Encounter for therapeutic drug level monitoring: Secondary | ICD-10-CM | POA: Diagnosis not present

## 2017-09-21 LAB — POCT INR: INR: 2

## 2017-09-21 NOTE — Patient Instructions (Signed)
Description   Today take 2.5 tablets then continue on same dosage 2 tablets everyday except 1 tablet on Fridays. Recheck INR in 6 weeks. Remain consistent in eating dark leafy greens. Call [phone] with any medication changes or concerns

## 2017-09-24 DIAGNOSIS — G4700 Insomnia, unspecified: Secondary | ICD-10-CM | POA: Diagnosis not present

## 2017-10-06 ENCOUNTER — Ambulatory Visit: Payer: Medicare Other | Admitting: Podiatry

## 2017-10-21 ENCOUNTER — Ambulatory Visit (INDEPENDENT_AMBULATORY_CARE_PROVIDER_SITE_OTHER): Payer: Medicare Other | Admitting: Podiatry

## 2017-10-21 ENCOUNTER — Encounter: Payer: Self-pay | Admitting: Podiatry

## 2017-10-21 DIAGNOSIS — M79676 Pain in unspecified toe(s): Secondary | ICD-10-CM

## 2017-10-21 DIAGNOSIS — D689 Coagulation defect, unspecified: Secondary | ICD-10-CM | POA: Diagnosis not present

## 2017-10-21 DIAGNOSIS — E1142 Type 2 diabetes mellitus with diabetic polyneuropathy: Secondary | ICD-10-CM | POA: Diagnosis not present

## 2017-10-21 DIAGNOSIS — B351 Tinea unguium: Secondary | ICD-10-CM | POA: Diagnosis not present

## 2017-10-21 MED ORDER — PREGABALIN 50 MG PO CAPS
50.0000 mg | ORAL_CAPSULE | Freq: Every day | ORAL | 5 refills | Status: DC
Start: 2017-10-21 — End: 2017-10-23

## 2017-10-21 NOTE — Progress Notes (Signed)
Patient ID: Evone U Culbertson, female   DOB: 09/27/1928, 84 y.o.   MRN: [id number] Complaint:  Visit Type: Patient returns to my office for continued preventative foot care services. Complaint: Patient states" my nails have grown long and thick and become painful to walk and wear shoes" Patient has been diagnosed with DM with no complications. He presents for preventative foot care services. No changes to ROS.  Patient is taking coumadin.  Podiatric Exam: Vascular: dorsalis pedis and posterior tibial pulses are palpable bilateral. Capillary return is immediate. Temperature gradient is WNL. Skin turgor WNL  Sensorium: Normal Semmes Weinstein monofilament test. Normal tactile sensation bilaterally. Nail Exam: Pt has thick disfigured discolored nails with subungual debris noted bilateral entire nail hallux through fifth toenails Ulcer Exam: There is no evidence of ulcer or pre-ulcerative changes or infection. Orthopedic Exam: Muscle tone and strength are WNL. No limitations in general ROM. No crepitus or effusions noted. Foot type and digits show no abnormalities. Bony prominences are unremarkable. Skin: No Porokeratosis. No infection or ulcers  Diagnosis:  Tinea unguium, Pain in right toe, pain in left toes  Treatment & Plan Procedures and Treatment: Consent by patient was obtained for treatment procedures. The patient understood the discussion of treatment and procedures well. All questions were answered thoroughly reviewed. Debridement of mycotic and hypertrophic toenails, 1 through 5 bilateral and clearing of subungual debris. No ulceration, no infection noted.   Prescribed  Lyrica  . Return Visit-Office Procedure: Patient instructed to return to the office for a follow up visit 3 months for continued evaluation and treatment.    Kam Kushnir DPM 

## 2017-10-23 ENCOUNTER — Other Ambulatory Visit: Payer: Self-pay | Admitting: Podiatry

## 2017-10-26 ENCOUNTER — Telehealth: Payer: Self-pay | Admitting: Podiatry

## 2017-10-26 ENCOUNTER — Telehealth: Payer: Self-pay | Admitting: *Deleted

## 2017-10-26 NOTE — Telephone Encounter (Signed)
[facility]

## 2017-10-26 NOTE — Telephone Encounter (Signed)
I informed the pt I had refilled the lyrica +2 additional refills and we would see her again and refill after that visit. I apologized for the delay, I was out of the office.

## 2017-10-26 NOTE — Telephone Encounter (Signed)
I saw Dr. Prudence Davidson last Wednesday and he was supposed to refill my Lyrica which he did not send in. I contacted my pharmacy and they told me they faxed a refill request to your office but never received anything back. I ran out of my medication and my pharmacy gave me 2 pills to get through the weekend. [facility] [address]

## 2017-11-02 ENCOUNTER — Ambulatory Visit: Payer: Medicare Other | Admitting: *Deleted

## 2017-11-02 DIAGNOSIS — Z5181 Encounter for therapeutic drug level monitoring: Secondary | ICD-10-CM

## 2017-11-02 DIAGNOSIS — I4891 Unspecified atrial fibrillation: Secondary | ICD-10-CM

## 2017-11-02 LAB — POCT INR: INR: 2.3 (ref 2.0–3.0)

## 2017-11-02 NOTE — Patient Instructions (Signed)
Description   Continue on same dosage 2 tablets everyday except 1 tablet on Fridays. Recheck INR in 6 weeks. Remain consistent in eating dark leafy greens. Call [phone] with any medication changes or concerns     

## 2017-11-16 ENCOUNTER — Telehealth: Payer: Self-pay | Admitting: Cardiology

## 2017-11-16 ENCOUNTER — Ambulatory Visit (INDEPENDENT_AMBULATORY_CARE_PROVIDER_SITE_OTHER): Payer: Medicare Other | Admitting: *Deleted

## 2017-11-16 DIAGNOSIS — I442 Atrioventricular block, complete: Secondary | ICD-10-CM | POA: Diagnosis not present

## 2017-11-16 NOTE — Telephone Encounter (Signed)
Confirmed remote transmission w/ pt husband.   

## 2017-11-17 NOTE — Progress Notes (Signed)
Remote pacemaker transmission.   

## 2017-11-18 ENCOUNTER — Encounter: Payer: Self-pay | Admitting: Cardiology

## 2017-11-20 LAB — CUP PACEART REMOTE DEVICE CHECK
Battery Impedance: 306 Ohm
Battery Remaining Longevity: 104 mo
Battery Voltage: 2.78 V
Brady Statistic RV Percent Paced: 100 %
Date Time Interrogation Session: 20190702133617
Implantable Lead Implant Date: 20080416
Implantable Lead Implant Date: 20080416
Implantable Lead Location: 753859
Implantable Lead Location: 753860
Implantable Lead Model: 4076
Implantable Lead Model: 5076
Implantable Pulse Generator Implant Date: 20150302
Lead Channel Impedance Value: 493 Ohm
Lead Channel Impedance Value: 67 Ohm
Lead Channel Pacing Threshold Amplitude: 0.5 V
Lead Channel Pacing Threshold Pulse Width: 0.4 ms
Lead Channel Setting Pacing Amplitude: 2.5 V
Lead Channel Setting Pacing Pulse Width: 0.4 ms
Lead Channel Setting Sensing Sensitivity: 5.6 mV

## 2017-12-08 DIAGNOSIS — I129 Hypertensive chronic kidney disease with stage 1 through stage 4 chronic kidney disease, or unspecified chronic kidney disease: Secondary | ICD-10-CM | POA: Diagnosis not present

## 2017-12-08 DIAGNOSIS — F411 Generalized anxiety disorder: Secondary | ICD-10-CM | POA: Diagnosis not present

## 2017-12-08 DIAGNOSIS — E1149 Type 2 diabetes mellitus with other diabetic neurological complication: Secondary | ICD-10-CM | POA: Diagnosis not present

## 2017-12-08 DIAGNOSIS — F339 Major depressive disorder, recurrent, unspecified: Secondary | ICD-10-CM | POA: Diagnosis not present

## 2017-12-14 ENCOUNTER — Ambulatory Visit: Payer: Medicare Other | Admitting: *Deleted

## 2017-12-14 DIAGNOSIS — I4891 Unspecified atrial fibrillation: Secondary | ICD-10-CM

## 2017-12-14 DIAGNOSIS — Z5181 Encounter for therapeutic drug level monitoring: Secondary | ICD-10-CM

## 2017-12-14 LAB — POCT INR: INR: 2.5 (ref 2.0–3.0)

## 2017-12-14 NOTE — Patient Instructions (Signed)
Description   Continue on same dosage 2 tablets everyday except 1 tablet on Fridays. Recheck INR in 6 weeks. Remain consistent in eating dark leafy greens. Call [phone] with any medication changes or concerns

## 2017-12-17 ENCOUNTER — Other Ambulatory Visit: Payer: Self-pay | Admitting: Cardiology

## 2017-12-28 ENCOUNTER — Other Ambulatory Visit: Payer: Self-pay | Admitting: Cardiology

## 2017-12-31 ENCOUNTER — Encounter: Payer: Self-pay | Admitting: Cardiology

## 2018-01-07 DIAGNOSIS — K1379 Other lesions of oral mucosa: Secondary | ICD-10-CM | POA: Diagnosis not present

## 2018-01-08 DIAGNOSIS — Z794 Long term (current) use of insulin: Secondary | ICD-10-CM | POA: Diagnosis not present

## 2018-01-08 DIAGNOSIS — E119 Type 2 diabetes mellitus without complications: Secondary | ICD-10-CM | POA: Diagnosis not present

## 2018-01-12 DIAGNOSIS — Z794 Long term (current) use of insulin: Secondary | ICD-10-CM | POA: Diagnosis not present

## 2018-01-12 DIAGNOSIS — E119 Type 2 diabetes mellitus without complications: Secondary | ICD-10-CM | POA: Diagnosis not present

## 2018-01-19 ENCOUNTER — Encounter: Payer: Self-pay | Admitting: Podiatry

## 2018-01-19 ENCOUNTER — Ambulatory Visit (INDEPENDENT_AMBULATORY_CARE_PROVIDER_SITE_OTHER): Payer: Medicare Other | Admitting: Podiatry

## 2018-01-19 DIAGNOSIS — B351 Tinea unguium: Secondary | ICD-10-CM | POA: Diagnosis not present

## 2018-01-19 DIAGNOSIS — D689 Coagulation defect, unspecified: Secondary | ICD-10-CM | POA: Diagnosis not present

## 2018-01-19 DIAGNOSIS — M79676 Pain in unspecified toe(s): Secondary | ICD-10-CM | POA: Diagnosis not present

## 2018-01-19 DIAGNOSIS — E1142 Type 2 diabetes mellitus with diabetic polyneuropathy: Secondary | ICD-10-CM | POA: Diagnosis not present

## 2018-01-19 MED ORDER — PREGABALIN 50 MG PO CAPS: 50.0000 mg | ORAL_CAPSULE | Freq: Every day | ORAL | 2 refills | Status: DC

## 2018-01-19 MED ORDER — PREGABALIN 50 MG PO CAPS: 50.0000 mg | ORAL_CAPSULE | Freq: Every day | ORAL | 0 refills | Status: DC

## 2018-01-19 NOTE — Progress Notes (Signed)
Patient ID: [name], female   DOB: 11/01/1928, 84 y.o.   MRN: [id number] Complaint:  Visit Type: Patient returns to my office for continued preventative foot care services. Complaint: Patient states" my nails have grown long and thick and become painful to walk and wear shoes" Patient has been diagnosed with DM with no complications. He presents for preventative foot care services. No changes to ROS.  Patient is taking coumadin.  Podiatric Exam: Vascular: dorsalis pedis and posterior tibial pulses are palpable bilateral. Capillary return is immediate. Temperature gradient is WNL. Skin turgor WNL  Sensorium: Normal Semmes Weinstein monofilament test. Normal tactile sensation bilaterally. Nail Exam: Pt has thick disfigured discolored nails with subungual debris noted bilateral entire nail hallux through fifth toenails Ulcer Exam: There is no evidence of ulcer or pre-ulcerative changes or infection. Orthopedic Exam: Muscle tone and strength are WNL. No limitations in general ROM. No crepitus or effusions noted. Foot type and digits show no abnormalities. Bony prominences are unremarkable. Skin: No Porokeratosis. No infection or ulcers  Diagnosis:  Tinea unguium, Pain in right toe, pain in left toes  Treatment & Plan Procedures and Treatment: Consent by patient was obtained for treatment procedures. The patient understood the discussion of treatment and procedures well. All questions were answered thoroughly reviewed. Debridement of mycotic and hypertrophic toenails, 1 through 5 bilateral and clearing of subungual debris. No ulceration, no infection noted.   Prescribed  Lyrica  . Return Visit-Office Procedure: Patient instructed to return to the office for a follow up visit 3 months for continued evaluation and treatment.    [name] DPM

## 2018-01-19 NOTE — Addendum Note (Signed)
Addended byDeidre Ala, Jarquavious Fentress L on: 01/19/2018 02:05 PM   Modules accepted: Orders

## 2018-01-19 NOTE — Addendum Note (Signed)
Addended byDeidre Ala, Shylo Dillenbeck L on: 01/19/2018 02:38 PM   Modules accepted: Orders

## 2018-01-22 ENCOUNTER — Ambulatory Visit: Payer: Medicare Other | Admitting: Pharmacist

## 2018-01-22 ENCOUNTER — Encounter: Payer: Self-pay | Admitting: Cardiology

## 2018-01-22 ENCOUNTER — Ambulatory Visit (INDEPENDENT_AMBULATORY_CARE_PROVIDER_SITE_OTHER): Payer: Medicare Other | Admitting: Cardiology

## 2018-01-22 VITALS — BP 124/74 | HR 88 | Ht 64.0 in | Wt 101.8 lb

## 2018-01-22 DIAGNOSIS — Z5181 Encounter for therapeutic drug level monitoring: Secondary | ICD-10-CM | POA: Diagnosis not present

## 2018-01-22 DIAGNOSIS — Z950 Presence of cardiac pacemaker: Secondary | ICD-10-CM | POA: Diagnosis not present

## 2018-01-22 DIAGNOSIS — I442 Atrioventricular block, complete: Secondary | ICD-10-CM

## 2018-01-22 DIAGNOSIS — I4891 Unspecified atrial fibrillation: Secondary | ICD-10-CM | POA: Diagnosis not present

## 2018-01-22 DIAGNOSIS — I482 Chronic atrial fibrillation: Secondary | ICD-10-CM | POA: Diagnosis not present

## 2018-01-22 DIAGNOSIS — I4821 Permanent atrial fibrillation: Secondary | ICD-10-CM

## 2018-01-22 LAB — POCT INR: INR: 2.1 (ref 2.0–3.0)

## 2018-01-22 NOTE — Patient Instructions (Signed)
Medication Instructions:  The current medical regimen is effective;  continue present plan and medications.  Follow-Up: Follow up in 6 months with Lori Gerhardt, NP.  You will receive a letter in the mail 2 months before you are due.  Please call us when you receive this letter to schedule your follow up appointment.  Follow up in 1 year with Dr. Skains.  You will receive a letter in the mail 2 months before you are due.  Please call us when you receive this letter to schedule your follow up appointment.  If you need a refill on your cardiac medications before your next appointment, please call your pharmacy.  Thank you for choosing [facility]!!     

## 2018-01-22 NOTE — Progress Notes (Signed)
[facility] [address], [place]  [address] Phone: [phone] Fax:  [phone]  Date:  01/22/2018   ID:  [name], DOB 1928-06-05, MRN [id number]  PCP:  Antony Contras, MD   History of Present Illness: [name] is a 84 y.o. female with history of permanent atrial fibrillation, status post AV nodal ablation in 2010 for persistent symptoms. Diabetes, EF 45% likely from chronic pacing. Septal wall hypokinesis noted a nuclear stress test which actually showed an EF of 64%. Her pacemaker has been functioning normally. Underlying atrial fibrillation noted.  Had joint pain from Trajenta.  Still continues to smoke. Cough. Allergies. No fevers noted.  No fevers.  She did not like taking Eliquis. Would rather stay on Coumadin. This is fine. Her levels have been stable.  Her main complaint seems to be fatigued, dizziness, headaches since August. She has been discussing this with Dr. Moreen Fowler. She is not noticed any blood loss. She is on anticoagulation, Coumadin. No chest pain, no significant change in shortness of breath.  Had shingles before Christmas. Insomnia. Saw sleep clinic.   01/22/2018- she is here for follow-up of atrial fibrillation-seems to be doing quite well.  Permanent pacing.  Pacemaker dependent.  No bleeding on Coumadin.  Blood pressure much better.  Used to be quite low.  No fevers chills nausea vomiting syncope bleeding.   Wt Readings from Last 3 Encounters:  01/22/18 101 lb 12.8 oz (46.2 kg)  08/12/17 98 lb (44.5 kg)  02/11/17 94 lb (42.6 kg)     Past Medical History:  Diagnosis Date  . CKD (chronic kidney disease), stage III ([place])   . COPD (chronic obstructive pulmonary disease) ([place])   . Depression   . Diabetes mellitus without complication ([place])   . Headache   . Hypertension   . Orthostatic hypotension   . Osteopenia   . Pacemaker   . 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 Permanent atrial  fibrillation ([place])    a.  s/p AVN ablation 2010 with complete heart block s/p PPM (gen change 2015, pt preference for Coumadin).  . Protein calorie malnutrition ([place])   . S/P AV nodal ablation   . TIA (transient ischemic attack)     Past Surgical History:  Procedure Laterality Date  . ABLATION  2012   AVN ablation by Dr Lovena Le  . cateracts     eyes  . CYST REMOVAL TRUNK     from breast  . PACEMAKER INSERTION    . PERMANENT PACEMAKER GENERATOR CHANGE N/A 07/18/2013   Procedure: PERMANENT PACEMAKER GENERATOR CHANGE;  Surgeon: Deboraha Sprang, MD;  Location: [facility] C[facility] LAB;  Service: Cardiovascular;  Laterality: N/A;    Current Outpatient Medications  Medication Sig Dispense Refill  . clonazePAM (KLONOPIN) 0.5 MG tablet Take 0.5 mg by mouth at bedtime.  2  . escitalopram (LEXAPRO) 20 MG tablet Take 20 mg by mouth daily.  0  . hydrochlorothiazide (HYDRODIURIL) 12.5 MG tablet TAKE 0.5 TABLETS (6.25 MG TOTAL) BY MOUTH DAILY. 45 tablet 2  . Insulin Glargine (TOUJEO SOLOSTAR) 300 UNIT/ML SOPN Inject 4 Units/day into the skin.    Marland Kitchen losartan (COZAAR) 100 MG tablet Take 100 mg by mouth daily.    . Melatonin 5 MG TABS Take 5 mg by mouth daily.    . mirtazapine (REMERON) 7.5 MG tablet Take 3.75 mg by mouth at bedtime.     . Multiple Vitamin (  MULTIVITAMIN) tablet Take 1 tablet by mouth daily.    . pregabalin (LYRICA) 50 MG capsule Take 1 capsule (50 mg total) by mouth daily. 30 capsule 2  . warfarin (COUMADIN) 1 MG tablet TAKE AS DIRECTED BY COUMADIN CLINIC 180 tablet 1   No current facility-administered medications for this visit.     Allergies:    Allergies  Allergen Reactions  . Eliquis [Apixaban] Other (See Comments)    Made pt have weak muscles  . Gabapentin     SHAKES AND NAUSEA   . Rozerem [Ramelteon]     DIZZY AND HEAD THROBBING  . Temazepam     INSOMNIA  . Tikosyn [Dofetilide]     Mouth swells, itching  . Tradjenta [Linagliptin] Diarrhea    Joint Pain, Stomach issues     Social History:  The patient  reports that she has been smoking. She has been smoking about 1.00 pack per day. She has never used smokeless tobacco. She reports that she does not drink alcohol or use drugs.   ROS:  Please see the history of present illness   PHYSICAL EXAM: VS:  BP 124/74   Pulse 88   Ht 5\' 4"  (1.626 m)   Wt 101 lb 12.8 oz (46.2 kg)   LMP  (LMP Unknown)   SpO2 95%   BMI 17.47 kg/m  GEN: Thin in no acute distress  HEENT: normal  Neck: no JVD, carotid bruits, or masses Cardiac: RRR; no murmurs, rubs, or gallops,no edema  Respiratory:  clear to auscultation bilaterally, normal work of breathing GI: soft, nontender, nondistended, + BS MS: no deformity or atrophy  Skin: warm and dry, no rash Neuro:  Alert and Oriented x 3, Strength and sensation are intact Psych: euthymic mood, full affect   EKG:  Atrial fibrillation rate 71, ventricular pacing.     Echocardiogram: 08/08/14-normal ejection fraction, there was an oscillating density on pacemaker lead.  ASSESSMENT AND PLAN:  1. Atrial fibrillation- CHADS-VASc 5. Did not like taking Eliquis. Continuing to take Coumadin (2.1 recently). No bleeding.  She had an AV nodal ablation. Pacemaker working well.  01/07/2018-hemoglobin 14.7, creatinine 1.1 2. Essential hypertension- medicines have been adjusted, currently on losartan and low-dose HCTZ.  Looking good. 3. Pacemaker- Dr. Olin Pia note reviewed.  Functioning well. 4. Protein calorie malnutrition-BMI 17. [employer] Instant Breakfast.  She is up a few pounds. 5. Tobacco use-continue to encourage tobacco cessation. She does not wish to quit.  No changes. 6. COPD/bronchitis-continuing to be managed by Dr. Moreen Fowler. Currently stable.  No recent hospitalizations. 7. AV block/AV nodal ablation-permanent pacemaker required. Pacemaker dependent. Dr. Caryl Comes watching.  Functioning well, no change 8. Six-month follow-up.  Lori 6, me 12  Signed, Candee Furbish, MD [facility]  01/22/2018  2:53 PM

## 2018-01-22 NOTE — Patient Instructions (Signed)
Description   Continue on same dosage 2 tablets everyday except 1 tablet on Fridays. Recheck INR in 6 weeks. Remain consistent in eating dark leafy greens. Call [phone] with any medication changes or concerns

## 2018-02-05 DIAGNOSIS — Z23 Encounter for immunization: Secondary | ICD-10-CM | POA: Diagnosis not present

## 2018-02-15 ENCOUNTER — Telehealth: Payer: Self-pay | Admitting: Cardiology

## 2018-02-15 ENCOUNTER — Ambulatory Visit (INDEPENDENT_AMBULATORY_CARE_PROVIDER_SITE_OTHER): Payer: Medicare Other | Admitting: *Deleted

## 2018-02-15 DIAGNOSIS — I442 Atrioventricular block, complete: Secondary | ICD-10-CM

## 2018-02-15 NOTE — Telephone Encounter (Signed)
Confirmed remote transmission w/ pt husband.   

## 2018-02-16 NOTE — Progress Notes (Signed)
Remote pacemaker transmission.   

## 2018-02-19 LAB — CUP PACEART REMOTE DEVICE CHECK
Battery Impedance: 331 Ohm
Battery Remaining Longevity: 102 mo
Battery Voltage: 2.78 V
Brady Statistic RV Percent Paced: 100 %
Date Time Interrogation Session: 20190930185948
Implantable Lead Implant Date: 20080416
Implantable Lead Implant Date: 20080416
Implantable Lead Location: 753859
Implantable Lead Location: 753860
Implantable Lead Model: 4076
Implantable Lead Model: 5076
Implantable Pulse Generator Implant Date: 20150302
Lead Channel Impedance Value: 523 Ohm
Lead Channel Impedance Value: 67 Ohm
Lead Channel Pacing Threshold Amplitude: 0.5 V
Lead Channel Pacing Threshold Pulse Width: 0.4 ms
Lead Channel Setting Pacing Amplitude: 2.5 V
Lead Channel Setting Pacing Pulse Width: 0.4 ms
Lead Channel Setting Sensing Sensitivity: 5.6 mV

## 2018-03-01 ENCOUNTER — Ambulatory Visit: Payer: Medicare Other

## 2018-03-01 DIAGNOSIS — Z5181 Encounter for therapeutic drug level monitoring: Secondary | ICD-10-CM

## 2018-03-01 DIAGNOSIS — I4891 Unspecified atrial fibrillation: Secondary | ICD-10-CM

## 2018-03-01 LAB — POCT INR: INR: 2.7 (ref 2.0–3.0)

## 2018-03-01 NOTE — Patient Instructions (Signed)
Description   Continue on same dosage 2 tablets everyday except 1 tablet on Fridays. Recheck INR in 6 weeks. Remain consistent in eating dark leafy greens. Call [phone] with any medication changes or concerns

## 2018-04-08 DIAGNOSIS — E119 Type 2 diabetes mellitus without complications: Secondary | ICD-10-CM | POA: Diagnosis not present

## 2018-04-08 DIAGNOSIS — Z794 Long term (current) use of insulin: Secondary | ICD-10-CM | POA: Diagnosis not present

## 2018-04-12 ENCOUNTER — Ambulatory Visit: Payer: Medicare Other | Admitting: *Deleted

## 2018-04-12 DIAGNOSIS — I4891 Unspecified atrial fibrillation: Secondary | ICD-10-CM

## 2018-04-12 DIAGNOSIS — Z5181 Encounter for therapeutic drug level monitoring: Secondary | ICD-10-CM

## 2018-04-12 LAB — POCT INR: INR: 3.5 — AB (ref 2.0–3.0)

## 2018-04-12 NOTE — Patient Instructions (Signed)
Description   Do not take any Coumadin today then continue on same dosage 2 tablets everyday except 1 tablet on Fridays. Recheck INR in 4 weeks. Remain consistent in eating dark leafy greens. Call [phone] with any medication changes or concerns

## 2018-04-13 DIAGNOSIS — G4700 Insomnia, unspecified: Secondary | ICD-10-CM | POA: Diagnosis not present

## 2018-04-20 ENCOUNTER — Telehealth: Payer: Self-pay | Admitting: *Deleted

## 2018-04-20 ENCOUNTER — Encounter: Payer: Self-pay | Admitting: Podiatry

## 2018-04-20 ENCOUNTER — Ambulatory Visit (INDEPENDENT_AMBULATORY_CARE_PROVIDER_SITE_OTHER): Payer: Medicare Other | Admitting: Podiatry

## 2018-04-20 DIAGNOSIS — M79676 Pain in unspecified toe(s): Secondary | ICD-10-CM

## 2018-04-20 DIAGNOSIS — B351 Tinea unguium: Secondary | ICD-10-CM | POA: Diagnosis not present

## 2018-04-20 DIAGNOSIS — D689 Coagulation defect, unspecified: Secondary | ICD-10-CM | POA: Diagnosis not present

## 2018-04-20 DIAGNOSIS — E1142 Type 2 diabetes mellitus with diabetic polyneuropathy: Secondary | ICD-10-CM

## 2018-04-20 NOTE — Progress Notes (Signed)
Patient ID: [name], female   DOB: 1928-10-31, 84 y.o.   MRN: [id number] Complaint:  Visit Type: Patient returns to my office for continued preventative foot care services. Complaint: Patient states" my nails have grown long and thick and become painful to walk and wear shoes" Patient has been diagnosed with DM with no complications. He presents for preventative foot care services. No changes to ROS.  Patient is taking coumadin.  Podiatric Exam: Vascular: dorsalis pedis and posterior tibial pulses are palpable bilateral. Capillary return is immediate. Temperature gradient is WNL. Skin turgor WNL  Sensorium: Normal Semmes Weinstein monofilament test. Normal tactile sensation bilaterally. Nail Exam: Pt has thick disfigured discolored nails with subungual debris noted bilateral entire nail hallux through fifth toenails Ulcer Exam: There is no evidence of ulcer or pre-ulcerative changes or infection. Orthopedic Exam: Muscle tone and strength are WNL. No limitations in general ROM. No crepitus or effusions noted. Foot type and digits show no abnormalities. Bony prominences are unremarkable. Skin: No Porokeratosis. No infection or ulcers  Diagnosis:  Tinea unguium, Pain in right toe, pain in left toes  Treatment & Plan Procedures and Treatment: Consent by patient was obtained for treatment procedures. The patient understood the discussion of treatment and procedures well. All questions were answered thoroughly reviewed. Debridement of mycotic and hypertrophic toenails, 1 through 5 bilateral and clearing of subungual debris. No ulceration, no infection noted.   Prescribed  Lyrica  . Return Visit-Office Procedure: Patient instructed to return to the office for a follow up visit 3 months for continued evaluation and treatment.    [name] DPM

## 2018-04-20 NOTE — Telephone Encounter (Signed)
Handwritten prescription for Lyrica 50mg  given to patient 04/20/18,2 refills

## 2018-04-21 ENCOUNTER — Telehealth: Payer: Self-pay | Admitting: *Deleted

## 2018-04-21 NOTE — Telephone Encounter (Signed)
Caryl Pina - [facility] states she needs date for pt's Lyrica rx. I informed Caryl Pina 04/20/2018.

## 2018-04-26 DIAGNOSIS — F339 Major depressive disorder, recurrent, unspecified: Secondary | ICD-10-CM | POA: Diagnosis not present

## 2018-04-26 DIAGNOSIS — I129 Hypertensive chronic kidney disease with stage 1 through stage 4 chronic kidney disease, or unspecified chronic kidney disease: Secondary | ICD-10-CM | POA: Diagnosis not present

## 2018-04-26 DIAGNOSIS — F411 Generalized anxiety disorder: Secondary | ICD-10-CM | POA: Diagnosis not present

## 2018-04-26 DIAGNOSIS — Z0000 Encounter for general adult medical examination without abnormal findings: Secondary | ICD-10-CM | POA: Diagnosis not present

## 2018-05-10 ENCOUNTER — Ambulatory Visit: Payer: Medicare Other

## 2018-05-10 DIAGNOSIS — I4891 Unspecified atrial fibrillation: Secondary | ICD-10-CM | POA: Diagnosis not present

## 2018-05-10 DIAGNOSIS — Z5181 Encounter for therapeutic drug level monitoring: Secondary | ICD-10-CM | POA: Diagnosis not present

## 2018-05-10 LAB — POCT INR: INR: 2.9 (ref 2.0–3.0)

## 2018-05-10 NOTE — Patient Instructions (Signed)
Description   Continue on same dosage 2 tablets everyday except 1 tablet on Fridays. Recheck INR in 4 weeks. Remain consistent in eating dark leafy greens. Call [phone] with any medication changes or concerns

## 2018-05-17 ENCOUNTER — Ambulatory Visit (INDEPENDENT_AMBULATORY_CARE_PROVIDER_SITE_OTHER): Payer: Medicare Other

## 2018-05-17 DIAGNOSIS — I4891 Unspecified atrial fibrillation: Secondary | ICD-10-CM | POA: Diagnosis not present

## 2018-05-17 DIAGNOSIS — I442 Atrioventricular block, complete: Secondary | ICD-10-CM

## 2018-05-18 LAB — CUP PACEART REMOTE DEVICE CHECK
Battery Impedance: 380 Ohm
Battery Remaining Longevity: 97 mo
Battery Voltage: 2.78 V
Brady Statistic RV Percent Paced: 100 %
Date Time Interrogation Session: 20191230145843
Implantable Lead Implant Date: 20080416
Implantable Lead Implant Date: 20080416
Implantable Lead Location: 753859
Implantable Lead Location: 753860
Implantable Lead Model: 4076
Implantable Lead Model: 5076
Implantable Pulse Generator Implant Date: 20150302
Lead Channel Impedance Value: 513 Ohm
Lead Channel Impedance Value: 67 Ohm
Lead Channel Pacing Threshold Amplitude: 0.5 V
Lead Channel Pacing Threshold Pulse Width: 0.4 ms
Lead Channel Setting Pacing Amplitude: 2.5 V
Lead Channel Setting Pacing Pulse Width: 0.4 ms
Lead Channel Setting Sensing Sensitivity: 5.6 mV

## 2018-05-18 NOTE — Progress Notes (Signed)
Remote pacemaker transmission.   

## 2018-06-04 ENCOUNTER — Other Ambulatory Visit: Payer: Self-pay | Admitting: Cardiology

## 2018-06-07 ENCOUNTER — Ambulatory Visit: Payer: Medicare Other

## 2018-06-07 DIAGNOSIS — Z5181 Encounter for therapeutic drug level monitoring: Secondary | ICD-10-CM | POA: Diagnosis not present

## 2018-06-07 DIAGNOSIS — I4891 Unspecified atrial fibrillation: Secondary | ICD-10-CM | POA: Diagnosis not present

## 2018-06-07 LAB — POCT INR: INR: 2.8 (ref 2.0–3.0)

## 2018-06-07 NOTE — Patient Instructions (Signed)
Description   Continue on same dosage 2 tablets everyday except 1 tablet on Fridays. Recheck INR in 5 weeks. Remain consistent in eating dark leafy greens. Call [phone] with any medication changes or concerns

## 2018-07-12 ENCOUNTER — Ambulatory Visit: Payer: Medicare Other

## 2018-07-12 DIAGNOSIS — Z5181 Encounter for therapeutic drug level monitoring: Secondary | ICD-10-CM

## 2018-07-12 DIAGNOSIS — I4891 Unspecified atrial fibrillation: Secondary | ICD-10-CM

## 2018-07-12 LAB — POCT INR: INR: 3.5 — AB (ref 2.0–3.0)

## 2018-07-12 NOTE — Patient Instructions (Signed)
Description   Skip today's dosage of Coumadin, then resume same dosage 2 tablets everyday except 1 tablet on Fridays. Recheck INR in 3 weeks. Remain consistent in eating dark leafy greens. Call [phone] with any medication changes or concerns

## 2018-07-14 DIAGNOSIS — H1851 Endothelial corneal dystrophy: Secondary | ICD-10-CM | POA: Diagnosis not present

## 2018-07-14 DIAGNOSIS — Z961 Presence of intraocular lens: Secondary | ICD-10-CM | POA: Diagnosis not present

## 2018-07-14 DIAGNOSIS — H353131 Nonexudative age-related macular degeneration, bilateral, early dry stage: Secondary | ICD-10-CM | POA: Diagnosis not present

## 2018-07-14 DIAGNOSIS — E119 Type 2 diabetes mellitus without complications: Secondary | ICD-10-CM | POA: Diagnosis not present

## 2018-07-20 ENCOUNTER — Ambulatory Visit: Payer: Medicare Other | Admitting: Podiatry

## 2018-07-20 ENCOUNTER — Encounter: Payer: Self-pay | Admitting: Nurse Practitioner

## 2018-07-20 ENCOUNTER — Ambulatory Visit: Payer: Medicare Other | Admitting: Nurse Practitioner

## 2018-07-20 VITALS — BP 140/80 | HR 87 | Ht 63.0 in | Wt 109.4 lb

## 2018-07-20 DIAGNOSIS — Z950 Presence of cardiac pacemaker: Secondary | ICD-10-CM

## 2018-07-20 DIAGNOSIS — I4821 Permanent atrial fibrillation: Secondary | ICD-10-CM | POA: Diagnosis not present

## 2018-07-20 NOTE — Patient Instructions (Addendum)
We will be checking the following labs today - NONE   Medication Instructions:    Continue with your current medicines.    If you need a refill on your cardiac medications before your next appointment, please call your pharmacy.     Testing/Procedures To Be Arranged:  N/A  Follow-Up:   See Dr. Caryl Comes later this week.  I will see you in 6 months.     At [facility], you and your health needs are our priority.  As part of our continuing mission to provide you with exceptional heart care, we have created designated Provider Care Teams.  These Care Teams include your primary Cardiologist (physician) and Advanced Practice Providers (APPs -  Physician Assistants and Nurse Practitioners) who all work together to provide you with the care you need, when you need it.  Special Instructions:  . None  Call the [place] office at [phone] if you have any questions, problems or concerns.

## 2018-07-20 NOTE — Progress Notes (Signed)
CARDIOLOGY OFFICE NOTE  Date:  07/20/2018    [name] Date of Birth: [birth year] Medical Record #[id number]  PCP:  Antony Contras, MD  Cardiologist:  Marlou Porch & Caryl Comes   Chief Complaint  Patient presents with  . Follow-up    Seen for Dr. Hampton Abbot    History of Present Illness: [name] is a 85 y.o. female who presents today for a work in visit. Seen for Dr. Marlou Porch & Caryl Comes. .  She has a history of permanent atrial fibrillation, status post AV nodal ablation in 2010 for persistent symptoms with underlying PPM. Other issues include DM, chronic systolic HF with  EF 97% likely from chronic pacing, septal wall hypokinesis noted a nuclear stress test which actually showed an EF of 64%. She has had ongoing tobacco abuse. She is on coumadin for her anticoagulation.   Last seen by Dr. Marlou Porch in September - felt to be doing ok. Seeing Dr. Caryl Comes this month.   Comes in today. Here alone. She has been doing well. Has been able to gain some weight. Still smoking and says she will "til I die". No plans to stop. Labs from December noted. No falls but her balance is getting worse. Using her cane. Still going to the gym twice a week for exercise. No bleeding. No chest pain. Breathing is ok. She has no real concern. Tolerating her medicines well.   Past Medical History:  Diagnosis Date  . CKD (chronic kidney disease), stage III ([place])   . COPD (chronic obstructive pulmonary disease) ([place])   . Depression   . Diabetes mellitus without complication ([place])   . Headache   . Hypertension   . Orthostatic hypotension   . Osteopenia   . Pacemaker   . 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 Permanent atrial fibrillation    a.  s/p AVN ablation 2010 with complete heart block s/p PPM (gen change 2015, pt preference for Coumadin).  . Protein calorie malnutrition ([place])   . S/P AV nodal ablation   . TIA (transient ischemic attack)     Past Surgical  History:  Procedure Laterality Date  . ABLATION  2012   AVN ablation by Dr Lovena Le  . cateracts     eyes  . CYST REMOVAL TRUNK     from breast  . PACEMAKER INSERTION    . PERMANENT PACEMAKER GENERATOR CHANGE N/A 07/18/2013   Procedure: PERMANENT PACEMAKER GENERATOR CHANGE;  Surgeon: Deboraha Sprang, MD;  Location: [facility] CATH LAB;  Service: Cardiovascular;  Laterality: N/A;     Medications: Current Meds  Medication Sig  . BD PEN NEEDLE NANO U/F 32G X 4 MM MISC AS DIRECTED USE WITH TOUJEO/ DX E11.49 IN VITRO  . clonazePAM (KLONOPIN) 0.5 MG tablet Take 0.5 mg by mouth at bedtime.  Marland Kitchen escitalopram (LEXAPRO) 20 MG tablet Take 20 mg by mouth daily.  . hydrochlorothiazide (HYDRODIURIL) 12.5 MG tablet TAKE 0.5 TABLETS (6.25 MG TOTAL) BY MOUTH DAILY.  Marland Kitchen Insulin Glargine (TOUJEO SOLOSTAR) 300 UNIT/ML SOPN Inject 4 Units/day into the skin.  Marland Kitchen losartan (COZAAR) 100 MG tablet Take 100 mg by mouth daily.  . magic mouthwash SOLN SWISH AND SWALLOW 1 TEASPOONFUL BY MOUTH EVERY 6 HOURS AS NEEDED  . Melatonin 5 MG TABS Take 5 mg by mouth daily.  . mirtazapine (REMERON) 7.5 MG tablet Take 7.5 mg by mouth at bedtime.   . Multiple Vitamin (MULTIVITAMIN) tablet Take 1 tablet by mouth  daily.  . pregabalin (LYRICA) 50 MG capsule Take 1 capsule (50 mg total) by mouth daily.  [place] Kitchen warfarin (COUMADIN) 1 MG tablet TAKE AS DIRECTED BY COUMADIN CLINIC     Allergies: Allergies  Allergen Reactions  . Eliquis [Apixaban] Other (See Comments)    Made pt have weak muscles  . Gabapentin     SHAKES AND NAUSEA   . Rozerem [Ramelteon]     DIZZY AND HEAD THROBBING  . Temazepam     INSOMNIA  . Tikosyn [Dofetilide]     Mouth swells, itching  . Tradjenta [Linagliptin] Diarrhea    Joint Pain, Stomach issues    Social History: The patient  reports that she has been smoking. She has been smoking about 1.00 pack per day. She has never used smokeless tobacco. She reports that she does not drink alcohol or use drugs.     Family History: The patient's family history includes Stroke in her father.   Review of Systems: Please see the history of present illness.   Otherwise, the review of systems is positive for none.   All other systems are reviewed and negative.   Physical Exam: VS:  BP 140/80 (BP Location: Left Arm, Patient Position: Sitting, Cuff Size: Normal)   Pulse 87   Ht 5\' 3"  (1.6 m)   Wt 109 lb 6.4 oz (49.6 kg)   LMP  (LMP Unknown)   SpO2 93% Comment: at rest  BMI 19.38 kg/m  .  BMI Body mass index is 19.38 kg/m.  Wt Readings from Last 3 Encounters:  07/20/18 109 lb 6.4 oz (49.6 kg)  01/22/18 101 lb 12.8 oz (46.2 kg)  08/12/17 98 lb (44.5 kg)    General: Pleasant. Elderly. Alert and in no acute distress. HEENT: Normal.  Neck: Supple, no JVD, carotid bruits, or masses noted.  Cardiac: Fairly regular (paced)  No murmurs, rubs, or gallops. No edema.  Respiratory:  Decreased breath sounds but with normal work of breathing.  GI: Soft and nontender.  MS: No deformity or atrophy. Gait and ROM intact. Using a cane.  Skin: Warm and dry. Color is normal.  Neuro:  Strength and sensation are intact and no gross focal deficits noted.  Psych: Alert, appropriate and with normal affect.   LABORATORY DATA:  EKG:  EKG is not ordered today.   Lab Results  Component Value Date   WBC 8.6 01/22/2016   HGB 14.7 01/22/2016   HCT 44.7 01/22/2016   PLT 165 01/22/2016   GLUCOSE 138 (H) 01/22/2016   CHOL  07/08/2010    89        ATP III CLASSIFICATION:  <200     mg/dL   Desirable  200-239  mg/dL   Borderline High  >=240    mg/dL   High          TRIG 99 07/08/2010   HDL 35 (L) 07/08/2010   LDLCALC  07/08/2010    34        Total Cholesterol/HDL:CHD Risk Coronary Heart Disease Risk Table                     Men   Women  1/2 Average Risk   3.4   3.3  Average Risk       5.0   4.4  2 X Average Risk   9.6   7.1  3 X Average Risk  23.4   11.0        Use the calculated Patient Ratio  above and  the CHD Risk Table to determine the patient's CHD Risk.        ATP III CLASSIFICATION (LDL):  <100     mg/dL   Optimal  100-129  mg/dL   Near or Above                    Optimal  130-159  mg/dL   Borderline  160-189  mg/dL   High  >190     mg/dL   Very High   ALT 19 07/07/2010   AST 23 07/07/2010   NA 135 01/22/2016   K 4.6 01/22/2016   CL 99 (L) 01/22/2016   CREATININE 1.30 (H) 01/22/2016   BUN 28 (H) 01/22/2016   CO2 26 01/22/2016   TSH 2.50 10/25/2013   INR 3.5 (A) 07/12/2018   HGBA1C (H) 07/07/2010    6.7 (NOTE)                                                                       According to the ADA Clinical Practice Recommendations for 2011, when HbA1c is used as a screening test:   >=6.5%   Diagnostic of Diabetes Mellitus           (if abnormal result  is confirmed)  5.7-6.4%   Increased risk of developing Diabetes Mellitus  References:Diagnosis and Classification of Diabetes Mellitus,Diabetes KDTO,6712,45(YKDXI 1):S62-S69 and Standards of Medical Care in         Diabetes - 2011,Diabetes Care,2011,34  (Suppl 1):S11-S61.     Lab Results  Component Value Date   INR 3.5 (A) 07/12/2018   INR 2.8 06/07/2018   INR 2.9 05/10/2018     BNP (last 3 results) No results for input(s): BNP in the last 8760 hours.  ProBNP (last 3 results) No results for input(s): PROBNP in the last 8760 hours.   Other Studies Reviewed Today:  Echo Study Conclusions 01/2017  - Left ventricle: The cavity size was normal. Systolic function was   normal. The estimated ejection fraction was in the range of 60%   to 65%. Wall motion was normal; there were no regional wall   motion abnormalities. The study is not technically sufficient to   allow evaluation of LV diastolic function. - Aortic valve: Transvalvular velocity was within the normal range.   There was no stenosis. There was no regurgitation. - Mitral valve: Calcified annulus. Transvalvular velocity was   within the normal range.  There was no evidence for stenosis.   There was mild regurgitation. - Left atrium: The atrium was mildly dilated. - Right ventricle: The cavity size was normal. Wall thickness was   normal. Systolic function was normal. - Right atrium: The atrium was severely dilated. - Atrial septum: No defect or patent foramen ovale was identified. - Tricuspid valve: There was mild regurgitation. - Pulmonary arteries: Systolic pressure was within the normal   range. PA peak pressure: 25 mm Hg (S).   Assessment/Plan:  1. Persistent AF - CHADSVASC of at least 5 - did not like Eliquis - continues on Warfarin - has underlying PPM. No change with current regimen.   2. HTN - BP is fair. Little lower at home. For now, no changes.   3. DM -  per PCP  4. Underlying PPM - dependent - prior AV nodal ablation - seeing Dr. Caryl Comes later this week.   5. Ongoing tobacco abuse - no intentions of stopping.   6. Protein calorie malnutrition - has gained some weight.   Current medicines are reviewed with the patient today.  The patient does not have concerns regarding medicines other than what has been noted above.  The following changes have been made:  See above.  Labs/ tests ordered today include:   No orders of the defined types were placed in this encounter.    Disposition:   FU with Dr. Caryl Comes later this week. I will see her back in 6 months.   Patient is agreeable to this plan and will call if any problems develop in the interim.   SignedTruitt Merle, NP  07/20/2018 1:47 PM  [place] [address] [place], [place]  [address] Phone: [phone] Fax: [phone]

## 2018-07-21 ENCOUNTER — Ambulatory Visit: Payer: Medicare Other | Admitting: Podiatry

## 2018-07-21 ENCOUNTER — Encounter: Payer: Self-pay | Admitting: Podiatry

## 2018-07-21 DIAGNOSIS — M79676 Pain in unspecified toe(s): Secondary | ICD-10-CM

## 2018-07-21 DIAGNOSIS — D689 Coagulation defect, unspecified: Secondary | ICD-10-CM

## 2018-07-21 DIAGNOSIS — E1142 Type 2 diabetes mellitus with diabetic polyneuropathy: Secondary | ICD-10-CM

## 2018-07-21 DIAGNOSIS — B351 Tinea unguium: Secondary | ICD-10-CM

## 2018-07-21 DIAGNOSIS — E119 Type 2 diabetes mellitus without complications: Secondary | ICD-10-CM | POA: Diagnosis not present

## 2018-07-21 DIAGNOSIS — Z794 Long term (current) use of insulin: Secondary | ICD-10-CM | POA: Diagnosis not present

## 2018-07-21 NOTE — Progress Notes (Signed)
Patient ID: SHERRYE PUGA, female   DOB: [birth year], 85 y.o.   MRN: [id number] Complaint:  Visit Type: Patient returns to my office for continued preventative foot care services. Complaint: Patient states" my nails have grown long and thick and become painful to walk and wear shoes" Patient has been diagnosed with DM with no complications. He presents for preventative foot care services. No changes to ROS.  Patient is taking coumadin.  Podiatric Exam: Vascular: dorsalis pedis and posterior tibial pulses are palpable bilateral. Capillary return is immediate. Temperature gradient is WNL. Skin turgor WNL  Sensorium: Normal Semmes Weinstein monofilament test. Normal tactile sensation bilaterally. Nail Exam: Pt has thick disfigured discolored nails with subungual debris noted bilateral entire nail hallux through fifth toenails Ulcer Exam: There is no evidence of ulcer or pre-ulcerative changes or infection. Orthopedic Exam: Muscle tone and strength are WNL. No limitations in general ROM. No crepitus or effusions noted. Foot type and digits show no abnormalities. Bony prominences are unremarkable. Skin: No Porokeratosis. No infection or ulcers  Diagnosis:  Tinea unguium, Pain in right toe, pain in left toes  Treatment & Plan Procedures and Treatment: Consent by patient was obtained for treatment procedures. The patient understood the discussion of treatment and procedures well. All questions were answered thoroughly reviewed. Debridement of mycotic and hypertrophic toenails, 1 through 5 bilateral and clearing of subungual debris. No ulceration, no infection noted.   Prescribed  Lyrica  . Return Visit-Office Procedure: Patient instructed to return to the office for a follow up visit 3 months for continued evaluation and treatment.    [name] DPM

## 2018-07-22 ENCOUNTER — Telehealth: Payer: Self-pay | Admitting: *Deleted

## 2018-07-22 ENCOUNTER — Encounter: Payer: Self-pay | Admitting: Internal Medicine

## 2018-07-22 ENCOUNTER — Ambulatory Visit: Payer: Medicare Other | Admitting: Internal Medicine

## 2018-07-22 VITALS — BP 126/70 | HR 86 | Ht 63.0 in | Wt 108.0 lb

## 2018-07-22 DIAGNOSIS — I4821 Permanent atrial fibrillation: Secondary | ICD-10-CM | POA: Diagnosis not present

## 2018-07-22 DIAGNOSIS — I10 Essential (primary) hypertension: Secondary | ICD-10-CM | POA: Diagnosis not present

## 2018-07-22 DIAGNOSIS — Z950 Presence of cardiac pacemaker: Secondary | ICD-10-CM

## 2018-07-22 DIAGNOSIS — I442 Atrioventricular block, complete: Secondary | ICD-10-CM

## 2018-07-22 NOTE — Progress Notes (Signed)
Patient Care Team: Antony Contras, MD as PCP - General (Family Medicine)   HPI  [name] is a 85 y.o. female  With PhD in [place] from [employer] for pacemaker followup This had been undertaken for tachybradycardia syndrome. 2012 she underwent AV junction ablation by Dr. Lovena Le.  DATE TEST EF   9/18 Echo   60-65 %           Thromboembolic risk profile includes prior TIA-2, hypertension-1, diabetes-1, gender-1, age-49 for a CHADS-VASc score of 7  She has been on coumadin with prior discussions re NOAC   No bleeding on the warfarin  The patient denies chest pain,  nocturnal dyspnea, orthopnea or peripheral edema.  There have been no palpitations, lightheadedness or syncope.  Mild shortness of breath and fatigue unchanged   Past Medical History:  Diagnosis Date  . CKD (chronic kidney disease), stage III ([place])   . COPD (chronic obstructive pulmonary disease) ([place])   . Depression   . Diabetes mellitus without complication ([place])   . Headache   . Hypertension   . Orthostatic hypotension   . Osteopenia   . Pacemaker   . 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 Permanent atrial fibrillation    a.  s/p AVN ablation 2010 with complete heart block s/p PPM (gen change 2015, pt preference for Coumadin).  . Protein calorie malnutrition ([place])   . S/P AV nodal ablation   . TIA (transient ischemic attack)     Past Surgical History:  Procedure Laterality Date  . ABLATION  2012   AVN ablation by Dr Lovena Le  . cateracts     eyes  . CYST REMOVAL TRUNK     from breast  . PACEMAKER INSERTION    . PERMANENT PACEMAKER GENERATOR CHANGE N/A 07/18/2013   Procedure: PERMANENT PACEMAKER GENERATOR CHANGE;  Surgeon: Deboraha Sprang, MD;  [facility] C[facility] LAB;  Service: Cardiovascular;  Laterality: N/A;    Current Outpatient Medications  Medication Sig Dispense Refill  . BD PEN NEEDLE NANO U/F 32G X 4 MM MISC AS DIRECTED USE WITH TOUJEO/ DX  E11.49 IN VITRO  2  . clonazePAM (KLONOPIN) 0.5 MG tablet Take 0.5 mg by mouth at bedtime.  2  . escitalopram (LEXAPRO) 20 MG tablet Take 20 mg by mouth daily.  0  . hydrochlorothiazide (HYDRODIURIL) 12.5 MG tablet TAKE 0.5 TABLETS (6.25 MG TOTAL) BY MOUTH DAILY. 45 tablet 2  . Insulin Glargine (TOUJEO SOLOSTAR) 300 UNIT/ML SOPN Inject 4 Units/day into the skin.    Marland Kitchen losartan (COZAAR) 100 MG tablet Take 100 mg by mouth daily.    . magic mouthwash SOLN SWISH AND SWALLOW 1 TEASPOONFUL BY MOUTH EVERY 6 HOURS AS NEEDED  0  . Melatonin 5 MG TABS Take 5 mg by mouth daily.    . mirtazapine (REMERON) 7.5 MG tablet Take 7.5 mg by mouth at bedtime.     . Multiple Vitamin (MULTIVITAMIN) tablet Take 1 tablet by mouth daily.    . pregabalin (LYRICA) 50 MG capsule Take 1 capsule (50 mg total) by mouth daily. 30 capsule 2  . warfarin (COUMADIN) 1 MG tablet TAKE AS DIRECTED BY COUMADIN CLINIC 180 tablet 1   No current facility-administered medications for this visit.     Allergies  Allergen Reactions  . Eliquis [Apixaban] Other (See Comments)    Made pt have weak muscles  . Gabapentin     SHAKES AND NAUSEA   .  Rozerem [Ramelteon]     DIZZY AND HEAD THROBBING  . Temazepam     INSOMNIA  . Tikosyn [Dofetilide]     Mouth swells, itching  . Tradjenta [Linagliptin] Diarrhea    Joint Pain, Stomach issues    Review of Systems negative except from HPI and PMH  Physical Exam BP 126/70   Pulse 86   Ht 5\' 3"  (1.6 m)   Wt 108 lb (49 kg)   LMP  (LMP Unknown)   SpO2 93%   BMI 19.13 kg/m  Well developed and well nourished in no acute distress HENT normal Neck supple with JVP-flat Clear Device pocket well healed; without hematoma or erythema.  There is no tethering  Regular rate and rhythm, no   gallop 2/6 murmur Abd-soft with active BS No Clubbing cyanosis  edema Skin-warm and dry A & Oriented  Grossly normal sensory and motor function  ECG atrial fib with V pacing   Assessment and   Plan  Atrial fibrillation-permanent   Complete heart block s/p AV ablation  Hypertension   Grade 4 renal insufficiency  Pacemaker Medtronic  The patient's device was interrogated.  The information was reviewed. No changes were made in the programming.      BP well controlled   On Anticoagulation;  No bleeding issues   We discussed the use of the NOACs compared to Coumadin. We briefly reviewed the data of at least comparability in stroke prevention, bleeding and outcome. We discussed some of the new once wherein somewhat associated with decreased ischemic stroke risk, one to be taken daily, and has been shown to be comparable and bleeding risk to aspirin.  We also discussed bleeding associated with warfarin as well as NOACs and a wall bleeding as a complication of all these drugs intracranial bleeding is more frequently associated with warfarin then the NOACs and a GI bleeding is more commonly associated with the latter  She had previously not tolerated apixaban.  When she comes in for her next INR check, she is agreeable to trying rivaroxaban.  We will need to check her creatinine at that time.  The last creatinine in our records is 7/18 with a GFR of 24  We spent more than 50% of our >25 min visit in face to face counseling regarding the above

## 2018-07-22 NOTE — Telephone Encounter (Signed)
Handwritten Prescription was given to patient signed by Dr Prudence Davidson for Lyrica-50mg  on 07/21/2018

## 2018-07-22 NOTE — Patient Instructions (Signed)

## 2018-07-25 DIAGNOSIS — W19XXXA Unspecified fall, initial encounter: Secondary | ICD-10-CM | POA: Diagnosis not present

## 2018-07-25 DIAGNOSIS — M25561 Pain in right knee: Secondary | ICD-10-CM | POA: Diagnosis not present

## 2018-07-27 LAB — CUP PACEART INCLINIC DEVICE CHECK
Battery Impedance: 404 Ohm
Battery Remaining Longevity: 96 mo
Battery Voltage: 2.78 V
Brady Statistic RV Percent Paced: 100 %
Date Time Interrogation Session: 20200305184853
Implantable Lead Implant Date: 20080416
Implantable Lead Implant Date: 20080416
Implantable Lead Location: 753859
Implantable Lead Location: 753860
Implantable Lead Model: 4076
Implantable Lead Model: 5076
Implantable Pulse Generator Implant Date: 20150302
Lead Channel Impedance Value: 532 Ohm
Lead Channel Impedance Value: 67 Ohm
Lead Channel Pacing Threshold Amplitude: 0.75 V
Lead Channel Pacing Threshold Pulse Width: 0.4 ms
Lead Channel Setting Pacing Amplitude: 2.5 V
Lead Channel Setting Pacing Pulse Width: 0.4 ms
Lead Channel Setting Sensing Sensitivity: 5.6 mV

## 2018-08-02 ENCOUNTER — Other Ambulatory Visit: Payer: Self-pay

## 2018-08-02 ENCOUNTER — Ambulatory Visit: Payer: Medicare Other | Admitting: Pharmacist

## 2018-08-02 DIAGNOSIS — Z5181 Encounter for therapeutic drug level monitoring: Secondary | ICD-10-CM | POA: Diagnosis not present

## 2018-08-02 DIAGNOSIS — I4891 Unspecified atrial fibrillation: Secondary | ICD-10-CM

## 2018-08-02 LAB — POCT INR: INR: 2.7 (ref 2.0–3.0)

## 2018-08-02 NOTE — Patient Instructions (Signed)
Description   Continue same dosage 2 tablets everyday except 1 tablet on Fridays. Recheck INR in 4 weeks. Remain consistent in eating dark leafy greens. Call [phone] with any medication changes or concerns.

## 2018-08-13 DIAGNOSIS — K1379 Other lesions of oral mucosa: Secondary | ICD-10-CM | POA: Diagnosis not present

## 2018-08-16 ENCOUNTER — Other Ambulatory Visit: Payer: Self-pay

## 2018-08-16 ENCOUNTER — Ambulatory Visit (INDEPENDENT_AMBULATORY_CARE_PROVIDER_SITE_OTHER): Payer: Medicare Other | Admitting: *Deleted

## 2018-08-16 DIAGNOSIS — I4891 Unspecified atrial fibrillation: Secondary | ICD-10-CM | POA: Diagnosis not present

## 2018-08-16 DIAGNOSIS — I442 Atrioventricular block, complete: Secondary | ICD-10-CM

## 2018-08-16 LAB — CUP PACEART REMOTE DEVICE CHECK
Battery Impedance: 430 Ohm
Battery Remaining Longevity: 93 mo
Battery Voltage: 2.78 V
Brady Statistic RV Percent Paced: 100 %
Date Time Interrogation Session: 20200330114455
Implantable Lead Implant Date: 20080416
Implantable Lead Implant Date: 20080416
Implantable Lead Location: 753859
Implantable Lead Location: 753860
Implantable Lead Model: 4076
Implantable Lead Model: 5076
Implantable Pulse Generator Implant Date: 20150302
Lead Channel Impedance Value: 517 Ohm
Lead Channel Impedance Value: 67 Ohm
Lead Channel Pacing Threshold Amplitude: 0.625 V
Lead Channel Pacing Threshold Pulse Width: 0.4 ms
Lead Channel Setting Pacing Amplitude: 2.5 V
Lead Channel Setting Pacing Pulse Width: 0.4 ms
Lead Channel Setting Sensing Sensitivity: 5.6 mV

## 2018-08-23 NOTE — Progress Notes (Signed)
Remote pacemaker transmission.   

## 2018-08-27 ENCOUNTER — Telehealth: Payer: Self-pay

## 2018-08-27 NOTE — Telephone Encounter (Signed)

## 2018-08-27 NOTE — Telephone Encounter (Signed)
lmom for prescreen/drive thru 

## 2018-08-30 ENCOUNTER — Ambulatory Visit (INDEPENDENT_AMBULATORY_CARE_PROVIDER_SITE_OTHER): Payer: Medicare Other | Admitting: Pharmacist

## 2018-08-30 ENCOUNTER — Other Ambulatory Visit: Payer: Self-pay

## 2018-08-30 DIAGNOSIS — I4891 Unspecified atrial fibrillation: Secondary | ICD-10-CM

## 2018-08-30 DIAGNOSIS — Z5181 Encounter for therapeutic drug level monitoring: Secondary | ICD-10-CM | POA: Diagnosis not present

## 2018-08-30 LAB — POCT INR: INR: 2.4 (ref 2.0–3.0)

## 2018-08-30 MED ORDER — RIVAROXABAN 15 MG PO TABS: 15.0000 mg | ORAL_TABLET | Freq: Every day | ORAL | 0 refills | Status: DC

## 2018-08-30 NOTE — Progress Notes (Signed)
Pt was started on Xarelto 15mg  daily for Afib on April 13th, 2020.   Reviewed patients medication list.  Pt is not currently on any combined P-gp and strong CYP3A4 inhibitors/inducers (ketoconazole, traconazole, ritonavir, carbamazepine, phenytoin, rifampin, St. John's wort).  Reviewed labs.  SCr 1.3, Weight 108 lbs, CrCl- 24 ml/min.  Dose is appropriate based on CrCl.     A full discussion of the nature of anticoagulants has been carried out.  A benefit/risk analysis has been presented to the patient, so that they understand the justification for choosing anticoagulation with Xarelto at this time.  The need for compliance is stressed.  Pt is aware to take the medication once daily with the largest meal of the day.  Side effects of potential bleeding are discussed, including unusual colored urine or stools, coughing up blood or coffee ground emesis, nose bleeds or serious fall or head trauma.  Discussed signs and symptoms of stroke. The patient should avoid any OTC items containing aspirin or ibuprofen.  Avoid alcohol consumption.   Call if any signs of abnormal bleeding.  Discussed financial obligations and resolved any difficulty in obtaining medication.

## 2018-09-13 ENCOUNTER — Telehealth: Payer: Self-pay | Admitting: Pharmacist

## 2018-09-13 NOTE — Telephone Encounter (Signed)
Pt called concerned about kidneys and Xarelto. She is worried that Xarelto is worsening her kidney function as she had difficulty urinating yesterday. Advised that Xarelto should not affect kidney function and we monitor only to adjust dose. She states understanding and will follow up with primary doctor on urination if persists.

## 2018-09-16 ENCOUNTER — Encounter (HOSPITAL_COMMUNITY): Payer: Self-pay | Admitting: Emergency Medicine

## 2018-09-16 ENCOUNTER — Other Ambulatory Visit: Payer: Self-pay

## 2018-09-16 ENCOUNTER — Emergency Department (HOSPITAL_COMMUNITY): Payer: Medicare Other

## 2018-09-16 ENCOUNTER — Emergency Department (HOSPITAL_COMMUNITY)
Admission: EM | Admit: 2018-09-16 | Discharge: 2018-09-16 | Disposition: A | Discharge status: Home / Self Care | Payer: Medicare Other | Attending: Emergency Medicine | Admitting: Emergency Medicine

## 2018-09-16 DIAGNOSIS — Z8673 Personal history of transient ischemic attack (TIA), and cerebral infarction without residual deficits: Secondary | ICD-10-CM | POA: Diagnosis not present

## 2018-09-16 DIAGNOSIS — Z7901 Long term (current) use of anticoagulants: Secondary | ICD-10-CM | POA: Diagnosis not present

## 2018-09-16 DIAGNOSIS — Y9301 Activity, walking, marching and hiking: Secondary | ICD-10-CM | POA: Diagnosis not present

## 2018-09-16 DIAGNOSIS — Z950 Presence of cardiac pacemaker: Secondary | ICD-10-CM | POA: Insufficient documentation

## 2018-09-16 DIAGNOSIS — Z79899 Other long term (current) drug therapy: Secondary | ICD-10-CM | POA: Diagnosis not present

## 2018-09-16 DIAGNOSIS — S51012A Laceration without foreign body of left elbow, initial encounter: Secondary | ICD-10-CM | POA: Diagnosis not present

## 2018-09-16 DIAGNOSIS — W101XXA Fall (on)(from) sidewalk curb, initial encounter: Secondary | ICD-10-CM | POA: Diagnosis not present

## 2018-09-16 DIAGNOSIS — Z794 Long term (current) use of insulin: Secondary | ICD-10-CM | POA: Insufficient documentation

## 2018-09-16 DIAGNOSIS — Y929 Unspecified place or not applicable: Secondary | ICD-10-CM | POA: Insufficient documentation

## 2018-09-16 DIAGNOSIS — Z23 Encounter for immunization: Secondary | ICD-10-CM | POA: Diagnosis not present

## 2018-09-16 DIAGNOSIS — N183 Chronic kidney disease, stage 3 (moderate): Secondary | ICD-10-CM | POA: Insufficient documentation

## 2018-09-16 DIAGNOSIS — S52032A Displaced fracture of olecranon process with intraarticular extension of left ulna, initial encounter for closed fracture: Secondary | ICD-10-CM | POA: Insufficient documentation

## 2018-09-16 DIAGNOSIS — F17210 Nicotine dependence, cigarettes, uncomplicated: Secondary | ICD-10-CM | POA: Diagnosis not present

## 2018-09-16 DIAGNOSIS — E1122 Type 2 diabetes mellitus with diabetic chronic kidney disease: Secondary | ICD-10-CM | POA: Insufficient documentation

## 2018-09-16 DIAGNOSIS — J449 Chronic obstructive pulmonary disease, unspecified: Secondary | ICD-10-CM | POA: Diagnosis not present

## 2018-09-16 DIAGNOSIS — I129 Hypertensive chronic kidney disease with stage 1 through stage 4 chronic kidney disease, or unspecified chronic kidney disease: Secondary | ICD-10-CM | POA: Insufficient documentation

## 2018-09-16 DIAGNOSIS — S52022A Displaced fracture of olecranon process without intraarticular extension of left ulna, initial encounter for closed fracture: Secondary | ICD-10-CM

## 2018-09-16 DIAGNOSIS — M25522 Pain in left elbow: Secondary | ICD-10-CM | POA: Diagnosis present

## 2018-09-16 MED ORDER — TETANUS-DIPHTH-ACELL PERTUSSIS 5-2.5-18.5 LF-MCG/0.5 IM SUSP
0.5000 mL | Freq: Once | INTRAMUSCULAR | Status: AC
  Administered 2018-09-16: 0.5 mL via INTRAMUSCULAR
  Filled 2018-09-16: qty 0.5

## 2018-09-16 MED ORDER — ACETAMINOPHEN 500 MG PO TABS
1000.0000 mg | ORAL_TABLET | Freq: Once | ORAL | Status: AC
  Administered 2018-09-16: 1000 mg via ORAL
  Filled 2018-09-16: qty 2

## 2018-09-16 MED ORDER — MORPHINE SULFATE 15 MG PO TABS: 7.5000 mg | ORAL_TABLET | ORAL | 0 refills | Status: DC | PRN

## 2018-09-16 NOTE — ED Notes (Signed)
Pt d/c home per MD order. Discharge summary reviewed with pt. Pt verbalizes understanding. Verbalizes understanding of follow up care. Ambulatory off unit, no distress noted. Pt husband here for discharge ride home.

## 2018-09-16 NOTE — Discharge Instructions (Signed)
Return for worsening pain or numbness.  Call the ortho in the morning for an appointment.  Try and elevate this above the level of your heart.

## 2018-09-16 NOTE — ED Notes (Signed)
ED provider at bedside.

## 2018-09-16 NOTE — ED Notes (Signed)
Ortho at bedside.

## 2018-09-16 NOTE — ED Provider Notes (Signed)
[facility] DEPT Provider Note   CSN: [id number] Arrival date & time: 09/16/18  1520    History   Chief Complaint Chief Complaint  Patient presents with  . Fall  . Elbow Pain    left    HPI [name] is a 85 y.o. female.     85 yo F with a chief complaint of left elbow pain.  Patient states that she was trying to get the mail and she stepped up on the curb and lost her balance and landed on a flexed elbow.  She denies any other injury from the fall.  Complaining of severe pain to the left elbow with swelling.  States it hurts to move it but she is able to.  She denies pain to the shoulder or the wrist.  Denies head injury or loss consciousness.  Denies neck pain or back pain.  The history is provided by the patient.  Fall  This is a new problem. The problem occurs constantly. The problem has not changed since onset.Pertinent negatives include no chest pain, no abdominal pain, no headaches and no shortness of breath. The symptoms are aggravated by bending and twisting. Nothing relieves the symptoms. She has tried nothing for the symptoms. The treatment provided no relief.    Past Medical History:  Diagnosis Date  . CKD (chronic kidney disease), stage III ([place])   . COPD (chronic obstructive pulmonary disease) ([place])   . Depression   . Diabetes mellitus without complication ([place])   . Headache   . Hypertension   . Orthostatic hypotension   . Osteopenia   . Pacemaker   . 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 Permanent atrial fibrillation    a.  s/p AVN ablation 2010 with complete heart block s/p PPM (gen change 2015, pt preference for Coumadin).  . Protein calorie malnutrition ([place])   . S/P AV nodal ablation   . TIA (transient ischemic attack)     Patient Active Problem List   Diagnosis Date Noted  . Chronic anticoagulation 02/10/2014  . Pure hypercholesterolemia 02/10/2014  . Encounter for  therapeutic drug monitoring 06/15/2013  . Atrioventricular block, complete ([place]) 06/07/2013  . Pacemaker-Medtronic 06/07/2013  . Atrial fibrillation ([place]) 02/24/2013    Past Surgical History:  Procedure Laterality Date  . ABLATION  2012   AVN ablation by Dr Lovena Le  . cateracts     eyes  . CYST REMOVAL TRUNK     from breast  . PACEMAKER INSERTION    . PERMANENT PACEMAKER GENERATOR CHANGE N/A 07/18/2013   Procedure: PERMANENT PACEMAKER GENERATOR CHANGE;  Surgeon: Deboraha Sprang, MD;  Location: [facility] CATH LAB;  Service: Cardiovascular;  Laterality: N/A;     OB History   No obstetric history on file.      Home Medications    Prior to Admission medications   Medication Sig Start Date End Date Taking? Authorizing Provider  BD PEN NEEDLE NANO U/F 32G X 4 MM MISC AS DIRECTED USE WITH TOUJEO/ DX E11.49 IN VITRO 01/21/18   [provider]  clonazePAM (KLONOPIN) 0.5 MG tablet Take 0.5 mg by mouth at bedtime. 01/02/17   [provider]  escitalopram (LEXAPRO) 20 MG tablet Take 20 mg by mouth daily. 02/10/15   [provider]  hydrochlorothiazide (HYDRODIURIL) 12.5 MG tablet TAKE 0.5 TABLETS (6.25 MG TOTAL) BY MOUTH DAILY. 12/28/17   [name], MD  Insulin Glargine (TOUJEO SOLOSTAR) 300 UNIT/ML SOPN  Inject 4 Units/day into the skin.    [provider]  losartan (COZAAR) 100 MG tablet Take 100 mg by mouth daily.    [provider]  magic mouthwash SOLN SWISH AND SWALLOW 1 TEASPOONFUL BY MOUTH EVERY 6 HOURS AS NEEDED 03/08/18   [provider]  Melatonin 5 MG TABS Take 5 mg by mouth daily.    [provider]  mirtazapine (REMERON) 7.5 MG tablet Take 7.5 mg by mouth at bedtime.     [provider]  morphine (MSIR) 15 MG tablet Take 0.5 tablets (7.5 mg total) by mouth every 4 (four) hours as needed for severe pain. 09/16/18   [name], DO  Multiple Vitamin (MULTIVITAMIN) tablet Take 1 tablet by mouth daily.    [provider]  pregabalin (LYRICA) 50 MG capsule Take 1 capsule (50 mg total) by mouth daily. 01/19/18   [name], DPM  PREVIDENT 5000 DRY MOUTH 1.1 % GEL dental gel Take 1 application by mouth See admin instructions. Use to brush teeth every day, replacement for regular toothpaste 08/23/18   [provider]  Rivaroxaban (XARELTO) 15 MG TABS tablet Take 1 tablet (15 mg total) by mouth daily with supper. 08/30/18   Deboraha Sprang, MD    Family History Family History  Problem Relation Age of Onset  . Stroke Father     Social History Social History   Tobacco Use  . Smoking status: Current Every Day Smoker    Packs/day: 1.00  . Smokeless tobacco: Never Used  . Tobacco comment: 03/05/16 1/2 - 1 ppd  Substance Use Topics  . Alcohol use: No    Alcohol/week: 0.0 standard drinks  . Drug use: No     Allergies   Eliquis [apixaban]; Gabapentin; Rozerem [ramelteon]; Temazepam; Tikosyn [dofetilide]; and Tradjenta [linagliptin]   Review of Systems Review of Systems  Constitutional: Negative for chills and fever.  HENT: Negative for congestion and rhinorrhea.   Eyes: Negative for redness and visual disturbance.  Respiratory: Negative for shortness of breath and wheezing.   Cardiovascular: Negative for chest pain and palpitations.  Gastrointestinal: Negative for abdominal pain, nausea and vomiting.  Genitourinary: Negative for dysuria and urgency.  Musculoskeletal: Positive for arthralgias. Negative for myalgias.  Skin: Negative for pallor and wound.  Neurological: Negative for dizziness and headaches.     Physical Exam Updated Vital Signs BP (!) 156/84 (BP Location: Right Arm)   Pulse 77   Temp 98.3 F (36.8 C) (Oral)   Resp 16   LMP  (LMP Unknown)   SpO2 97%   Physical Exam Vitals signs and nursing note reviewed.  Constitutional:      General: She is not in acute distress.    Appearance: She is well-developed. She is not diaphoretic.  HENT:     Head:  Normocephalic and atraumatic.  Eyes:     Pupils: Pupils are equal, round, and reactive to light.  Neck:     Musculoskeletal: Normal range of motion and neck supple.  Cardiovascular:     Rate and Rhythm: Normal rate and regular rhythm.     Heart sounds: No murmur. No friction rub. No gallop.   Pulmonary:     Effort: Pulmonary effort is normal.     Breath sounds: No wheezing or rales.  Abdominal:     General: There is no distension.     Palpations: Abdomen is soft.     Tenderness: There is no abdominal tenderness.  Musculoskeletal:  General: Swelling and tenderness present.     Comments: Pulse motor and sensation is intact distally.  The patient has significant swelling to the olecranon.  I am able to range this without significant pain.  She has no pain at the wrist or the shoulder.  She has a small skin tear in the same region.  Skin:    General: Skin is warm and dry.  Neurological:     Mental Status: She is alert and oriented to person, place, and time.  Psychiatric:        Behavior: Behavior normal.      ED Treatments / Results  Labs (all labs ordered are listed, but only abnormal results are displayed) Labs Reviewed - No data to display  EKG None  Radiology Dg Elbow Complete Left  Result Date: 09/16/2018 CLINICAL DATA:  [place] while going to the mailbox today, LEFT elbow pain EXAM: LEFT ELBOW - COMPLETE 3+ VIEW COMPARISON:  None FINDINGS: Osseous demineralization. Radiocapitellar joint space preserved. Comminuted displaced olecranon fracture with significant overlying soft tissue swelling and large joint effusion. Humerus and radius both appear intact and normally aligned. No dislocation seen. IMPRESSION: Comminuted displaced intra-articular LEFT olecranon fracture with large joint effusion and significant soft tissue swelling. Electronically Signed   By: Lavonia Dana M.D.   On: 09/16/2018 16:34    Procedures Procedures (including critical care time)  Medications  Ordered in ED Medications  Tdap (BOOSTRIX) injection 0.5 mL (0.5 mLs Intramuscular Given 09/16/18 1639)  acetaminophen (TYLENOL) tablet 1,000 mg (1,000 mg Oral Given 09/16/18 1634)     Initial Impression / Assessment and Plan / ED Course  I have reviewed the triage vital signs and the nursing notes.  Pertinent labs & imaging results that were available during my care of the patient were reviewed by me and considered in my medical decision making (see chart for details).        85 yo F with a chief complaint of left elbow pain after fall.  Will use Steri-Strips.  Plain film with olecranon fracture will discuss with Ortho.  I discussed case with Dr Erlinda Hong, ortho, recommended long arm splint and follow up in the office.    5:39 PM:  I have discussed the diagnosis/risks/treatment options with the patient and believe the pt to be eligible for discharge home to follow-up with Ortho, PCP. We also discussed returning to the ED immediately if new or worsening sx occur. We discussed the sx which are most concerning (e.g., sudden worsening pain, fever, inability to tolerate by mouth) that necessitate immediate return. Medications administered to the patient during their visit and any new prescriptions provided to the patient are listed below.  Medications given during this visit Medications  Tdap (BOOSTRIX) injection 0.5 mL (0.5 mLs Intramuscular Given 09/16/18 1639)  acetaminophen (TYLENOL) tablet 1,000 mg (1,000 mg Oral Given 09/16/18 1634)     The patient appears reasonably screen and/or stabilized for discharge and I doubt any other medical condition or other [facility] requiring further screening, evaluation, or treatment in the ED at this time prior to discharge.     Final Clinical Impressions(s) / ED Diagnoses   Final diagnoses:  Olecranon fracture, left, closed, initial encounter  Skin tear of elbow without complication, left, initial encounter    ED Discharge Orders         Ordered     morphine (MSIR) 15 MG tablet  Every 4 hours PRN     09/16/18 1738  [name], DO 09/16/18 1739

## 2018-09-16 NOTE — ED Notes (Signed)
Patient transported to X-ray 

## 2018-09-16 NOTE — ED Triage Notes (Signed)
Pt reports she was getting mail at her mailbox today when she missed the curb and fell. Pt has pain at left elbow. Reports takes Nutritional therapist.

## 2018-09-20 ENCOUNTER — Other Ambulatory Visit: Payer: Self-pay

## 2018-09-20 ENCOUNTER — Telehealth: Payer: Self-pay | Admitting: Orthopaedic Surgery

## 2018-09-20 ENCOUNTER — Ambulatory Visit (INDEPENDENT_AMBULATORY_CARE_PROVIDER_SITE_OTHER): Payer: Medicare Other | Admitting: Orthopaedic Surgery

## 2018-09-20 ENCOUNTER — Encounter: Payer: Self-pay | Admitting: Orthopaedic Surgery

## 2018-09-20 DIAGNOSIS — S52022A Displaced fracture of olecranon process without intraarticular extension of left ulna, initial encounter for closed fracture: Secondary | ICD-10-CM | POA: Diagnosis not present

## 2018-09-20 MED ORDER — TRAMADOL HCL 50 MG PO TABS: ORAL_TABLET | ORAL | 0 refills | Status: DC

## 2018-09-20 NOTE — Telephone Encounter (Signed)
Can you call in tramadol 50mg  1-2 q6-8 #30

## 2018-09-20 NOTE — Telephone Encounter (Signed)
Called into pharm  

## 2018-09-20 NOTE — Telephone Encounter (Signed)
Patient aware.

## 2018-09-20 NOTE — Telephone Encounter (Signed)
See message below °

## 2018-09-20 NOTE — Addendum Note (Signed)
Addended by: Precious Bard on: 09/20/2018 10:29 AM   Modules accepted: Orders

## 2018-09-20 NOTE — Progress Notes (Signed)
Office Visit Note   Patient: [name]           Date of Birth: [birth year]           MRN: [id number] Visit Date: 09/20/2018              Requested by: Antony Contras, MD [address], [place] [id number] PCP: Antony Contras, MD   Assessment & Plan: Visit Diagnoses:  1. Closed fracture of left olecranon process, initial encounter     Plan: Impression is displaced and comminuted left olecranon fracture with significant soft tissue swelling and bruising with fracture blisters.  Currently these soft tissues are not ready for definitive fixation.  I decompressed the fracture blisters and placed a large Xeroform on top and a large splint was reapplied.  She is to keep this elevated at all times.  She will need a urgent referral to Dr. Marlou Porch for preoperative cardiac clearance.  We will have to delay the surgery for at least a week to allow the soft tissues to improve.  She is on Xarelto for atrial fibrillation which she will have to stop prior to the surgery.  Questions encouraged and answered today.  I would like to see her back next week to recheck her skin.  Follow-Up Instructions: Return in about 1 week (around 09/27/2018) for recheck of soft tissues.   Orders:  No orders of the defined types were placed in this encounter.  No orders of the defined types were placed in this encounter.     Procedures: No procedures performed   Clinical Data: No additional findings.   Subjective: Chief Complaint  Patient presents with  . Left Elbow - Pain    [name] is 85 year old female who sustained a left olecranon fracture this past weekend as a result of a mechanical fall at home.  She lives with her husband.  She is right-hand dominant.  She has a pacemaker and takes Xarelto for atrial fibrillation.  She denies any other injuries.  She does have significant swelling to her left upper extremity.  She denies any numbness and tingling.  She does endorse moderate pain at  baseline.  She was evaluated in the ED and provisionally splinted and given follow-up with [place] today.   Review of Systems  Constitutional: Negative.   HENT: Negative.   Eyes: Negative.   Respiratory: Negative.   Cardiovascular: Negative.   Endocrine: Negative.   Musculoskeletal: Negative.   Neurological: Negative.   Hematological: Negative.   Psychiatric/Behavioral: Negative.   All other systems reviewed and are negative.    Objective: Vital Signs: LMP  (LMP Unknown)   Physical Exam Vitals signs and nursing note reviewed.  Constitutional:      Appearance: She is well-developed.  HENT:     Head: Normocephalic and atraumatic.  Neck:     Musculoskeletal: Neck supple.  Pulmonary:     Effort: Pulmonary effort is normal.  Abdominal:     Palpations: Abdomen is soft.  Skin:    General: Skin is warm.     Capillary Refill: Capillary refill takes less than 2 seconds.  Neurological:     Mental Status: She is alert and oriented to person, place, and time.  Psychiatric:        Behavior: Behavior normal.        Thought Content: Thought content normal.        Judgment: Judgment normal.     Ortho Exam Left elbow exam shows significant ecchymosis  and fracture blisters with a skin tear.  No exposed bone.  Neurovascular intact distally.  She has dorsal swelling and ecchymosis of the left hand. Specialty Comments:  No specialty comments available.  Imaging: No results found.   PMFS History: Patient Active Problem List   Diagnosis Date Noted  . Closed fracture of left olecranon process, initial encounter 09/20/2018  . Chronic anticoagulation 02/10/2014  . Pure hypercholesterolemia 02/10/2014  . Encounter for therapeutic drug monitoring 06/15/2013  . Atrioventricular block, complete ([place]) 06/07/2013  . Pacemaker-Medtronic 06/07/2013  . Atrial fibrillation ([place]) 02/24/2013   Past Medical History:  Diagnosis Date  . CKD (chronic kidney disease), stage III ([place])   . COPD  (chronic obstructive pulmonary disease) ([place])   . Depression   . Diabetes mellitus without complication ([place])   . Headache   . Hypertension   . Orthostatic hypotension   . Osteopenia   . Pacemaker   . 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 Permanent atrial fibrillation    a.  s/p AVN ablation 2010 with complete heart block s/p PPM (gen change 2015, pt preference for Coumadin).  . Protein calorie malnutrition ([place])   . S/P AV nodal ablation   . TIA (transient ischemic attack)     Family History  Problem Relation Age of Onset  . Stroke Father     Past Surgical History:  Procedure Laterality Date  . ABLATION  2012   AVN ablation by Dr Lovena Le  . cateracts     eyes  . CYST REMOVAL TRUNK     from breast  . PACEMAKER INSERTION    . 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 Smoking status: Current Every Day Smoker    Packs/day: 1.00  . Smokeless tobacco: Never Used  . Tobacco comment: 03/05/16 1/2 - 1 ppd  Substance and Sexual Activity  . Alcohol use: No    Alcohol/week: 0.0 standard drinks  . Drug use: No  . Sexual activity: Not on file

## 2018-09-20 NOTE — Telephone Encounter (Signed)
Patient called asked if she can get something for pain. [facility] [address]. The number to contact patient is [phone]

## 2018-09-21 ENCOUNTER — Telehealth: Payer: Self-pay | Admitting: Nurse Practitioner

## 2018-09-21 NOTE — Telephone Encounter (Signed)
   [facility] Pre-operative Risk Assessment    Request for surgical clearance:  1. What type of surgery is being performed? ORIF Left Olecranon  2. When is this surgery scheduled? TBD  3. What type of clearance is required (medical clearance vs. Pharmacy clearance to hold med vs. Both)? Both  4. Are there any medications that need to be held prior to surgery and how long? Stop Xarelto for 3 days prior to surgery  5. Practice name and name of physician performing surgery? [facility]  6. What is your office phone number [phone]   7.   What is your office fax number [phone] Attn: Sherrie  8.   Anesthesia type (None, local, MAC, general) ? Left message for Sherrie to ask about type of anesthesia on 5/5   [name] 09/21/2018, 4:48 PM  _________________________________________________________________   (provider comments below)

## 2018-09-22 NOTE — Telephone Encounter (Signed)
   Primary Cardiologist: Candee Furbish, MD  Chart reviewed as part of pre-operative protocol coverage. Per pharmacy and Dr. Marlou Porch' recommendation, patient can hold xarelto for 3 days prior to her upcoming orthopedic surgery. Patient should restart xarelto as soon as she is cleared to do so by Dr. Erlinda Hong.  I will route this recommendation to the requesting party via Epic fax function and remove from pre-op pool.  Please call with questions.  Abigail Butts, PA-C 09/22/2018, 4:51 PM

## 2018-09-22 NOTE — Telephone Encounter (Signed)
Patient with diagnosis of atrial fibrillation on Xarelto for anticoagulation.    Procedure: ORIF Left Olecranon Date of procedure: TBD  CHADS2-VASc score of 7 (HTN, DM2, TIA x 2, AGEx2, female)  CrCl 24 ml/min Platelet count 165  Due to elevated CHADS2-VASc score including history of TIA, ideally would hold Xarelto as short as possible, however, creatinine clearance is also reduced. Request is to hold Xarelto for 3 days. Will route to MD for further input regarding length of anticoagulation hold.

## 2018-09-22 NOTE — Telephone Encounter (Signed)
Follow up    Sherri with [facility] called to advise of type of anesthesia. She said it will be choice that they are leaving it up to the anesthesiologist to decide.

## 2018-09-22 NOTE — Telephone Encounter (Signed)
Agree with holding for 3 days Xarelto. She will be taking a risk, albeit small cumulatively from a stroke perspective. Candee Furbish, MD

## 2018-09-24 ENCOUNTER — Other Ambulatory Visit: Payer: Self-pay | Admitting: Physician Assistant

## 2018-09-24 ENCOUNTER — Telehealth: Payer: Self-pay | Admitting: Physician Assistant

## 2018-09-24 ENCOUNTER — Telehealth: Payer: Self-pay | Admitting: Orthopaedic Surgery

## 2018-09-24 MED ORDER — HYDROCODONE-ACETAMINOPHEN 5-325 MG PO TABS: 1.0000 | ORAL_TABLET | Freq: Three times a day (TID) | ORAL | 0 refills | Status: DC | PRN

## 2018-09-24 NOTE — Telephone Encounter (Signed)
Patient called and stated that pain medication Tramadol is not helping and needs something stronger.  Please call  Patient to advise. [phone]

## 2018-09-24 NOTE — Telephone Encounter (Signed)
20

## 2018-09-24 NOTE — Telephone Encounter (Signed)
I just called in norco.  Have her stop the tramadol when she starts taking norco

## 2018-09-24 NOTE — Telephone Encounter (Signed)
Called pharm back and they state they need clarification on Quantity. Looks like you put 2 tabs only.  Please advise on how many you would like to actually give her. Thanks.

## 2018-09-24 NOTE — Telephone Encounter (Signed)
Please advise 

## 2018-09-24 NOTE — Telephone Encounter (Signed)
[facility] [place] [facility] needing clarification on Rx (Hydrocodone) The number to contact Caryl Pina is [phone]

## 2018-09-27 ENCOUNTER — Other Ambulatory Visit: Payer: Self-pay

## 2018-09-27 ENCOUNTER — Encounter: Payer: Self-pay | Admitting: Orthopaedic Surgery

## 2018-09-27 ENCOUNTER — Ambulatory Visit: Payer: Medicare Other | Admitting: Orthopaedic Surgery

## 2018-09-27 ENCOUNTER — Telehealth: Payer: Self-pay

## 2018-09-27 DIAGNOSIS — S52022A Displaced fracture of olecranon process without intraarticular extension of left ulna, initial encounter for closed fracture: Secondary | ICD-10-CM | POA: Diagnosis not present

## 2018-09-27 MED ORDER — HYDROCODONE-ACETAMINOPHEN 5-325 MG PO TABS: 1.0000 | ORAL_TABLET | Freq: Three times a day (TID) | ORAL | 0 refills | Status: DC | PRN

## 2018-09-27 NOTE — Telephone Encounter (Signed)
Called to advise. Pharmacist states they only filled the 2 tabs. You will need to resend another Rx for the other tabs . Patient has scheduled appt today. FYI

## 2018-09-27 NOTE — Telephone Encounter (Signed)
Faxed Order to [facility] for Wet to dry dressing BID [facility] [place]

## 2018-09-27 NOTE — Progress Notes (Signed)
Patient ID: ARAH ARO, female   DOB: [birth year], 85 y.o.   MRN: [id number]  [name] comes back today for recheck of her left elbow.  She has no new complaints.  The fracture blisters have resolved and re-epithelialized however she still has a severe skin tear that has not completely healed near the posterior aspect of the elbow.  There is no evidence of infection  Given today's findings I do not feel that the soft tissues are ready for surgery.  We will begin wet-to-dry dressings to the skin tear twice a day to help heal this.  We will recheck everything next Monday.  We have ordered home health nursing for her wound care.

## 2018-09-27 NOTE — Telephone Encounter (Signed)
Just sent in ten more

## 2018-09-29 ENCOUNTER — Other Ambulatory Visit: Payer: Self-pay | Admitting: Internal Medicine

## 2018-09-29 MED ORDER — RIVAROXABAN 15 MG PO TABS: 15.0000 mg | ORAL_TABLET | Freq: Every day | ORAL | 1 refills | Status: DC

## 2018-09-29 NOTE — Telephone Encounter (Signed)
°*  STAT* If patient is at the pharmacy, call can be transferred to refill team.   1. Which medications need to be refilled? (please list name of each medication and dose if known) Rivaroxaban (XARELTO) 15 MG TABS tablet   2. Which pharmacy/location (including street and city if local pharmacy) is medication to be sent to?  [id number] [address], [place] [phone] (Phone) [phone] (Fax)     Significant History/Details    3. Do they need a 30 day or 90 day supply? 90 day

## 2018-09-29 NOTE — Telephone Encounter (Signed)
Pt last saw Dr Caryl Comes 07/22/18, last labs 04/26/18 Creat 1.33 at [place], age 85, weight 49kg, CrCl 23.05, based on CrCl pt is on appropriate dosage of Xarelto 15mg  QD.  Will refill rx.

## 2018-09-30 ENCOUNTER — Telehealth: Payer: Medicare Other | Admitting: Cardiology

## 2018-09-30 ENCOUNTER — Other Ambulatory Visit: Payer: Self-pay

## 2018-09-30 DIAGNOSIS — E1122 Type 2 diabetes mellitus with diabetic chronic kidney disease: Secondary | ICD-10-CM | POA: Diagnosis not present

## 2018-09-30 DIAGNOSIS — I442 Atrioventricular block, complete: Secondary | ICD-10-CM | POA: Diagnosis not present

## 2018-09-30 DIAGNOSIS — M8580 Other specified disorders of bone density and structure, unspecified site: Secondary | ICD-10-CM | POA: Diagnosis not present

## 2018-09-30 DIAGNOSIS — Z7901 Long term (current) use of anticoagulants: Secondary | ICD-10-CM | POA: Diagnosis not present

## 2018-09-30 DIAGNOSIS — Z8673 Personal history of transient ischemic attack (TIA), and cerebral infarction without residual deficits: Secondary | ICD-10-CM | POA: Diagnosis not present

## 2018-09-30 DIAGNOSIS — Z794 Long term (current) use of insulin: Secondary | ICD-10-CM | POA: Diagnosis not present

## 2018-09-30 DIAGNOSIS — Z9181 History of falling: Secondary | ICD-10-CM | POA: Diagnosis not present

## 2018-09-30 DIAGNOSIS — I4819 Other persistent atrial fibrillation: Secondary | ICD-10-CM | POA: Diagnosis not present

## 2018-09-30 DIAGNOSIS — S51012D Laceration without foreign body of left elbow, subsequent encounter: Secondary | ICD-10-CM | POA: Diagnosis not present

## 2018-09-30 DIAGNOSIS — E46 Unspecified protein-calorie malnutrition: Secondary | ICD-10-CM | POA: Diagnosis not present

## 2018-09-30 DIAGNOSIS — J440 Chronic obstructive pulmonary disease with acute lower respiratory infection: Secondary | ICD-10-CM | POA: Diagnosis not present

## 2018-09-30 DIAGNOSIS — Z950 Presence of cardiac pacemaker: Secondary | ICD-10-CM | POA: Diagnosis not present

## 2018-09-30 DIAGNOSIS — S52022D Displaced fracture of olecranon process without intraarticular extension of left ulna, subsequent encounter for closed fracture with routine healing: Secondary | ICD-10-CM | POA: Diagnosis not present

## 2018-09-30 DIAGNOSIS — N183 Chronic kidney disease, stage 3 (moderate): Secondary | ICD-10-CM | POA: Diagnosis not present

## 2018-09-30 DIAGNOSIS — F17210 Nicotine dependence, cigarettes, uncomplicated: Secondary | ICD-10-CM | POA: Diagnosis not present

## 2018-09-30 DIAGNOSIS — I129 Hypertensive chronic kidney disease with stage 1 through stage 4 chronic kidney disease, or unspecified chronic kidney disease: Secondary | ICD-10-CM | POA: Diagnosis not present

## 2018-10-01 ENCOUNTER — Telehealth: Payer: Self-pay | Admitting: Orthopaedic Surgery

## 2018-10-01 DIAGNOSIS — F339 Major depressive disorder, recurrent, unspecified: Secondary | ICD-10-CM | POA: Diagnosis not present

## 2018-10-01 DIAGNOSIS — S52022D Displaced fracture of olecranon process without intraarticular extension of left ulna, subsequent encounter for closed fracture with routine healing: Secondary | ICD-10-CM | POA: Diagnosis not present

## 2018-10-01 NOTE — Telephone Encounter (Signed)
Eric called from [facility] stating pt declined pt. Pt says she is in to much pain to do any physical activity at the moment.  Randall Hiss said if you have any further instructions on what he should do please give him a call at  # [phone]

## 2018-10-04 ENCOUNTER — Other Ambulatory Visit: Payer: Self-pay | Admitting: Physician Assistant

## 2018-10-04 ENCOUNTER — Encounter (HOSPITAL_BASED_OUTPATIENT_CLINIC_OR_DEPARTMENT_OTHER): Payer: Self-pay | Admitting: *Deleted

## 2018-10-04 ENCOUNTER — Other Ambulatory Visit: Payer: Self-pay

## 2018-10-04 ENCOUNTER — Other Ambulatory Visit (HOSPITAL_COMMUNITY)
Admission: RE | Admit: 2018-10-04 | Discharge: 2018-10-04 | Disposition: A | Discharge status: Home / Self Care | Payer: Medicare Other | Source: Ambulatory Visit | Attending: Orthopaedic Surgery | Admitting: Orthopaedic Surgery

## 2018-10-04 ENCOUNTER — Ambulatory Visit: Payer: Medicare Other | Admitting: Orthopaedic Surgery

## 2018-10-04 ENCOUNTER — Encounter: Payer: Self-pay | Admitting: Orthopaedic Surgery

## 2018-10-04 ENCOUNTER — Encounter (HOSPITAL_BASED_OUTPATIENT_CLINIC_OR_DEPARTMENT_OTHER)
Admission: RE | Admit: 2018-10-04 | Discharge: 2018-10-04 | Disposition: A | Discharge status: Home / Self Care | Payer: Medicare Other | Source: Ambulatory Visit

## 2018-10-04 ENCOUNTER — Telehealth: Payer: Self-pay

## 2018-10-04 DIAGNOSIS — Z79899 Other long term (current) drug therapy: Secondary | ICD-10-CM | POA: Diagnosis not present

## 2018-10-04 DIAGNOSIS — Z1159 Encounter for screening for other viral diseases: Secondary | ICD-10-CM | POA: Diagnosis not present

## 2018-10-04 DIAGNOSIS — S52022A Displaced fracture of olecranon process without intraarticular extension of left ulna, initial encounter for closed fracture: Secondary | ICD-10-CM

## 2018-10-04 DIAGNOSIS — I4821 Permanent atrial fibrillation: Secondary | ICD-10-CM | POA: Diagnosis not present

## 2018-10-04 DIAGNOSIS — Z79891 Long term (current) use of opiate analgesic: Secondary | ICD-10-CM | POA: Diagnosis not present

## 2018-10-04 DIAGNOSIS — J449 Chronic obstructive pulmonary disease, unspecified: Secondary | ICD-10-CM | POA: Diagnosis not present

## 2018-10-04 DIAGNOSIS — Z01812 Encounter for preprocedural laboratory examination: Secondary | ICD-10-CM | POA: Diagnosis not present

## 2018-10-04 DIAGNOSIS — Z888 Allergy status to other drugs, medicaments and biological substances status: Secondary | ICD-10-CM | POA: Diagnosis not present

## 2018-10-04 DIAGNOSIS — Z8673 Personal history of transient ischemic attack (TIA), and cerebral infarction without residual deficits: Secondary | ICD-10-CM | POA: Diagnosis not present

## 2018-10-04 DIAGNOSIS — N183 Chronic kidney disease, stage 3 (moderate): Secondary | ICD-10-CM | POA: Diagnosis not present

## 2018-10-04 DIAGNOSIS — E1122 Type 2 diabetes mellitus with diabetic chronic kidney disease: Secondary | ICD-10-CM | POA: Diagnosis not present

## 2018-10-04 DIAGNOSIS — X58XXXA Exposure to other specified factors, initial encounter: Secondary | ICD-10-CM | POA: Diagnosis not present

## 2018-10-04 DIAGNOSIS — Z950 Presence of cardiac pacemaker: Secondary | ICD-10-CM | POA: Diagnosis not present

## 2018-10-04 DIAGNOSIS — I129 Hypertensive chronic kidney disease with stage 1 through stage 4 chronic kidney disease, or unspecified chronic kidney disease: Secondary | ICD-10-CM | POA: Diagnosis not present

## 2018-10-04 DIAGNOSIS — F329 Major depressive disorder, single episode, unspecified: Secondary | ICD-10-CM | POA: Diagnosis not present

## 2018-10-04 DIAGNOSIS — E46 Unspecified protein-calorie malnutrition: Secondary | ICD-10-CM | POA: Diagnosis not present

## 2018-10-04 DIAGNOSIS — F17210 Nicotine dependence, cigarettes, uncomplicated: Secondary | ICD-10-CM | POA: Diagnosis not present

## 2018-10-04 DIAGNOSIS — Z7901 Long term (current) use of anticoagulants: Secondary | ICD-10-CM | POA: Diagnosis not present

## 2018-10-04 DIAGNOSIS — Z794 Long term (current) use of insulin: Secondary | ICD-10-CM | POA: Diagnosis not present

## 2018-10-04 DIAGNOSIS — Z681 Body mass index (BMI) 19 or less, adult: Secondary | ICD-10-CM | POA: Diagnosis not present

## 2018-10-04 LAB — BASIC METABOLIC PANEL
Anion gap: 11 (ref 5–15)
BUN: 16 mg/dL (ref 8–23)
CO2: 31 mmol/L (ref 22–32)
Calcium: 9.5 mg/dL (ref 8.9–10.3)
Chloride: 91 mmol/L — ABNORMAL LOW (ref 98–111)
Creatinine, Ser: 1.36 mg/dL — ABNORMAL HIGH (ref 0.44–1.00)
GFR calc Af Amer: 40 mL/min — ABNORMAL LOW (ref >60)
GFR calc non Af Amer: 35 mL/min — ABNORMAL LOW (ref >60)
Glucose, Bld: 290 mg/dL — ABNORMAL HIGH (ref 70–99)
Potassium: 4.3 mmol/L (ref 3.5–5.1)
Sodium: 133 mmol/L — ABNORMAL LOW (ref 135–145)

## 2018-10-04 LAB — CBC WITH DIFFERENTIAL/PLATELET
Abs Immature Granulocytes: 0.05 10*3/uL (ref 0.00–0.07)
Basophils Absolute: 0.1 10*3/uL (ref 0.0–0.1)
Basophils Relative: 1 %
Eosinophils Absolute: 0.1 10*3/uL (ref 0.0–0.5)
Eosinophils Relative: 1 %
HCT: 38.8 % (ref 36.0–46.0)
Hemoglobin: 12.6 g/dL (ref 12.0–15.0)
Immature Granulocytes: 1 %
Lymphocytes Relative: 12 %
Lymphs Abs: 1.2 10*3/uL (ref 0.7–4.0)
MCH: 31.6 pg (ref 26.0–34.0)
MCHC: 32.5 g/dL (ref 30.0–36.0)
MCV: 97.2 fL (ref 80.0–100.0)
Monocytes Absolute: 0.6 10*3/uL (ref 0.1–1.0)
Monocytes Relative: 6 %
Neutro Abs: 8.2 10*3/uL — ABNORMAL HIGH (ref 1.7–7.7)
Neutrophils Relative %: 79 %
Platelets: 298 10*3/uL (ref 150–400)
RBC: 3.99 MIL/uL (ref 3.87–5.11)
RDW: 12.8 % (ref 11.5–15.5)
WBC: 10.3 10*3/uL (ref 4.0–10.5)
nRBC: 0 % (ref 0.0–0.2)

## 2018-10-04 MED ORDER — TRAMADOL HCL 50 MG PO TABS: ORAL_TABLET | ORAL | 0 refills | Status: DC

## 2018-10-04 NOTE — Telephone Encounter (Signed)
Patient was in the office this morning and would like a refill on Tramadol. [facility] [address]. Thank you

## 2018-10-04 NOTE — Telephone Encounter (Signed)
ok 

## 2018-10-04 NOTE — Telephone Encounter (Signed)
The computers at [facility] will not let me erx.  Can you send in?

## 2018-10-04 NOTE — Progress Notes (Signed)
G2 Gatorade given with instructions to finish by 0645 DOS. PAT labs drawn. All questions and concerns addressed.

## 2018-10-04 NOTE — Telephone Encounter (Signed)
Called into pharm  

## 2018-10-04 NOTE — Progress Notes (Signed)
Office Visit Note   Patient: [name]           Date of Birth: 06-08-1928           MRN: [id number] Visit Date: 10/04/2018              Requested by: Antony Contras, MD [place], [place] [id number] PCP: Antony Contras, MD   Assessment & Plan: Visit Diagnoses:  1. Closed fracture of left olecranon process, initial encounter     Plan: At this point I believe that the soft tissues are ready for ORIF of the left olecranon.  She has been instructed to stop taking her Xarelto at this time.  We will plan on surgery for Thursday at [facility] day surgery center.  We placed Xeroform on her skin tear and reapply the splint.  Questions encouraged and answered.  Follow-Up Instructions: Return for 1 week postop visit.   Orders:  No orders of the defined types were placed in this encounter.  No orders of the defined types were placed in this encounter.     Procedures: No procedures performed   Clinical Data: No additional findings.   Subjective: Chief Complaint  Patient presents with  . Left Elbow - Pain, Follow-up    Shere comes in today for wound check.   Review of Systems   Objective: Vital Signs: LMP  (LMP Unknown)   Physical Exam  Ortho Exam The fracture blisters have completely resolved.  The skin tear has healed significantly and is almost completely healed. Specialty Comments:  No specialty comments available.  Imaging: No results found.   PMFS History: Patient Active Problem List   Diagnosis Date Noted  . Closed fracture of left olecranon process, initial encounter 09/20/2018  . Chronic anticoagulation 02/10/2014  . Pure hypercholesterolemia 02/10/2014  . Encounter for therapeutic drug monitoring 06/15/2013  . Atrioventricular block, complete ([place]) 06/07/2013  . Pacemaker-Medtronic 06/07/2013  . Atrial fibrillation ([place]) 02/24/2013   Past Medical History:  Diagnosis Date  . CKD (chronic kidney disease), stage III ([place])   .  COPD (chronic obstructive pulmonary disease) ([place])   . Depression   . Diabetes mellitus without complication ([place])   . Headache   . Hypertension   . Orthostatic hypotension   . Osteopenia   . Pacemaker   . 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 Permanent atrial fibrillation    a.  s/p AVN ablation 2010 with complete heart block s/p PPM (gen change 2015, pt preference for Coumadin).  . Protein calorie malnutrition ([place])   . S/P AV nodal ablation   . TIA (transient ischemic attack)     Family History  Problem Relation Age of Onset  . Stroke Father     Past Surgical History:  Procedure Laterality Date  . ABLATION  2012   AVN ablation by Dr Lovena Le  . cateracts     eyes  . CYST REMOVAL TRUNK     from breast  . PACEMAKER INSERTION    . 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 Smoking status: Current Every Day Smoker    Packs/day: 1.00  . Smokeless tobacco: Never Used  . Tobacco comment: 03/05/16 1/2 -  1 ppd  Substance and Sexual Activity  . Alcohol use: No    Alcohol/week: 0.0 standard drinks  . Drug use: No  . Sexual activity: Not on file

## 2018-10-04 NOTE — Telephone Encounter (Signed)
Please advise 

## 2018-10-05 ENCOUNTER — Other Ambulatory Visit: Payer: Self-pay

## 2018-10-05 ENCOUNTER — Telehealth: Payer: Medicare Other | Admitting: Cardiology

## 2018-10-05 LAB — NOVEL CORONAVIRUS, NAA (HOSP ORDER, SEND-OUT TO REF LAB; TAT 18-24 HRS): SARS-CoV-2, NAA: NOT DETECTED

## 2018-10-05 NOTE — Progress Notes (Signed)
Labs reviewed by Dr. Eligha Bridegroom, will proceed with surgery as scheduled.

## 2018-10-07 ENCOUNTER — Ambulatory Visit (HOSPITAL_BASED_OUTPATIENT_CLINIC_OR_DEPARTMENT_OTHER): Payer: Medicare Other | Admitting: Anesthesiology

## 2018-10-07 ENCOUNTER — Ambulatory Visit (HOSPITAL_BASED_OUTPATIENT_CLINIC_OR_DEPARTMENT_OTHER)
Admission: RE | Admit: 2018-10-07 | Discharge: 2018-10-07 | Disposition: A | Discharge status: Home / Self Care | Payer: Medicare Other | Attending: Orthopaedic Surgery | Admitting: Orthopaedic Surgery

## 2018-10-07 ENCOUNTER — Other Ambulatory Visit: Payer: Self-pay

## 2018-10-07 ENCOUNTER — Encounter (HOSPITAL_BASED_OUTPATIENT_CLINIC_OR_DEPARTMENT_OTHER): Payer: Self-pay | Admitting: Anesthesiology

## 2018-10-07 ENCOUNTER — Encounter (HOSPITAL_BASED_OUTPATIENT_CLINIC_OR_DEPARTMENT_OTHER)
Admission: RE | Disposition: A | Discharge status: Home / Self Care | Payer: Self-pay | Source: Home / Self Care | Attending: Orthopaedic Surgery

## 2018-10-07 DIAGNOSIS — F329 Major depressive disorder, single episode, unspecified: Secondary | ICD-10-CM | POA: Insufficient documentation

## 2018-10-07 DIAGNOSIS — S52022A Displaced fracture of olecranon process without intraarticular extension of left ulna, initial encounter for closed fracture: Secondary | ICD-10-CM | POA: Diagnosis not present

## 2018-10-07 DIAGNOSIS — Z888 Allergy status to other drugs, medicaments and biological substances status: Secondary | ICD-10-CM | POA: Insufficient documentation

## 2018-10-07 DIAGNOSIS — E1122 Type 2 diabetes mellitus with diabetic chronic kidney disease: Secondary | ICD-10-CM | POA: Diagnosis not present

## 2018-10-07 DIAGNOSIS — E46 Unspecified protein-calorie malnutrition: Secondary | ICD-10-CM | POA: Diagnosis not present

## 2018-10-07 DIAGNOSIS — N183 Chronic kidney disease, stage 3 (moderate): Secondary | ICD-10-CM | POA: Diagnosis not present

## 2018-10-07 DIAGNOSIS — Z681 Body mass index (BMI) 19 or less, adult: Secondary | ICD-10-CM | POA: Insufficient documentation

## 2018-10-07 DIAGNOSIS — I4821 Permanent atrial fibrillation: Secondary | ICD-10-CM | POA: Diagnosis not present

## 2018-10-07 DIAGNOSIS — Z950 Presence of cardiac pacemaker: Secondary | ICD-10-CM | POA: Insufficient documentation

## 2018-10-07 DIAGNOSIS — Z79891 Long term (current) use of opiate analgesic: Secondary | ICD-10-CM | POA: Diagnosis not present

## 2018-10-07 DIAGNOSIS — Z794 Long term (current) use of insulin: Secondary | ICD-10-CM | POA: Diagnosis not present

## 2018-10-07 DIAGNOSIS — I4891 Unspecified atrial fibrillation: Secondary | ICD-10-CM | POA: Diagnosis not present

## 2018-10-07 DIAGNOSIS — X58XXXA Exposure to other specified factors, initial encounter: Secondary | ICD-10-CM | POA: Insufficient documentation

## 2018-10-07 DIAGNOSIS — I129 Hypertensive chronic kidney disease with stage 1 through stage 4 chronic kidney disease, or unspecified chronic kidney disease: Secondary | ICD-10-CM | POA: Insufficient documentation

## 2018-10-07 DIAGNOSIS — Z8673 Personal history of transient ischemic attack (TIA), and cerebral infarction without residual deficits: Secondary | ICD-10-CM | POA: Diagnosis not present

## 2018-10-07 DIAGNOSIS — J449 Chronic obstructive pulmonary disease, unspecified: Secondary | ICD-10-CM | POA: Diagnosis not present

## 2018-10-07 DIAGNOSIS — F17210 Nicotine dependence, cigarettes, uncomplicated: Secondary | ICD-10-CM | POA: Insufficient documentation

## 2018-10-07 DIAGNOSIS — Z79899 Other long term (current) drug therapy: Secondary | ICD-10-CM | POA: Insufficient documentation

## 2018-10-07 DIAGNOSIS — Z7901 Long term (current) use of anticoagulants: Secondary | ICD-10-CM | POA: Diagnosis not present

## 2018-10-07 DIAGNOSIS — I442 Atrioventricular block, complete: Secondary | ICD-10-CM | POA: Diagnosis not present

## 2018-10-07 DIAGNOSIS — E7800 Pure hypercholesterolemia, unspecified: Secondary | ICD-10-CM | POA: Diagnosis not present

## 2018-10-07 HISTORY — PX: ORIF ELBOW FRACTURE: SHX5031

## 2018-10-07 HISTORY — DX: Displaced fracture of olecranon process without intraarticular extension of left ulna, initial encounter for closed fracture: S52.022A

## 2018-10-07 LAB — GLUCOSE, CAPILLARY
Glucose-Capillary: 129 mg/dL — ABNORMAL HIGH (ref 70–99)
Glucose-Capillary: 166 mg/dL — ABNORMAL HIGH (ref 70–99)

## 2018-10-07 SURGERY — OPEN REDUCTION INTERNAL FIXATION (ORIF) ELBOW/OLECRANON FRACTURE
Anesthesia: General | Site: Elbow | Laterality: Left

## 2018-10-07 MED ORDER — LIDOCAINE 2% (20 MG/ML) 5 ML SYRINGE
INTRAMUSCULAR | Status: AC
  Filled 2018-10-07: qty 5

## 2018-10-07 MED ORDER — ONDANSETRON HCL 4 MG/2ML IJ SOLN
INTRAMUSCULAR | Status: DC | PRN
  Administered 2018-10-07: 4 mg via INTRAVENOUS

## 2018-10-07 MED ORDER — BUPIVACAINE HCL (PF) 0.25 % IJ SOLN
INTRAMUSCULAR | Status: DC | PRN
  Administered 2018-10-07: 10 mL

## 2018-10-07 MED ORDER — MEPERIDINE HCL 25 MG/ML IJ SOLN: 6.2500 mg | INTRAMUSCULAR | Status: DC | PRN

## 2018-10-07 MED ORDER — SCOPOLAMINE 1 MG/3DAYS TD PT72: 1.0000 | MEDICATED_PATCH | Freq: Once | TRANSDERMAL | Status: DC | PRN

## 2018-10-07 MED ORDER — FENTANYL CITRATE (PF) 100 MCG/2ML IJ SOLN
25.0000 ug | INTRAMUSCULAR | Status: DC | PRN
  Administered 2018-10-07: 25 ug via INTRAVENOUS

## 2018-10-07 MED ORDER — CEFAZOLIN SODIUM-DEXTROSE 2-4 GM/100ML-% IV SOLN
INTRAVENOUS | Status: AC
  Filled 2018-10-07: qty 100

## 2018-10-07 MED ORDER — OXYCODONE HCL 5 MG PO TABS
5.0000 mg | ORAL_TABLET | Freq: Once | ORAL | Status: AC
  Administered 2018-10-07: 5 mg via ORAL

## 2018-10-07 MED ORDER — ZINC SULFATE 220 (50 ZN) MG PO CAPS: 220.0000 mg | ORAL_CAPSULE | Freq: Every day | ORAL | 0 refills | Status: DC

## 2018-10-07 MED ORDER — FENTANYL CITRATE (PF) 100 MCG/2ML IJ SOLN
INTRAMUSCULAR | Status: AC
  Filled 2018-10-07: qty 2

## 2018-10-07 MED ORDER — FENTANYL CITRATE (PF) 100 MCG/2ML IJ SOLN
50.0000 ug | INTRAMUSCULAR | Status: DC | PRN
  Administered 2018-10-07 (×2): 25 ug via INTRAVENOUS

## 2018-10-07 MED ORDER — DEXAMETHASONE SODIUM PHOSPHATE 10 MG/ML IJ SOLN
INTRAMUSCULAR | Status: AC
  Filled 2018-10-07: qty 1

## 2018-10-07 MED ORDER — LACTATED RINGERS IV SOLN: INTRAVENOUS | Status: DC

## 2018-10-07 MED ORDER — OXYCODONE-ACETAMINOPHEN 5-325 MG PO TABS: 1.0000 | ORAL_TABLET | Freq: Four times a day (QID) | ORAL | 0 refills | Status: DC | PRN

## 2018-10-07 MED ORDER — DEXAMETHASONE SODIUM PHOSPHATE 4 MG/ML IJ SOLN
INTRAMUSCULAR | Status: DC | PRN
  Administered 2018-10-07: 5 mg via INTRAVENOUS

## 2018-10-07 MED ORDER — PROPOFOL 10 MG/ML IV BOLUS
INTRAVENOUS | Status: DC | PRN
  Administered 2018-10-07: 100 mg via INTRAVENOUS

## 2018-10-07 MED ORDER — MIDAZOLAM HCL 2 MG/2ML IJ SOLN: 1.0000 mg | INTRAMUSCULAR | Status: DC | PRN

## 2018-10-07 MED ORDER — LACTATED RINGERS IV SOLN
INTRAVENOUS | Status: DC
  Administered 2018-10-07: 10:00:00 via INTRAVENOUS

## 2018-10-07 MED ORDER — CEFAZOLIN SODIUM-DEXTROSE 2-4 GM/100ML-% IV SOLN
2.0000 g | INTRAVENOUS | Status: AC
  Administered 2018-10-07: 2 g via INTRAVENOUS

## 2018-10-07 MED ORDER — OXYCODONE HCL 5 MG PO TABS
ORAL_TABLET | ORAL | Status: AC
  Filled 2018-10-07: qty 1

## 2018-10-07 MED ORDER — ONDANSETRON HCL 4 MG/2ML IJ SOLN: 4.0000 mg | Freq: Once | INTRAMUSCULAR | Status: DC | PRN

## 2018-10-07 MED ORDER — CALCIUM CARBONATE-VITAMIN D 500-200 MG-UNIT PO TABS: 1.0000 | ORAL_TABLET | Freq: Three times a day (TID) | ORAL | 6 refills | Status: DC

## 2018-10-07 MED ORDER — ONDANSETRON HCL 4 MG/2ML IJ SOLN
INTRAMUSCULAR | Status: AC
  Filled 2018-10-07: qty 2

## 2018-10-07 MED ORDER — ONDANSETRON HCL 4 MG PO TABS: 4.0000 mg | ORAL_TABLET | Freq: Three times a day (TID) | ORAL | 0 refills | Status: DC | PRN

## 2018-10-07 MED ORDER — CHLORHEXIDINE GLUCONATE 4 % EX LIQD: 60.0000 mL | Freq: Once | CUTANEOUS | Status: DC

## 2018-10-07 MED ORDER — LIDOCAINE 2% (20 MG/ML) 5 ML SYRINGE
INTRAMUSCULAR | Status: DC | PRN
  Administered 2018-10-07: 20 mg via INTRAVENOUS

## 2018-10-07 SURGICAL SUPPLY — 78 items
BANDAGE ACE 4X5 VEL STRL LF (GAUZE/BANDAGES/DRESSINGS) ×4 IMPLANT
BIT DRILL 2.0 (BIT) ×1
BIT DRILL 2.5X2.75 QC CALB (BIT) ×2 IMPLANT
BIT DRILL 2XNS DISP SS SM FRAG (BIT) ×1 IMPLANT
BIT DRILL CALIBRATED 2.7 (BIT) ×2 IMPLANT
BIT DRL 2XNS DISP SS SM FRAG (BIT) ×1
BLADE HEX COATED 2.75 (ELECTRODE) ×2 IMPLANT
BLADE SURG 15 STRL LF DISP TIS (BLADE) ×1 IMPLANT
BLADE SURG 15 STRL SS (BLADE) ×1
BNDG COHESIVE 4X5 TAN STRL (GAUZE/BANDAGES/DRESSINGS) ×2 IMPLANT
BNDG ESMARK 4X9 LF (GAUZE/BANDAGES/DRESSINGS) ×2 IMPLANT
CORD BIPOLAR FORCEPS 12FT (ELECTRODE) ×2 IMPLANT
COVER BACK TABLE REUSABLE LG (DRAPES) ×2 IMPLANT
COVER WAND RF STERILE (DRAPES) IMPLANT
CUFF TOURN SGL QUICK 18X3 (MISCELLANEOUS) ×2 IMPLANT
DECANTER SPIKE VIAL GLASS SM (MISCELLANEOUS) IMPLANT
DERMABOND ADVANCED (GAUZE/BANDAGES/DRESSINGS) ×1
DERMABOND ADVANCED .7 DNX12 (GAUZE/BANDAGES/DRESSINGS) ×1 IMPLANT
DRAPE C-ARM 42X72 X-RAY (DRAPES) ×2 IMPLANT
DRAPE EXTREMITY T 121X128X90 (DISPOSABLE) ×2 IMPLANT
DRAPE IMP U-DRAPE 54X76 (DRAPES) ×2 IMPLANT
DRAPE INCISE IOBAN 66X45 STRL (DRAPES) ×2 IMPLANT
DRAPE SURG 17X23 STRL (DRAPES) ×2 IMPLANT
GAUZE SPONGE 4X4 12PLY STRL (GAUZE/BANDAGES/DRESSINGS) ×2 IMPLANT
GAUZE XEROFORM 1X8 LF (GAUZE/BANDAGES/DRESSINGS) ×2 IMPLANT
GLOVE BIOGEL PI IND STRL 7.0 (GLOVE) ×1 IMPLANT
GLOVE BIOGEL PI INDICATOR 7.0 (GLOVE) ×1
GLOVE ECLIPSE 7.0 STRL STRAW (GLOVE) ×2 IMPLANT
GLOVE SKINSENSE NS SZ7.5 (GLOVE) ×1
GLOVE SKINSENSE STRL SZ7.5 (GLOVE) ×1 IMPLANT
GLOVE SURG SYN 7.5  E (GLOVE) ×1
GLOVE SURG SYN 7.5 E (GLOVE) ×1 IMPLANT
GOWN STRL REIN XL XLG (GOWN DISPOSABLE) ×2 IMPLANT
GOWN STRL REUS W/ TWL LRG LVL3 (GOWN DISPOSABLE) ×1 IMPLANT
GOWN STRL REUS W/ TWL XL LVL3 (GOWN DISPOSABLE) ×1 IMPLANT
GOWN STRL REUS W/TWL LRG LVL3 (GOWN DISPOSABLE) ×1
GOWN STRL REUS W/TWL XL LVL3 (GOWN DISPOSABLE) ×1
K-WIRE ACE 1.6X6 (WIRE) ×6
KWIRE ACE 1.6X6 (WIRE) ×3 IMPLANT
MANIFOLD NEPTUNE II (INSTRUMENTS) ×2 IMPLANT
NEEDLE HYPO 25X1 1.5 SAFETY (NEEDLE) IMPLANT
NS IRRIG 1000ML POUR BTL (IV SOLUTION) ×2 IMPLANT
PACK BASIN DAY SURGERY FS (CUSTOM PROCEDURE TRAY) ×2 IMPLANT
PAD CAST 4YDX4 CTTN HI CHSV (CAST SUPPLIES) ×1 IMPLANT
PADDING CAST ABS 4INX4YD NS (CAST SUPPLIES)
PADDING CAST ABS COTTON 4X4 ST (CAST SUPPLIES) IMPLANT
PADDING CAST COTTON 4X4 STRL (CAST SUPPLIES) ×1
PENCIL BUTTON HOLSTER BLD 10FT (ELECTRODE) ×2 IMPLANT
PLATE OLECRANON LRG (Plate) ×2 IMPLANT
SCREW CORTICAL 2.7MM 16MM (Screw) ×2 IMPLANT
SCREW CORTICAL LOW PROF 3.5X20 (Screw) ×2 IMPLANT
SCREW LOCK CORT STAR 3.5X10 (Screw) ×2 IMPLANT
SCREW LOCK CORT STAR 3.5X14 (Screw) ×4 IMPLANT
SCREW LOCK CORT STAR 3.5X22 (Screw) ×2 IMPLANT
SCREW LOCK CORT STAR 3.5X40 (Screw) ×2 IMPLANT
SCREW T15 LP CORT 3.5X65MM NS (Screw) ×2 IMPLANT
SHEET MEDIUM DRAPE 40X70 STRL (DRAPES) ×2 IMPLANT
SLEEVE SCD COMPRESS KNEE MED (MISCELLANEOUS) ×2 IMPLANT
SLING ARM FOAM STRAP LRG (SOFTGOODS) ×2 IMPLANT
SPLINT FIBERGLASS 4X30 (CAST SUPPLIES) ×2 IMPLANT
SPONGE LAP 18X18 RF (DISPOSABLE) ×2 IMPLANT
STOCKINETTE IMPERVIOUS LG (DRAPES) ×2 IMPLANT
STRIP CLOSURE SKIN 1/4X4 (GAUZE/BANDAGES/DRESSINGS) IMPLANT
SUCTION FRAZIER HANDLE 10FR (MISCELLANEOUS) ×1
SUCTION TUBE FRAZIER 10FR DISP (MISCELLANEOUS) ×1 IMPLANT
SUT ETHILON 3 0 PS 1 (SUTURE) ×2 IMPLANT
SUT MNCRL AB 4-0 PS2 18 (SUTURE) IMPLANT
SUT VIC AB 0 CT1 27 (SUTURE) ×1
SUT VIC AB 0 CT1 27XBRD ANBCTR (SUTURE) ×1 IMPLANT
SUT VIC AB 2-0 CT1 27 (SUTURE) ×1
SUT VIC AB 2-0 CT1 TAPERPNT 27 (SUTURE) ×1 IMPLANT
SYR BULB 3OZ (MISCELLANEOUS) ×2 IMPLANT
SYR CONTROL 10ML LL (SYRINGE) IMPLANT
TOWEL GREEN STERILE FF (TOWEL DISPOSABLE) ×4 IMPLANT
TUBE CONNECTING 20X1/4 (TUBING) ×2 IMPLANT
UNDERPAD 30X30 (UNDERPADS AND DIAPERS) ×2 IMPLANT
WASHER 3.5MM (Orthopedic Implant) ×2 IMPLANT
YANKAUER SUCT BULB TIP NO VENT (SUCTIONS) ×2 IMPLANT

## 2018-10-07 NOTE — Op Note (Signed)
Date of Surgery: 10/07/2018  INDICATIONS: [name] is a 85 y.o.-year-old female with a left olecranon fracture;  The patient did consent to the procedure after discussion of the risks and benefits.  PREOPERATIVE DIAGNOSIS: Left olecranon fracture  POSTOPERATIVE DIAGNOSIS: Same.  PROCEDURE: Open reduction internal fixation of left olecranon fracture  SURGEON: N. Eduard Roux, M.D.  ASSIST: Ciro Backer [place], [place]; necessary for the timely completion of procedure and due to complexity of procedure.  ANESTHESIA:  general, regional  IV FLUIDS AND URINE: See anesthesia.  ESTIMATED BLOOD LOSS: Minimal mL.  IMPLANTS: Biomet olecranon plate  DRAINS: None  COMPLICATIONS: None.  DESCRIPTION OF PROCEDURE: The patient was brought to the operating room and placed supine then lateral decubitus on the operating table.  The patient had been signed prior to the procedure and this was documented. The patient had the anesthesia placed by the anesthesiologist.  A time-out was performed to confirm that this was the correct patient, site, side and location. The patient did receive antibiotics prior to the incision and was re-dosed during the procedure as needed at indicated intervals.  A tourniquet was placed.  The patient had the operative extremity prepped and draped in the standard surgical fashion.    An incision was made directly over the posterior aspect of the elbow.  Full-thickness flaps were elevated off of the triceps tendon and the proximal ulna.  The fracture was visualized.  An organized hematoma was removed from the fracture site.  There was significant comminution of the fracture.  The bone quality was also poor.  A small rent was made in the insertion of the triceps on the olecranon for the plate.  I first reduced the small butterfly fragment to the ulnar shaft by using a tenaculum clamp.  A 2.7 mm lag screw was then placed across the fracture giving excellent compression.  The tenaculum  clamp was then removed.  The proximal portion of the fracture that was attached to the triceps was then brought back to the rest of the proximal ulna by mobilizing the triceps muscle and placing the elbow in extension.  We then selected the appropriate olecranon plate in the proximal portion was provisionally fixed to the proximal fracture fragment using a K wire.  This was then confirmed under fluoroscopy.  With multiple K wires through the plate into the ulna the plate was provisionally fixed.  I then placed a single homerun screw through the proximal portion of the plate in order to give compression across the fracture first.  This had good purchase.  I then placed a nonlocking screw through the distal portion of the plate in order to contour the plate down to the bone.  3 locking screws were then placed around this nonlocking screw for added fixation.  I then placed a locking screw through the proximal segment of the olecranon through the plate.  A second homerun screw was then placed using fluoroscopic guidance.  Final x-rays were taken.  Elbow was taken through gentle range of motion which showed no gapping of the fracture.  The wound was then thoroughly irrigated with normal saline and closed in layered fashion using 2-0 Vicryl and 3-0 nylon.  Sterile dressings were applied.  Elbow was immobilized at 45 degrees with a long-arm splint.  Patient tolerated the procedure well had no any complications.  POSTOPERATIVE PLAN: Patient will be discharged home.  She will follow-up in 2 weeks.  Azucena Cecil, MD [place] 1:03 PM

## 2018-10-07 NOTE — H&P (Signed)
PREOPERATIVE H&P  Chief Complaint: left olecranon fracture  HPI: [name] is a 85 y.o. female who presents for surgical treatment of left olecranon fracture.  She denies any changes in medical history.  Past Medical History:  Diagnosis Date  . CKD (chronic kidney disease), stage III ([place])   . Closed fracture of left olecranon process   . COPD (chronic obstructive pulmonary disease) ([place])   . Depression   . Diabetes mellitus without complication ([place])   . Headache   . Hypertension   . Orthostatic hypotension   . Osteopenia   . Pacemaker   . 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 Permanent atrial fibrillation    a.  s/p AVN ablation 2010 with complete heart block s/p PPM (gen change 2015, pt preference for Coumadin).  . Protein calorie malnutrition ([place])   . S/P AV nodal ablation   . TIA (transient ischemic attack)    Past Surgical History:  Procedure Laterality Date  . ABLATION  2012   AVN ablation by Dr Lovena Le  . cateracts     eyes  . CYST REMOVAL TRUNK     from breast  . PACEMAKER INSERTION    . PERMANENT PACEMAKER GENERATOR CHANGE N/A 07/18/2013   Procedure: PERMANENT PACEMAKER GENERATOR CHANGE;  Surgeon: Deboraha Sprang, MD;  Location: [facility] CATH LAB;  Service: Cardiovascular;  Laterality: N/A;   Social History   Socioeconomic History  . Marital status: Married    Spouse name: [name]  . Number of children: 0  . Years of education: 67  . 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 Financial resource strain: Not on file  . Food insecurity:    Worry: Not on file    Inability: Not on file  . Transportation needs:    Medical: Not on file    Non-medical: Not on file  Tobacco Use  . Smoking status: Current Every Day Smoker    Packs/day: 1.00  . Smokeless tobacco: Never Used  . Tobacco comment: 03/05/16 1/2 - 1 ppd  Substance and Sexual Activity  .  Alcohol use: No    Alcohol/week: 0.0 standard drinks  . Drug use: No  . Sexual activity: Not on file  Lifestyle  . Physical activity:    Days per week: Not on file    Minutes per session: Not on file  . Stress: Not on file  Relationships  . Social connections:    Talks on phone: Not on file    Gets together: Not on file    Attends religious service: Not on file    Active member of club or organization: Not on file    Attends meetings of clubs or organizations: Not on file    Relationship status: Not on file  Other Topics Concern  . Not on file  Social History Narrative   Lives at home with husband.  Education PHDD.  No children.     Family History  Problem Relation Age of Onset  . Stroke Father    Allergies  Allergen Reactions  . Eliquis [Apixaban] Other (See Comments)    Made pt have weak muscles  . Gabapentin     SHAKES AND NAUSEA   . Rozerem [Ramelteon]     DIZZY AND HEAD THROBBING  . Temazepam     INSOMNIA  . Tikosyn [Dofetilide]     Mouth swells,  itching  . Tradjenta [Linagliptin] Diarrhea    Joint Pain, Stomach issues   Prior to Admission medications   Medication Sig Start Date End Date Taking? Authorizing Provider  clonazePAM (KLONOPIN) 0.5 MG tablet Take 0.5 mg by mouth at bedtime. 01/02/17  Yes [provider]  escitalopram (LEXAPRO) 20 MG tablet Take 20 mg by mouth daily. 02/10/15  Yes [provider]  HYDROcodone-acetaminophen (NORCO) 5-325 MG tablet Take 1 tablet by mouth 3 (three) times daily as needed for moderate pain. 09/27/18  Yes Aundra Dubin, PA-C  Insulin Glargine (TOUJEO SOLOSTAR) 300 UNIT/ML SOPN Inject 4 Units/day into the skin.   Yes [provider]  losartan (COZAAR) 100 MG tablet Take 100 mg by mouth daily.   Yes [provider]  Melatonin 5 MG TABS Take 5 mg by mouth daily.   Yes [provider]  mirtazapine (REMERON) 7.5 MG tablet Take 7.5 mg by mouth at bedtime.    Yes [provider]   Multiple Vitamin (MULTIVITAMIN) tablet Take 1 tablet by mouth daily.   Yes [provider]  pregabalin (LYRICA) 50 MG capsule Take 1 capsule (50 mg total) by mouth daily. 01/19/18  Yes [name], DPM  Rivaroxaban (XARELTO) 15 MG TABS tablet Take 1 tablet (15 mg total) by mouth daily with supper. 09/29/18  Yes Deboraha Sprang, MD  BD PEN NEEDLE NANO U/F 32G X 4 MM MISC AS DIRECTED USE WITH TOUJEO/ DX E11.49 IN VITRO 01/21/18   [provider]  hydrochlorothiazide (HYDRODIURIL) 12.5 MG tablet TAKE 0.5 TABLETS (6.25 MG TOTAL) BY MOUTH DAILY. 12/28/17   [name], MD  magic mouthwash SOLN SWISH AND SWALLOW 1 TEASPOONFUL BY MOUTH EVERY 6 HOURS AS NEEDED 03/08/18   [provider]  PREVIDENT 5000 DRY MOUTH 1.1 % GEL dental gel Take 1 application by mouth See admin instructions. Use to brush teeth every day, replacement for regular toothpaste 08/23/18   [provider]  traMADol (ULTRAM) 50 MG tablet Take 1-2 tabs po  q 6-8 hours 10/04/18   Aundra Dubin, PA-C     Positive ROS: All other systems have been reviewed and were otherwise negative with the exception of those mentioned in the HPI and as above.  Physical Exam: General: Alert, no acute distress Cardiovascular: No pedal edema Respiratory: No cyanosis, no use of accessory musculature GI: abdomen soft Skin: No lesions in the area of chief complaint Neurologic: Sensation intact distally Psychiatric: Patient is competent for consent with normal mood and affect Lymphatic: no lymphedema  MUSCULOSKELETAL: exam stable  Assessment: left olecranon fracture  Plan: Plan for Procedure(s): OPEN REDUCTION INTERNAL FIXATION (ORIF) LEFT OLECRANON FRACTURE  The risks benefits and alternatives were discussed with the patient including but not limited to the risks of nonoperative treatment, versus surgical intervention including infection, bleeding, nerve injury,  blood clots, cardiopulmonary complications,  morbidity, mortality, among others, and they were willing to proceed.   Eduard Roux, MD   10/07/2018 8:53 AM

## 2018-10-07 NOTE — Discharge Instructions (Signed)
°Post Anesthesia Home Care Instructions ° °Activity: °Get plenty of rest for the remainder of the day. A responsible individual must stay with you for 24 hours following the procedure.  °For the next 24 hours, DO NOT: °-Drive a car °-Operate machinery °-Drink alcoholic beverages °-Take any medication unless instructed by your physician °-Make any legal decisions or sign important papers. ° °Meals: °Start with liquid foods such as gelatin or soup. Progress to regular foods as tolerated. Avoid greasy, spicy, heavy foods. If nausea and/or vomiting occur, drink only clear liquids until the nausea and/or vomiting subsides. Call your physician if vomiting continues. ° °Special Instructions/Symptoms: °Your throat may feel dry or sore from the anesthesia or the breathing tube placed in your throat during surgery. If this causes discomfort, gargle with warm salt water. The discomfort should disappear within 24 hours. ° °If you had a scopolamine patch placed behind your ear for the management of post- operative nausea and/or vomiting: ° °1. The medication in the patch is effective for 72 hours, after which it should be removed.  Wrap patch in a tissue and discard in the trash. Wash hands thoroughly with soap and water. °2. You may remove the patch earlier than 72 hours if you experience unpleasant side effects which may include dry mouth, dizziness or visual disturbances. °3. Avoid touching the patch. Wash your hands with soap and water after contact with the patch. °   °Postoperative instructions: ° °Weightbearing instructions: non weight bearing ° °Dressing instructions: Keep your dressing and/or splint clean and dry at all times.  It will be removed at your first post-operative appointment.  Your stitches and/or staples will be removed at this visit. ° °Incision instructions:  Do not soak your incision for 3 weeks after surgery.  If the incision gets wet, pat dry and do not scrub the incision. ° °Pain control:  You have  been given a prescription to be taken as directed for post-operative pain control.  In addition, elevate the operative extremity above the heart at all times to prevent swelling and throbbing pain. ° °Take over-the-counter Colace, 100mg by mouth twice a day while taking narcotic pain medications to help prevent constipation. ° °Follow up appointments: °1) 12-14 days for suture removal and wound check. °2) Dr. Xu as scheduled. ° ° ------------------------------------------------------------------------------------------------------------- ° °After Surgery Pain Control: ° °After your surgery, post-surgical discomfort or pain is likely. This discomfort can last several days to a few weeks. At certain times of the day your discomfort may be more intense.  °Did you receive a nerve block?  °A nerve block can provide pain relief for one hour to two days after your surgery. As long as the nerve block is working, you will experience little or no sensation in the area the surgeon operated on.  °As the nerve block wears off, you will begin to experience pain or discomfort. It is very important that you begin taking your prescribed pain medication before the nerve block fully wears off. Treating your pain at the first sign of the block wearing off will ensure your pain is better controlled and more tolerable when full-sensation returns. Do not wait until the pain is intolerable, as the medicine will be less effective. It is better to treat pain in advance than to try and catch up.  °General Anesthesia:  °If you did not receive a nerve block during your surgery, you will need to start taking your pain medication shortly after your surgery and should continue to do so   as prescribed by your surgeon.  °Pain Medication:  °Most commonly we prescribe Vicodin and Percocet for post-operative pain. Both of these medications contain a combination of acetaminophen (Tylenol®) and a narcotic to help control pain.  °· It takes between 30 and  45 minutes before pain medication starts to work. It is important to take your medication before your pain level gets too intense.  °· Nausea is a common side effect of many pain medications. You will want to eat something before taking your pain medicine to help prevent nausea.  °· If you are taking a prescription pain medication that contains acetaminophen, we recommend that you do not take additional over the counter acetaminophen (Tylenol®).  °Other pain relieving options:  °· Using a cold pack to ice the affected area a few times a day (15 to 20 minutes at a time) can help to relieve pain, reduce swelling and bruising.  °· Elevation of the affected area can also help to reduce pain and swelling. ° ° ° °

## 2018-10-07 NOTE — Anesthesia Postprocedure Evaluation (Signed)
Anesthesia Post Note  Patient: [name]  Procedure(s) Performed: OPEN REDUCTION INTERNAL FIXATION (ORIF) LEFT OLECRANON FRACTURE (Left Elbow)     Patient location during evaluation: PACU Anesthesia Type: General Level of consciousness: awake and alert and oriented Pain management: pain level controlled Vital Signs Assessment: post-procedure vital signs reviewed and stable Respiratory status: spontaneous breathing, nonlabored ventilation and respiratory function stable Cardiovascular status: blood pressure returned to baseline and stable Postop Assessment: no apparent nausea or vomiting Anesthetic complications: no    Last Vitals:  Vitals:   10/07/18 1430 10/07/18 1445  BP: (!) 162/66 (!) 157/63  Pulse: 80   Resp: (!) 22 19  Temp:    SpO2: 95%     Last Pain:  Vitals:   10/07/18 1430  TempSrc:   PainSc: 9                  [name] A.

## 2018-10-07 NOTE — Anesthesia Procedure Notes (Signed)
Procedure Name: LMA Insertion Performed by: Terrance Mass, CRNA Pre-anesthesia Checklist: Patient identified, Emergency Drugs available, Suction available and Patient being monitored Patient Re-evaluated:Patient Re-evaluated prior to induction Oxygen Delivery Method: Circle system utilized Preoxygenation: Pre-oxygenation with 100% oxygen Induction Type: IV induction Ventilation: Mask ventilation without difficulty LMA: LMA inserted LMA Size: 3.0 Number of attempts: 1 Placement Confirmation: positive ETCO2 Tube secured with: Tape Dental Injury: Teeth and Oropharynx as per pre-operative assessment

## 2018-10-07 NOTE — Transfer of Care (Signed)
Immediate Anesthesia Transfer of Care Note  Patient: [name]  Procedure(s) Performed: OPEN REDUCTION INTERNAL FIXATION (ORIF) LEFT OLECRANON FRACTURE (Left Elbow)  Patient Location: PACU  Anesthesia Type:General  Level of Consciousness: awake and sedated  Airway & Oxygen Therapy: Patient Spontanous Breathing and Patient connected to nasal cannula oxygen  Post-op Assessment: Report given to RN and Post -op Vital signs reviewed and stable  Post vital signs: Reviewed and stable  Last Vitals:  Vitals Value Taken Time  BP 141/63 10/07/2018  1:34 PM  Temp    Pulse 80 10/07/2018  1:38 PM  Resp 15 10/07/2018  1:38 PM  SpO2 100 % 10/07/2018  1:38 PM  Vitals shown include unvalidated device data.  Last Pain:  Vitals:   10/07/18 0915  TempSrc: Oral  PainSc: 0-No pain         Complications: No apparent anesthesia complications

## 2018-10-07 NOTE — Anesthesia Preprocedure Evaluation (Signed)
Anesthesia Evaluation  Patient identified by MRN, date of birth, ID band Patient awake    Reviewed: Allergy & Precautions, NPO status , Patient's Chart, lab work & pertinent test results  Airway Mallampati: II  TM Distance: >3 FB Neck ROM: Full    Dental no notable dental hx.    Pulmonary COPD,  COPD inhaler, Current Smoker,    Pulmonary exam normal breath sounds clear to auscultation + decreased breath sounds      Cardiovascular hypertension, Pt. on medications Normal cardiovascular exam+ dysrhythmias Atrial Fibrillation + pacemaker  Rhythm:Regular Rate:Normal     Neuro/Psych  Headaches, PSYCHIATRIC DISORDERS Depression TIA   GI/Hepatic negative GI ROS, Neg liver ROS,   Endo/Other  diabetes, Well Controlled, Type 2, Insulin Dependent  Renal/GU Renal disease  negative genitourinary   Musculoskeletal Fx left olecranon   Abdominal   Peds  Hematology Xarelto therapy- last dose 5/17   Anesthesia Other Findings   Reproductive/Obstetrics                             Anesthesia Physical Anesthesia Plan  ASA: III  Anesthesia Plan: General   Post-op Pain Management:    Induction:   PONV Risk Score and Plan: 3 and Ondansetron and Treatment may vary due to age or medical condition  Airway Management Planned: Oral ETT and LMA  Additional Equipment:   Intra-op Plan:   Post-operative Plan: Extubation in OR  Informed Consent: I have reviewed the patients History and Physical, chart, labs and discussed the procedure including the risks, benefits and alternatives for the proposed anesthesia with the patient or authorized representative who has indicated his/her understanding and acceptance.     Dental advisory given  Plan Discussed with: CRNA and Surgeon  Anesthesia Plan Comments:         Anesthesia Quick Evaluation

## 2018-10-08 ENCOUNTER — Encounter (HOSPITAL_BASED_OUTPATIENT_CLINIC_OR_DEPARTMENT_OTHER): Payer: Self-pay | Admitting: Orthopaedic Surgery

## 2018-10-08 ENCOUNTER — Telehealth: Payer: Self-pay | Admitting: Orthopaedic Surgery

## 2018-10-08 NOTE — Telephone Encounter (Signed)
Called patient advised message below.

## 2018-10-08 NOTE — Telephone Encounter (Signed)
She can resume now.

## 2018-10-08 NOTE — Telephone Encounter (Signed)
Please advise 

## 2018-10-08 NOTE — Telephone Encounter (Signed)
Pt called in said she had surgery with dr.xu for her elbow and she stopped taking her blood thinner Monday 10-04-18 and is wondering when should she resume taking them? [phone]

## 2018-10-14 ENCOUNTER — Other Ambulatory Visit: Payer: Self-pay

## 2018-10-14 ENCOUNTER — Ambulatory Visit (INDEPENDENT_AMBULATORY_CARE_PROVIDER_SITE_OTHER): Payer: Medicare Other

## 2018-10-14 ENCOUNTER — Ambulatory Visit (INDEPENDENT_AMBULATORY_CARE_PROVIDER_SITE_OTHER): Payer: Medicare Other | Admitting: Physician Assistant

## 2018-10-14 ENCOUNTER — Encounter: Payer: Self-pay | Admitting: Orthopaedic Surgery

## 2018-10-14 DIAGNOSIS — S52022A Displaced fracture of olecranon process without intraarticular extension of left ulna, initial encounter for closed fracture: Secondary | ICD-10-CM | POA: Diagnosis not present

## 2018-10-14 MED ORDER — PROMETHAZINE HCL 12.5 MG PO TABS: 12.5000 mg | ORAL_TABLET | Freq: Three times a day (TID) | ORAL | 0 refills | Status: DC | PRN

## 2018-10-14 MED ORDER — TRAMADOL HCL 50 MG PO TABS: ORAL_TABLET | ORAL | 2 refills | Status: DC

## 2018-10-14 MED ORDER — ONDANSETRON 4 MG PO TBDP: 4.0000 mg | ORAL_TABLET | Freq: Three times a day (TID) | ORAL | 0 refills | Status: DC | PRN

## 2018-10-14 MED ORDER — HYDROCODONE-ACETAMINOPHEN 5-325 MG PO TABS: ORAL_TABLET | ORAL | 0 refills | Status: DC

## 2018-10-14 MED ORDER — MUPIROCIN 2 % EX OINT: TOPICAL_OINTMENT | CUTANEOUS | 0 refills | Status: DC

## 2018-10-14 NOTE — Progress Notes (Signed)
Post-Op Visit Note   Patient: [name]           Date of Birth: 08/14/1928           MRN: [id number] Visit Date: 10/14/2018 PCP: Antony Contras, MD   Assessment & Plan:  Chief Complaint:  Chief Complaint  Patient presents with  . Left Elbow - Pain, Routine Post Op, Follow-up   Visit Diagnoses:  1. Closed fracture of left olecranon process, initial encounter     Plan: Patient is a pleasant 85 year old female who presents our clinic today 7 days status post ORIF left olecranon fracture, date of surgery 10/07/2018.  She has been compliant in her long-arm splint.  She has had moderate pain as she has been unable to take the narcotic pain medication due to nausea and the inability to obtain her nausea medicine secondary to issues with her insurance company.  She has been taking tramadol which helps but has run out.  No fevers or chills.  Examination of her left upper extremity reveals a well-healing surgical incision with nylon sutures in place.  She has slight bloody drainage to the distal aspect of the incision.  She has mild to moderate erythema and moderate swelling to the forearm.  No evidence of infection.  She is neurovascularly intact distally.  Today, we applied mupirocin ointment to the incision followed by soft dressing.  She can wear her sling for comfort.  We have called in mupirocin which she will apply twice daily.  I have also called in to new antinausea medications as well as pain medications.  She will follow-up with [place] in 1 week's time for recheck.  Call with concerns or questions in the meantime.  Follow-Up Instructions: Return in about 1 week (around 10/21/2018).   Orders:  Orders Placed This Encounter  Procedures  . XR Elbow Complete Left (3+View)   Meds ordered this encounter  Medications  . traMADol (ULTRAM) 50 MG tablet    Sig: Take 1-2 tabs po q6-8 hours prn pain (only take if not taking hydrocodone)    Dispense:  30 tablet    Refill:  2  .  HYDROcodone-acetaminophen (NORCO) 5-325 MG tablet    Sig: Take 1-2 tabs q 6-8 hours prn pain (only take when not taking tramadol)    Dispense:  20 tablet    Refill:  0  . ondansetron (ZOFRAN-ODT) 4 MG disintegrating tablet    Sig: Take 1 tablet (4 mg total) by mouth every 8 (eight) hours as needed for nausea or vomiting.    Dispense:  20 tablet    Refill:  0  . mupirocin ointment (BACTROBAN) 2 %    Sig: Apply to incision twice daily    Dispense:  22 g    Refill:  0  . promethazine (PHENERGAN) 12.5 MG tablet    Sig: Take 1 tablet (12.5 mg total) by mouth 3 (three) times daily as needed for nausea or vomiting.    Dispense:  30 tablet    Refill:  0    Imaging: Xr Elbow Complete Left (3+view)  Result Date: 10/14/2018 Stable alignment of the hardware without complication or acute findings   PMFS History: Patient Active Problem List   Diagnosis Date Noted  . Closed fracture of left olecranon process, initial encounter 09/20/2018  . Chronic anticoagulation 02/10/2014  . Pure hypercholesterolemia 02/10/2014  . Encounter for therapeutic drug monitoring 06/15/2013  . Atrioventricular block, complete ([place]) 06/07/2013  . Pacemaker-Medtronic 06/07/2013  . Atrial fibrillation (  [place]) 02/24/2013   Past Medical History:  Diagnosis Date  . CKD (chronic kidney disease), stage III ([place])   . Closed fracture of left olecranon process   . COPD (chronic obstructive pulmonary disease) ([place])   . Depression   . Diabetes mellitus without complication ([place])   . Headache   . Hypertension   . Orthostatic hypotension   . Osteopenia   . Pacemaker   . 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 Permanent atrial fibrillation    a.  s/p AVN ablation 2010 with complete heart block s/p PPM (gen change 2015, pt preference for Coumadin).  . Protein calorie malnutrition ([place])   . S/P AV nodal ablation   . TIA (transient ischemic attack)     Family History  Problem  Relation Age of Onset  . Stroke Father     Past Surgical History:  Procedure Laterality Date  . ABLATION  2012   AVN ablation by Dr Lovena Le  . cateracts     eyes  . CYST REMOVAL TRUNK     from breast  . ORIF ELBOW FRACTURE Left 10/07/2018   Procedure: OPEN REDUCTION INTERNAL FIXATION (ORIF) LEFT OLECRANON FRACTURE;  Surgeon: Leandrew Koyanagi, MD;  Location: [place];  Service: Orthopedics;  Laterality: Left;  . PACEMAKER INSERTION    . 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 Smoking status: Current Every Day Smoker    Packs/day: 1.00  . Smokeless tobacco: Never Used  . Tobacco comment: 03/05/16 1/2 - 1 ppd  Substance and Sexual Activity  . Alcohol use: No    Alcohol/week: 0.0 standard drinks  . Drug use: No  . Sexual activity: Not on file

## 2018-10-15 ENCOUNTER — Encounter (INDEPENDENT_AMBULATORY_CARE_PROVIDER_SITE_OTHER): Payer: Self-pay | Admitting: Specialist

## 2018-10-15 NOTE — Progress Notes (Signed)
SNF called requesting clarification of dressing changes for this patient's elbow incision. I reviewed her office note from yesterday and she is to be on twice daily soft dry dressings to the elbow with mupirocin cream to the lower incision. Sling prn as indicated in the note. jen

## 2018-10-17 ENCOUNTER — Other Ambulatory Visit: Payer: Medicare Other

## 2018-10-17 ENCOUNTER — Telehealth: Payer: Self-pay | Admitting: Internal Medicine

## 2018-10-17 DIAGNOSIS — Z20822 Contact with and (suspected) exposure to covid-19: Secondary | ICD-10-CM

## 2018-10-17 NOTE — Telephone Encounter (Signed)
Spoke with the patient about possible Covid-19 exposure at 10/14/18 office visit to [facility]. RN explained the free Covid-19 screening offered for this exposure. Notified that she and her driver should wear a masks and the drive-up screening will take place at the old [facility] on [address]. Patient chose to come 10/17/18 at 1500.

## 2018-10-18 ENCOUNTER — Telehealth: Payer: Self-pay | Admitting: Podiatry

## 2018-10-18 LAB — NOVEL CORONAVIRUS, NAA: SARS-CoV-2, NAA: NOT DETECTED

## 2018-10-18 MED ORDER — PREGABALIN 50 MG PO CAPS: 50.0000 mg | ORAL_CAPSULE | Freq: Every day | ORAL | 5 refills | Status: DC

## 2018-10-18 NOTE — Telephone Encounter (Signed)
Left message informing pharmacy of the lyrica order.

## 2018-10-18 NOTE — Telephone Encounter (Signed)
I spoke with pt and told her I would send the refills of the lyrica to her pharmacy

## 2018-10-18 NOTE — Addendum Note (Signed)
Addended by: Harriett Sine D on: 10/18/2018 01:12 PM   Modules accepted: Orders

## 2018-10-18 NOTE — Telephone Encounter (Signed)
Called pt to reschedule appt scheduled for tomorrow and pt stated she needed to cancel for now as she broke her arm in 3 places. Pt requested a refill of Lyrica be sent to [id number].

## 2018-10-19 ENCOUNTER — Ambulatory Visit: Payer: Medicare Other | Admitting: Podiatry

## 2018-10-19 DIAGNOSIS — E119 Type 2 diabetes mellitus without complications: Secondary | ICD-10-CM | POA: Diagnosis not present

## 2018-10-19 DIAGNOSIS — Z794 Long term (current) use of insulin: Secondary | ICD-10-CM | POA: Diagnosis not present

## 2018-10-20 ENCOUNTER — Telehealth: Payer: Self-pay | Admitting: Orthopaedic Surgery

## 2018-10-20 ENCOUNTER — Telehealth: Payer: Self-pay

## 2018-10-20 NOTE — Telephone Encounter (Signed)
[place] Physical Therapist with [facility] called in to inform Dr.Xu that she has attempted to reach out to the family and start ph like ordered but pt husband says she isn't ready to start ph at this time.  Pt has an appt tomorrow 10-21-2018 [phone]

## 2018-10-20 NOTE — Telephone Encounter (Signed)
See message below. FYI

## 2018-10-20 NOTE — Telephone Encounter (Signed)
COVID 19 results given to husband.

## 2018-10-20 NOTE — Telephone Encounter (Signed)
We will discuss at appt tomorrow then

## 2018-10-21 ENCOUNTER — Encounter: Payer: Self-pay | Admitting: Orthopaedic Surgery

## 2018-10-21 ENCOUNTER — Ambulatory Visit (INDEPENDENT_AMBULATORY_CARE_PROVIDER_SITE_OTHER): Payer: Medicare Other

## 2018-10-21 ENCOUNTER — Ambulatory Visit (INDEPENDENT_AMBULATORY_CARE_PROVIDER_SITE_OTHER): Payer: Medicare Other | Admitting: Physician Assistant

## 2018-10-21 ENCOUNTER — Other Ambulatory Visit: Payer: Self-pay

## 2018-10-21 DIAGNOSIS — S52022A Displaced fracture of olecranon process without intraarticular extension of left ulna, initial encounter for closed fracture: Secondary | ICD-10-CM

## 2018-10-21 DIAGNOSIS — M25522 Pain in left elbow: Secondary | ICD-10-CM

## 2018-10-21 MED ORDER — CEPHALEXIN 500 MG PO CAPS: 500.0000 mg | ORAL_CAPSULE | Freq: Four times a day (QID) | ORAL | 0 refills | Status: DC

## 2018-10-21 NOTE — Addendum Note (Signed)
Addended by: Precious Bard on: 10/21/2018 11:25 AM   Modules accepted: Orders

## 2018-10-21 NOTE — Progress Notes (Signed)
Post-Op Visit Note   Patient: [name]           Date of Birth: 1928-07-24           MRN: [id number] Visit Date: 10/21/2018 PCP: Antony Contras, MD   Assessment & Plan:  Chief Complaint:  Chief Complaint  Patient presents with  . Left Elbow - Routine Post Op, Follow-up   Visit Diagnoses:  1. Pain in left elbow   2. Closed fracture of left olecranon process, initial encounter     Plan: Patient is a pleasant 85 year old female who presents our clinic today 14 days status post ORIF left open arm fracture, date of surgery 10/07/2018.  She has been doing okay.  She has minimal pain but does note that it is moderate at times.  This is relieved with narcotic pain medication.  She has been compliant nonweightbearing and has been wearing a sling over the past week.  She has been applying mupirocin twice daily to her incision.  She denies any fevers or chills.  Examination of her left elbow reveals wound dehiscence.  She has peri-incisional erythema.  Slight warmth.  Slight tenderness.  Marked ecchymosis and swelling throughout.  She is able to wiggle her fingers.  Her fingers are warm and well-perfused.  At this point, we will remove her nylon sutures.  We will start wet-to-dry dressing changes twice daily.  The patient was instructed on this.  We will start her on Keflex 500 mg 4 times daily for the next 7 days.  We will obtain a hemoglobin AIC.  She will follow-up with [place] in 7 days for wound check.  She will call us with concerning symptoms in the meantime.  Follow-Up Instructions: Return in about 1 week (around 10/28/2018).   Orders:  Orders Placed This Encounter  Procedures  . XR Elbow Complete Left (3+View)   Meds ordered this encounter  Medications  . cephALEXin (KEFLEX) 500 MG capsule    Sig: Take 1 capsule (500 mg total) by mouth 4 (four) times daily.    Dispense:  28 capsule    Refill:  0    Imaging: Xr Elbow Complete Left (3+view)  Result Date: 10/21/2018 Stable fixation  of left olecranon fracture   PMFS History: Patient Active Problem List   Diagnosis Date Noted  . Closed fracture of left olecranon process, initial encounter 09/20/2018  . Chronic anticoagulation 02/10/2014  . Pure hypercholesterolemia 02/10/2014  . Encounter for therapeutic drug monitoring 06/15/2013  . Atrioventricular block, complete ([place]) 06/07/2013  . Pacemaker-Medtronic 06/07/2013  . Atrial fibrillation ([place]) 02/24/2013   Past Medical History:  Diagnosis Date  . CKD (chronic kidney disease), stage III ([place])   . Closed fracture of left olecranon process   . COPD (chronic obstructive pulmonary disease) ([place])   . Depression   . Diabetes mellitus without complication ([place])   . Headache   . Hypertension   . Orthostatic hypotension   . Osteopenia   . Pacemaker   . 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 Permanent atrial fibrillation    a.  s/p AVN ablation 2010 with complete heart block s/p PPM (gen change 2015, pt preference for Coumadin).  . Protein calorie malnutrition ([place])   . S/P AV nodal ablation   . TIA (transient ischemic attack)     Family History  Problem Relation Age of Onset  . Stroke Father     Past Surgical History:  Procedure Laterality Date  .  ABLATION  2012   AVN ablation by Dr Lovena Le  . cateracts     eyes  . CYST REMOVAL TRUNK     from breast  . ORIF ELBOW FRACTURE Left 10/07/2018   Procedure: OPEN REDUCTION INTERNAL FIXATION (ORIF) LEFT OLECRANON FRACTURE;  Surgeon: Leandrew Koyanagi, MD;  Location: [place];  Service: Orthopedics;  Laterality: Left;  . PACEMAKER INSERTION    . 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 Smoking status: Current  Every Day Smoker    Packs/day: 1.00  . Smokeless tobacco: Never Used  . Tobacco comment: 03/05/16 1/2 - 1 ppd  Substance and Sexual Activity  . Alcohol use: No    Alcohol/week: 0.0 standard drinks  . Drug use: No  . Sexual activity: Not on file

## 2018-10-22 ENCOUNTER — Other Ambulatory Visit: Payer: Self-pay | Admitting: Physician Assistant

## 2018-10-22 LAB — HEMOGLOBIN A1C
Hgb A1c MFr Bld: 7.2 % of total Hgb — ABNORMAL HIGH (ref <5.7)
Mean Plasma Glucose: 160 (calc)
eAG (mmol/L): 8.9 (calc)

## 2018-10-22 LAB — EXTRA SPECIMEN

## 2018-10-22 MED ORDER — CEPHALEXIN 500 MG PO CAPS: 500.0000 mg | ORAL_CAPSULE | Freq: Four times a day (QID) | ORAL | 0 refills | Status: DC

## 2018-10-25 ENCOUNTER — Telehealth: Payer: Self-pay

## 2018-10-25 NOTE — Telephone Encounter (Signed)
Saundra/Sonja called and left voicemail states she was at patients house this AM and needs clarification on dressing changes. Nurse will only come out twice a week.   Per PA/MD  "We will start wet-to-dry dressing changes twice daily."   Called her back no answer LMOM to return call to advise.   [phone]

## 2018-10-26 NOTE — Telephone Encounter (Signed)
Called her back to advise on message below. She is seeing patient twice a week. Next visit is Friday.

## 2018-10-27 ENCOUNTER — Other Ambulatory Visit: Payer: Self-pay | Admitting: Cardiology

## 2018-10-28 ENCOUNTER — Encounter: Payer: Self-pay | Admitting: Orthopaedic Surgery

## 2018-10-28 ENCOUNTER — Ambulatory Visit (INDEPENDENT_AMBULATORY_CARE_PROVIDER_SITE_OTHER): Payer: Medicare Other | Admitting: Orthopaedic Surgery

## 2018-10-28 ENCOUNTER — Other Ambulatory Visit: Payer: Self-pay

## 2018-10-28 DIAGNOSIS — S52022A Displaced fracture of olecranon process without intraarticular extension of left ulna, initial encounter for closed fracture: Secondary | ICD-10-CM

## 2018-10-28 MED ORDER — CEPHALEXIN 500 MG PO CAPS: 500.0000 mg | ORAL_CAPSULE | Freq: Four times a day (QID) | ORAL | 0 refills | Status: DC

## 2018-10-28 NOTE — Progress Notes (Signed)
Patient ID: [name], female   DOB: 1929/05/01, 85 y.o.   MRN: [id number]  [name] comes back for wound check today.  She is 3 weeks status post ORIF left olecranon fracture.  She has been taking her Keflex.  She has been applying wet-to-dry dressings twice a day.  Overall the elbow appears to be better.  The wound is healing well.  There are some areas that are still healing.  No evidence of active infection.  We will continue with the Keflex and the wet-to-dry dressings.  I like to recheck her next week with two-view x-rays of the left elbow.

## 2018-10-29 ENCOUNTER — Other Ambulatory Visit: Payer: Self-pay | Admitting: Physician Assistant

## 2018-10-29 ENCOUNTER — Telehealth: Payer: Self-pay | Admitting: Orthopaedic Surgery

## 2018-10-29 ENCOUNTER — Telehealth: Payer: Self-pay

## 2018-10-29 DIAGNOSIS — I129 Hypertensive chronic kidney disease with stage 1 through stage 4 chronic kidney disease, or unspecified chronic kidney disease: Secondary | ICD-10-CM | POA: Diagnosis not present

## 2018-10-29 DIAGNOSIS — F339 Major depressive disorder, recurrent, unspecified: Secondary | ICD-10-CM | POA: Diagnosis not present

## 2018-10-29 DIAGNOSIS — F411 Generalized anxiety disorder: Secondary | ICD-10-CM | POA: Diagnosis not present

## 2018-10-29 DIAGNOSIS — E1149 Type 2 diabetes mellitus with other diabetic neurological complication: Secondary | ICD-10-CM | POA: Diagnosis not present

## 2018-10-29 NOTE — Telephone Encounter (Signed)
[name] with [facility] wanted to let you know that patient has an internal suture in her left elbow where it has been draining.  CB# is [phone].  Please advise.  Thank you.

## 2018-10-29 NOTE — Telephone Encounter (Signed)
Pt called in said she came in for an appt yesterday and asked for her prescriptions for hydrocodone and promethazine be refilled but she said they have not been sent to the pharmacy. [phone] [facility] [address]

## 2018-10-29 NOTE — Telephone Encounter (Signed)
Ok we'll take it out next week when she comes in for f/u appt

## 2018-11-01 NOTE — Telephone Encounter (Signed)
Please advise on medication

## 2018-11-01 NOTE — Telephone Encounter (Signed)
Noted. Thank You.

## 2018-11-02 ENCOUNTER — Encounter: Payer: Self-pay | Admitting: Orthopaedic Surgery

## 2018-11-02 ENCOUNTER — Ambulatory Visit (INDEPENDENT_AMBULATORY_CARE_PROVIDER_SITE_OTHER): Payer: Medicare Other | Admitting: Orthopaedic Surgery

## 2018-11-02 ENCOUNTER — Other Ambulatory Visit: Payer: Self-pay

## 2018-11-02 ENCOUNTER — Other Ambulatory Visit: Payer: Self-pay | Admitting: Physician Assistant

## 2018-11-02 ENCOUNTER — Other Ambulatory Visit (HOSPITAL_COMMUNITY)
Admission: RE | Admit: 2018-11-02 | Discharge: 2018-11-02 | Disposition: A | Discharge status: Home / Self Care | Payer: Medicare Other | Source: Ambulatory Visit | Attending: Orthopaedic Surgery | Admitting: Orthopaedic Surgery

## 2018-11-02 DIAGNOSIS — Z1159 Encounter for screening for other viral diseases: Secondary | ICD-10-CM | POA: Insufficient documentation

## 2018-11-02 DIAGNOSIS — S52022S Displaced fracture of olecranon process without intraarticular extension of left ulna, sequela: Secondary | ICD-10-CM

## 2018-11-02 DIAGNOSIS — Z7901 Long term (current) use of anticoagulants: Secondary | ICD-10-CM | POA: Diagnosis not present

## 2018-11-02 DIAGNOSIS — E1122 Type 2 diabetes mellitus with diabetic chronic kidney disease: Secondary | ICD-10-CM | POA: Diagnosis not present

## 2018-11-02 DIAGNOSIS — Z8673 Personal history of transient ischemic attack (TIA), and cerebral infarction without residual deficits: Secondary | ICD-10-CM | POA: Diagnosis not present

## 2018-11-02 DIAGNOSIS — I442 Atrioventricular block, complete: Secondary | ICD-10-CM | POA: Diagnosis not present

## 2018-11-02 DIAGNOSIS — S51012D Laceration without foreign body of left elbow, subsequent encounter: Secondary | ICD-10-CM | POA: Diagnosis not present

## 2018-11-02 DIAGNOSIS — Z950 Presence of cardiac pacemaker: Secondary | ICD-10-CM | POA: Diagnosis not present

## 2018-11-02 DIAGNOSIS — E46 Unspecified protein-calorie malnutrition: Secondary | ICD-10-CM | POA: Diagnosis not present

## 2018-11-02 DIAGNOSIS — S52022D Displaced fracture of olecranon process without intraarticular extension of left ulna, subsequent encounter for closed fracture with routine healing: Secondary | ICD-10-CM | POA: Diagnosis not present

## 2018-11-02 DIAGNOSIS — F17210 Nicotine dependence, cigarettes, uncomplicated: Secondary | ICD-10-CM | POA: Diagnosis not present

## 2018-11-02 DIAGNOSIS — J440 Chronic obstructive pulmonary disease with acute lower respiratory infection: Secondary | ICD-10-CM | POA: Diagnosis not present

## 2018-11-02 DIAGNOSIS — N183 Chronic kidney disease, stage 3 (moderate): Secondary | ICD-10-CM | POA: Diagnosis not present

## 2018-11-02 DIAGNOSIS — M8580 Other specified disorders of bone density and structure, unspecified site: Secondary | ICD-10-CM | POA: Diagnosis not present

## 2018-11-02 DIAGNOSIS — Z9181 History of falling: Secondary | ICD-10-CM | POA: Diagnosis not present

## 2018-11-02 DIAGNOSIS — I129 Hypertensive chronic kidney disease with stage 1 through stage 4 chronic kidney disease, or unspecified chronic kidney disease: Secondary | ICD-10-CM | POA: Diagnosis not present

## 2018-11-02 DIAGNOSIS — I4819 Other persistent atrial fibrillation: Secondary | ICD-10-CM | POA: Diagnosis not present

## 2018-11-02 DIAGNOSIS — Z794 Long term (current) use of insulin: Secondary | ICD-10-CM | POA: Diagnosis not present

## 2018-11-02 MED ORDER — PROMETHAZINE HCL 12.5 MG PO TABS: 12.5000 mg | ORAL_TABLET | Freq: Three times a day (TID) | ORAL | 0 refills | Status: DC | PRN

## 2018-11-02 MED ORDER — HYDROCODONE-ACETAMINOPHEN 5-325 MG PO TABS: ORAL_TABLET | ORAL | 0 refills | Status: DC

## 2018-11-02 NOTE — Telephone Encounter (Signed)
I called patient and advised. 

## 2018-11-02 NOTE — Telephone Encounter (Signed)
I just sent in both

## 2018-11-02 NOTE — Progress Notes (Signed)
Patient ID: [name], female   DOB: [birth year], 85 y.o.   MRN: [id number]  [name] comes in today for left elbow surgical wound dehiscence that was noticed by her home health physical therapy.  She was sent here for evaluation.  She is currently taking oral antibiotics.  Her last dose of Xarelto was last night.  Her left elbow shows a surgical wound dehiscence mainly at the tip of the olecranon process with exposed hardware.  There is no drainage.  Given the clinical findings we will need to take her back to the operating room for I&D and attempted closure versus wound VAC placement.  We will do this on Thursday.  Questions encouraged and answered.

## 2018-11-03 ENCOUNTER — Other Ambulatory Visit: Payer: Self-pay

## 2018-11-03 ENCOUNTER — Encounter (HOSPITAL_COMMUNITY): Payer: Self-pay | Admitting: *Deleted

## 2018-11-03 LAB — NOVEL CORONAVIRUS, NAA (HOSP ORDER, SEND-OUT TO REF LAB; TAT 18-24 HRS): SARS-CoV-2, NAA: NOT DETECTED

## 2018-11-03 NOTE — Progress Notes (Addendum)
Anesthesia Chart Review: SAME DAY WORK-UP   Case: [id number] Date/Time: 11/04/18 1335   Procedure: IRRIGATION AND DEBRIDEMENT LEFT ELBOW, WOUND VAC (Left )   Anesthesia type: Choice   Pre-op diagnosis: left elbow wound dehiscence   Location: [facility] OR ROOM 07 / [place] [facility]   Surgeon: Leandrew Koyanagi, MD      DISCUSSION: Patient is an 85 year old female scheduled for the above procedure. She lost her balance and fell on 09/16/18 and sustained a left olecranon fracture. She is s/p ORIF of left olecranon fracture on 10/07/18. Wound dehiscence noted at her 10/21/18 follow-up visit, and she was placed on Keflex with wet-to-dry dressing changes. She was re-evaluated on 11/02/18 and noted to have exposed hardware. Dr. Erlinda Hong comments that her last Xarelto was on 11/01/18.  Other history includes smoking, COPD, HTN (with history of orthostatic hypotension), TIA (09/2006), permanent afib (s/p multiple DCCV, s/p AV nodal ablation 06/02/08), SSS (s/p dual chamber Medtronic PPM, initially placed 09/02/06; generator change 07/18/13; left chest), DM2, CKD stage III. 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  Prior to her ORIF, Dr. Marlou Porch had agreed with holding Xarelto for 3 days prior to surgery, understanding small cumulative risk from a stroke perspective.  11/02/18 presurgical COVID test is in process. She is for updated labs and anesthesia team evaluation on the day of surgery.   VS: Ht 5\' 3"  (1.6 m)   Wt 45.8 kg   LMP  (LMP Unknown)   BMI 17.89 kg/m   PROVIDERS: Antony Contras, MD is PCP  Candee Furbish, MD is primary cardiologist. Last visit 07/20/18 with Truitt Merle, NP. Patient still going to the gym twice a week at that time with no chest pain or breathing issues. She was not interested in smoking cessation.  Six month follow-up planned.  Virl Axe, MD is EP cardiologist. Last visit 07/22/18. No changes made in PPM programming. She was switched from warfarin to Xarelto starting 08/30/18.   LABS: She will  need updated labs on arrival. CBC WNL and Cr 1.36 on 10/04/18. A1c 7.2 on 10/21/18.   EKG: 07/22/18: Electronic ventricular pacemaker.   CV: Echo 01/26/17: Study Conclusions - Left ventricle: The cavity size was normal. Systolic function was   normal. The estimated ejection fraction was in the range of 60%   to 65%. Wall motion was normal; there were no regional wall   motion abnormalities. The study is not technically sufficient to   allow evaluation of LV diastolic function. - Aortic valve: Transvalvular velocity was within the normal range.   There was no stenosis. There was no regurgitation. - Mitral valve: Calcified annulus. Transvalvular velocity was   within the normal range. There was no evidence for stenosis.   There was mild regurgitation. - Left atrium: The atrium was mildly dilated. - Right ventricle: The cavity size was normal. Wall thickness was   normal. Systolic function was normal. - Right atrium: The atrium was severely dilated. - Atrial septum: No defect or patent foramen ovale was identified. - Tricuspid valve: There was mild regurgitation. - Pulmonary arteries: Systolic pressure was within the normal   range. PA peak pressure: 25 mm Hg (S).  Carotid US 02/22/16: Impression: Heterogeneous plaque, bilaterally.  1-39% bilateral ICA stenosis.  Normal subclavian arteries, bilaterally.  Patent vertebral arteries with antegrade flow.   Last stress test > 5 years ago.   Past Medical History:  Diagnosis Date  . Ambulates with cane    straight  . Anxiety   .  Arthritis    lower back  . CKD (chronic kidney disease), stage III ([place])   . Closed fracture of left olecranon process   . COPD (chronic obstructive pulmonary disease) ([place])   . Depression   . Diabetes mellitus without complication ([place])    type 2  . Headache   . Hypertension   . Orthostatic hypotension   . Osteopenia   . Pacemaker   . Pacemaker-dependent due to native cardiac rhythm insufficient to  support life   . Permanent atrial fibrillation    a.  s/p AVN ablation 2010 with complete heart block s/p PPM (gen change 2015, pt preference for Coumadin).  . Protein calorie malnutrition ([place])   . S/P AV nodal ablation   . TIA (transient ischemic attack)     Past Surgical History:  Procedure Laterality Date  . ABLATION  2012   AVN ablation by Dr Lovena Le  . cateracts Bilateral    eyes  . CYST REMOVAL TRUNK     from breast  . ORIF ELBOW FRACTURE Left 10/07/2018   Procedure: OPEN REDUCTION INTERNAL FIXATION (ORIF) LEFT OLECRANON FRACTURE;  Surgeon: Leandrew Koyanagi, MD;  Location: [place];  Service: Orthopedics;  Laterality: Left;  . PACEMAKER INSERTION    . PERMANENT PACEMAKER GENERATOR CHANGE N/A 07/18/2013   Procedure: PERMANENT PACEMAKER GENERATOR CHANGE;  Surgeon: Deboraha Sprang, MD;  Location: [facility] CATH LAB;  Service: Cardiovascular;  Laterality: N/A;  . WISDOM TOOTH EXTRACTION      MEDICATIONS: No current facility-administered medications for this encounter.    . calcium-vitamin D (OSCAL WITH D) 500-200 MG-UNIT tablet  . cephALEXin (KEFLEX) 500 MG capsule  . clonazePAM (KLONOPIN) 0.5 MG tablet  . escitalopram (LEXAPRO) 20 MG tablet  . hydrochlorothiazide (HYDRODIURIL) 12.5 MG tablet  . HYDROcodone-acetaminophen (NORCO) 5-325 MG tablet  . Insulin Glargine (TOUJEO SOLOSTAR) 300 UNIT/ML SOPN  . losartan (COZAAR) 100 MG tablet  . magic mouthwash SOLN  . Melatonin 5 MG TABS  . mirtazapine (REMERON) 15 MG tablet  . Multiple Vitamin (MULTIVITAMIN) tablet  . pregabalin (LYRICA) 50 MG capsule  . PREVIDENT 5000 DRY MOUTH 1.1 % GEL dental gel  . promethazine (PHENERGAN) 12.5 MG tablet  . Rivaroxaban (XARELTO) 15 MG TABS tablet  . BD PEN NEEDLE NANO U/F 32G X 4 MM MISC  . mupirocin ointment (BACTROBAN) 2 %  . ondansetron (ZOFRAN) 4 MG tablet  . ondansetron (ZOFRAN-ODT) 4 MG disintegrating tablet  . oxyCODONE-acetaminophen (PERCOCET) 5-325 MG tablet  . traMADol  (ULTRAM) 50 MG tablet  . zinc sulfate 220 (50 Zn) MG capsule    Myra Gianotti, PA-C Surgical Short Stay/Anesthesiology [facility] Phone [phone] [facility] Phone [phone] 11/03/2018 10:13 AM

## 2018-11-03 NOTE — Anesthesia Preprocedure Evaluation (Addendum)
Anesthesia Evaluation  Patient identified by MRN, date of birth, ID band Patient awake    Reviewed: Allergy & Precautions, NPO status , Patient's Chart, lab work & pertinent test results  Airway Mallampati: II  TM Distance: >3 FB Neck ROM: Full    Dental  (+) Teeth Intact, Dental Advisory Given Upper and lower permanent bridges :   Pulmonary COPD, Current Smoker,    Pulmonary exam normal breath sounds clear to auscultation       Cardiovascular hypertension, Pt. on medications (-) Past MI and (-) Cardiac Stents Normal cardiovascular exam+ dysrhythmias Atrial Fibrillation + pacemaker  Rhythm:Regular Rate:Normal  Complete heart block s/p PPM   Neuro/Psych  Headaches, PSYCHIATRIC DISORDERS Anxiety Depression TIA   GI/Hepatic   Endo/Other  diabetes, Type 2, Insulin Dependent  Renal/GU Renal InsufficiencyRenal disease     Musculoskeletal  (+) Arthritis , left elbow wound dehiscence   Abdominal   Peds  Hematology  (+) Blood dyscrasia (Xarelto), ,   Anesthesia Other Findings   Reproductive/Obstetrics                            Anesthesia Physical Anesthesia Plan  ASA: IV  Anesthesia Plan: General   Post-op Pain Management:    Induction: Intravenous  PONV Risk Score and Plan: 2 and Dexamethasone, Ondansetron and Treatment may vary due to age or medical condition  Airway Management Planned: LMA  Additional Equipment:   Intra-op Plan:   Post-operative Plan: Extubation in OR  Informed Consent: I have reviewed the patients History and Physical, chart, labs and discussed the procedure including the risks, benefits and alternatives for the proposed anesthesia with the patient or authorized representative who has indicated his/her understanding and acceptance.     Dental advisory given  Plan Discussed with: CRNA  Anesthesia Plan Comments: (PAT note written 11/03/2018 by Myra Gianotti,  PA-C. )       Anesthesia Quick Evaluation

## 2018-11-03 NOTE — Progress Notes (Addendum)
Patient informed of the [place] that is currently in effect.  Patient verbalized understanding.  Patient denies shortness of breath, fever, cough and chest pain.  PCP - Dr Antony Contras at [place] - Dr Skains/Dr Caryl Comes  Chest x-ray - Denies EKG - 07/22/18 Stress Test - Denies ECHO -Denies  Cardiac Cath - Denies  Sleep Study - Denies CPAP - N/A  Fasting Blood Sugar - unknown Checks Blood Sugar __0___ times a day   WHAT DO I DO ABOUT MY DIABETES MEDICATION?    . THE MORNING OF SURGERY, take _______2______ units of __toujeo________insulin.  o Check your blood sugar the morning of your surgery when you wake up and every 2 hours until you get to the Short Stay unit. . If your blood sugar is less than 70 mg/dL, you will need to treat for low blood sugar: o Do not take insulin. o Treat a low blood sugar (less than 70 mg/dL) with  cup of clear juice (cranberry or apple) o Recheck blood sugar in 15 minutes after treatment (to make sure it is greater than 70 mg/dL). If your blood sugar is not greater than 70 mg/dL on recheck, call [phone] for further instructions. . Report your blood sugar to the short stay nurse when you get to Short Stay.   Blood Thinner Instructions: xarelto last dose 11/02/18 per patient  Anesthesia review: [name], [place]  STOP now taking any Aspirin (unless otherwise instructed by your surgeon), Aleve, Naproxen, Ibuprofen, Motrin, Advil, Goody's, BC's, all herbal medications, fish oil, and all vitamins.  Coronavirus Screening Have you or your husband [name] experienced the following symptoms:  Cough yes/no: No Fever (>100.110F)  yes/no: No Runny nose yes/no: No Sore throat yes/no: No Difficulty breathing/shortness of breath  yes/no: No  Have you or your husband [name] traveled in the last 14 days and where? yes/no: No

## 2018-11-04 ENCOUNTER — Encounter (HOSPITAL_COMMUNITY)
Admission: RE | Disposition: A | Discharge status: Home / Self Care | Payer: Self-pay | Source: Home / Self Care | Attending: Orthopaedic Surgery

## 2018-11-04 ENCOUNTER — Inpatient Hospital Stay (HOSPITAL_COMMUNITY): Payer: Medicare Other | Admitting: Vascular Surgery

## 2018-11-04 ENCOUNTER — Other Ambulatory Visit: Payer: Self-pay

## 2018-11-04 ENCOUNTER — Ambulatory Visit: Payer: Medicare Other | Admitting: Orthopaedic Surgery

## 2018-11-04 ENCOUNTER — Encounter (HOSPITAL_COMMUNITY): Payer: Self-pay

## 2018-11-04 ENCOUNTER — Ambulatory Visit (HOSPITAL_COMMUNITY)
Admission: RE | Admit: 2018-11-04 | Discharge: 2018-11-04 | Disposition: A | Discharge status: Home / Self Care | Payer: Medicare Other | Attending: Orthopaedic Surgery | Admitting: Orthopaedic Surgery

## 2018-11-04 DIAGNOSIS — Z794 Long term (current) use of insulin: Secondary | ICD-10-CM | POA: Diagnosis not present

## 2018-11-04 DIAGNOSIS — T8131XA Disruption of external operation (surgical) wound, not elsewhere classified, initial encounter: Secondary | ICD-10-CM | POA: Diagnosis not present

## 2018-11-04 DIAGNOSIS — X58XXXA Exposure to other specified factors, initial encounter: Secondary | ICD-10-CM | POA: Diagnosis not present

## 2018-11-04 DIAGNOSIS — Z8673 Personal history of transient ischemic attack (TIA), and cerebral infarction without residual deficits: Secondary | ICD-10-CM | POA: Insufficient documentation

## 2018-11-04 DIAGNOSIS — Z7901 Long term (current) use of anticoagulants: Secondary | ICD-10-CM | POA: Insufficient documentation

## 2018-11-04 DIAGNOSIS — M8580 Other specified disorders of bone density and structure, unspecified site: Secondary | ICD-10-CM | POA: Insufficient documentation

## 2018-11-04 DIAGNOSIS — N183 Chronic kidney disease, stage 3 (moderate): Secondary | ICD-10-CM | POA: Diagnosis not present

## 2018-11-04 DIAGNOSIS — Z950 Presence of cardiac pacemaker: Secondary | ICD-10-CM | POA: Insufficient documentation

## 2018-11-04 DIAGNOSIS — F17210 Nicotine dependence, cigarettes, uncomplicated: Secondary | ICD-10-CM | POA: Diagnosis not present

## 2018-11-04 DIAGNOSIS — F329 Major depressive disorder, single episode, unspecified: Secondary | ICD-10-CM | POA: Insufficient documentation

## 2018-11-04 DIAGNOSIS — F419 Anxiety disorder, unspecified: Secondary | ICD-10-CM | POA: Diagnosis not present

## 2018-11-04 DIAGNOSIS — T8131XD Disruption of external operation (surgical) wound, not elsewhere classified, subsequent encounter: Secondary | ICD-10-CM

## 2018-11-04 DIAGNOSIS — I10 Essential (primary) hypertension: Secondary | ICD-10-CM | POA: Diagnosis not present

## 2018-11-04 DIAGNOSIS — S52022A Displaced fracture of olecranon process without intraarticular extension of left ulna, initial encounter for closed fracture: Secondary | ICD-10-CM | POA: Diagnosis not present

## 2018-11-04 DIAGNOSIS — I4821 Permanent atrial fibrillation: Secondary | ICD-10-CM | POA: Diagnosis not present

## 2018-11-04 DIAGNOSIS — Z79899 Other long term (current) drug therapy: Secondary | ICD-10-CM | POA: Diagnosis not present

## 2018-11-04 DIAGNOSIS — I951 Orthostatic hypotension: Secondary | ICD-10-CM | POA: Diagnosis not present

## 2018-11-04 DIAGNOSIS — J449 Chronic obstructive pulmonary disease, unspecified: Secondary | ICD-10-CM | POA: Insufficient documentation

## 2018-11-04 DIAGNOSIS — T8130XA Disruption of wound, unspecified, initial encounter: Secondary | ICD-10-CM | POA: Diagnosis not present

## 2018-11-04 DIAGNOSIS — Z823 Family history of stroke: Secondary | ICD-10-CM | POA: Insufficient documentation

## 2018-11-04 DIAGNOSIS — E119 Type 2 diabetes mellitus without complications: Secondary | ICD-10-CM | POA: Diagnosis not present

## 2018-11-04 DIAGNOSIS — E1122 Type 2 diabetes mellitus with diabetic chronic kidney disease: Secondary | ICD-10-CM | POA: Diagnosis not present

## 2018-11-04 DIAGNOSIS — I6523 Occlusion and stenosis of bilateral carotid arteries: Secondary | ICD-10-CM | POA: Diagnosis not present

## 2018-11-04 DIAGNOSIS — M4690 Unspecified inflammatory spondylopathy, site unspecified: Secondary | ICD-10-CM | POA: Insufficient documentation

## 2018-11-04 DIAGNOSIS — I129 Hypertensive chronic kidney disease with stage 1 through stage 4 chronic kidney disease, or unspecified chronic kidney disease: Secondary | ICD-10-CM | POA: Diagnosis not present

## 2018-11-04 HISTORY — PX: I & D EXTREMITY: SHX5045

## 2018-11-04 HISTORY — DX: Unspecified osteoarthritis, unspecified site: M19.90

## 2018-11-04 HISTORY — DX: Dependence on other enabling machines and devices: Z99.89

## 2018-11-04 HISTORY — DX: Anxiety disorder, unspecified: F41.9

## 2018-11-04 LAB — CBC
HCT: 39.3 % (ref 36.0–46.0)
Hemoglobin: 12.5 g/dL (ref 12.0–15.0)
MCH: 30.7 pg (ref 26.0–34.0)
MCHC: 31.8 g/dL (ref 30.0–36.0)
MCV: 96.6 fL (ref 80.0–100.0)
Platelets: 264 10*3/uL (ref 150–400)
RBC: 4.07 MIL/uL (ref 3.87–5.11)
RDW: 12.6 % (ref 11.5–15.5)
WBC: 6.2 10*3/uL (ref 4.0–10.5)
nRBC: 0 % (ref 0.0–0.2)

## 2018-11-04 LAB — BASIC METABOLIC PANEL
Anion gap: 9 (ref 5–15)
BUN: 14 mg/dL (ref 8–23)
CO2: 30 mmol/L (ref 22–32)
Calcium: 10.4 mg/dL — ABNORMAL HIGH (ref 8.9–10.3)
Chloride: 95 mmol/L — ABNORMAL LOW (ref 98–111)
Creatinine, Ser: 1.28 mg/dL — ABNORMAL HIGH (ref 0.44–1.00)
GFR calc Af Amer: 44 mL/min — ABNORMAL LOW (ref >60)
GFR calc non Af Amer: 38 mL/min — ABNORMAL LOW (ref >60)
Glucose, Bld: 132 mg/dL — ABNORMAL HIGH (ref 70–99)
Potassium: 3.8 mmol/L (ref 3.5–5.1)
Sodium: 134 mmol/L — ABNORMAL LOW (ref 135–145)

## 2018-11-04 LAB — GLUCOSE, CAPILLARY
Glucose-Capillary: 132 mg/dL — ABNORMAL HIGH (ref 70–99)
Glucose-Capillary: 104 mg/dL — ABNORMAL HIGH (ref 70–99)
Glucose-Capillary: 96 mg/dL (ref 70–99)

## 2018-11-04 LAB — PROTIME-INR
INR: 1.1 (ref 0.8–1.2)
Prothrombin Time: 13.9 seconds (ref 11.4–15.2)

## 2018-11-04 SURGERY — IRRIGATION AND DEBRIDEMENT EXTREMITY
Anesthesia: General | Laterality: Left

## 2018-11-04 MED ORDER — OXYCODONE-ACETAMINOPHEN 5-325 MG PO TABS: 1.0000 | ORAL_TABLET | Freq: Four times a day (QID) | ORAL | 0 refills | Status: DC | PRN

## 2018-11-04 MED ORDER — LABETALOL HCL 5 MG/ML IV SOLN
5.0000 mg | Freq: Once | INTRAVENOUS | Status: AC
  Administered 2018-11-04: 5 mg via INTRAVENOUS

## 2018-11-04 MED ORDER — LIDOCAINE 2% (20 MG/ML) 5 ML SYRINGE
INTRAMUSCULAR | Status: AC
  Filled 2018-11-04: qty 5

## 2018-11-04 MED ORDER — FENTANYL CITRATE (PF) 250 MCG/5ML IJ SOLN
INTRAMUSCULAR | Status: DC | PRN
  Administered 2018-11-04: 25 ug via INTRAVENOUS

## 2018-11-04 MED ORDER — PROPOFOL 10 MG/ML IV BOLUS
INTRAVENOUS | Status: DC | PRN
  Administered 2018-11-04: 80 mg via INTRAVENOUS

## 2018-11-04 MED ORDER — ACETAMINOPHEN 325 MG PO TABS
650.0000 mg | ORAL_TABLET | Freq: Once | ORAL | Status: AC
  Administered 2018-11-04: 12:00:00 650 mg via ORAL

## 2018-11-04 MED ORDER — LACTATED RINGERS IV SOLN
INTRAVENOUS | Status: DC
  Administered 2018-11-04: 13:00:00 via INTRAVENOUS

## 2018-11-04 MED ORDER — SODIUM CHLORIDE 0.9 % IR SOLN
Status: DC | PRN
  Administered 2018-11-04: 3000 mL

## 2018-11-04 MED ORDER — CEFAZOLIN SODIUM-DEXTROSE 2-4 GM/100ML-% IV SOLN
2.0000 g | INTRAVENOUS | Status: AC
  Administered 2018-11-04: 2 g via INTRAVENOUS
  Filled 2018-11-04: qty 100

## 2018-11-04 MED ORDER — ACETAMINOPHEN 325 MG PO TABS
ORAL_TABLET | ORAL | Status: AC
  Administered 2018-11-04: 650 mg via ORAL
  Filled 2018-11-04: qty 2

## 2018-11-04 MED ORDER — CHLORHEXIDINE GLUCONATE 4 % EX LIQD: 60.0000 mL | Freq: Once | CUTANEOUS | Status: DC

## 2018-11-04 MED ORDER — LIDOCAINE HCL (CARDIAC) PF 100 MG/5ML IV SOSY
PREFILLED_SYRINGE | INTRAVENOUS | Status: DC | PRN
  Administered 2018-11-04: 2 mL via INTRATRACHEAL

## 2018-11-04 MED ORDER — LABETALOL HCL 5 MG/ML IV SOLN: 10.0000 mg | INTRAVENOUS | Status: DC | PRN

## 2018-11-04 MED ORDER — ONDANSETRON HCL 4 MG/2ML IJ SOLN
INTRAMUSCULAR | Status: DC | PRN
  Administered 2018-11-04: 4 mg via INTRAVENOUS

## 2018-11-04 MED ORDER — FENTANYL CITRATE (PF) 250 MCG/5ML IJ SOLN
INTRAMUSCULAR | Status: AC
  Filled 2018-11-04: qty 5

## 2018-11-04 MED ORDER — LABETALOL HCL 5 MG/ML IV SOLN
INTRAVENOUS | Status: AC
  Filled 2018-11-04: qty 4

## 2018-11-04 MED ORDER — DEXAMETHASONE SODIUM PHOSPHATE 10 MG/ML IJ SOLN
INTRAMUSCULAR | Status: DC | PRN
  Administered 2018-11-04: 5 mg via INTRAVENOUS

## 2018-11-04 SURGICAL SUPPLY — 49 items
BANDAGE ACE 3X5.8 VEL STRL LF (GAUZE/BANDAGES/DRESSINGS) IMPLANT
BANDAGE ACE 4X5 VEL STRL LF (GAUZE/BANDAGES/DRESSINGS) ×3 IMPLANT
BNDG COHESIVE 4X5 TAN STRL (GAUZE/BANDAGES/DRESSINGS) ×3 IMPLANT
BNDG COHESIVE 6X5 TAN STRL LF (GAUZE/BANDAGES/DRESSINGS) ×6 IMPLANT
BNDG GAUZE STRTCH 6 (GAUZE/BANDAGES/DRESSINGS) ×3 IMPLANT
COVER SURGICAL LIGHT HANDLE (MISCELLANEOUS) ×3 IMPLANT
COVER WAND RF STERILE (DRAPES) ×3 IMPLANT
CUFF TOURNIQUET SINGLE 24IN (TOURNIQUET CUFF) IMPLANT
CUFF TOURNIQUET SINGLE 34IN LL (TOURNIQUET CUFF) IMPLANT
CUFF TOURNIQUET SINGLE 44IN (TOURNIQUET CUFF) IMPLANT
DRAPE U-SHAPE 47X51 STRL (DRAPES) ×3 IMPLANT
DURAPREP 26ML APPLICATOR (WOUND CARE) ×3 IMPLANT
ELECT REM PT RETURN 9FT ADLT (ELECTROSURGICAL)
ELECTRODE REM PT RTRN 9FT ADLT (ELECTROSURGICAL) IMPLANT
GAUZE SPONGE 4X4 12PLY STRL (GAUZE/BANDAGES/DRESSINGS) ×6 IMPLANT
GAUZE XEROFORM 5X9 LF (GAUZE/BANDAGES/DRESSINGS) ×3 IMPLANT
GLOVE BIOGEL PI IND STRL 7.0 (GLOVE) ×1 IMPLANT
GLOVE BIOGEL PI INDICATOR 7.0 (GLOVE) ×2
GLOVE ECLIPSE 7.0 STRL STRAW (GLOVE) ×3 IMPLANT
GLOVE SKINSENSE NS SZ7.5 (GLOVE) ×4
GLOVE SKINSENSE STRL SZ7.5 (GLOVE) ×2 IMPLANT
GOWN STRL REIN XL XLG (GOWN DISPOSABLE) ×6 IMPLANT
HANDPIECE INTERPULSE COAX TIP (DISPOSABLE)
KIT BASIN OR (CUSTOM PROCEDURE TRAY) ×3 IMPLANT
KIT TURNOVER KIT B (KITS) ×3 IMPLANT
MANIFOLD NEPTUNE II (INSTRUMENTS) ×3 IMPLANT
PACK ORTHO EXTREMITY (CUSTOM PROCEDURE TRAY) ×3 IMPLANT
PAD ABD 8X10 STRL (GAUZE/BANDAGES/DRESSINGS) ×3 IMPLANT
PAD ARMBOARD 7.5X6 YLW CONV (MISCELLANEOUS) ×6 IMPLANT
PAD CAST 4YDX4 CTTN HI CHSV (CAST SUPPLIES) ×1 IMPLANT
PADDING CAST ABS 4INX4YD NS (CAST SUPPLIES) ×2
PADDING CAST ABS COTTON 4X4 ST (CAST SUPPLIES) ×1 IMPLANT
PADDING CAST COTTON 4X4 STRL (CAST SUPPLIES) ×2
PADDING CAST COTTON 6X4 STRL (CAST SUPPLIES) ×3 IMPLANT
SET HNDPC FAN SPRY TIP SCT (DISPOSABLE) IMPLANT
SPONGE LAP 18X18 RF (DISPOSABLE) ×3 IMPLANT
STOCKINETTE IMPERVIOUS 9X36 MD (GAUZE/BANDAGES/DRESSINGS) ×3 IMPLANT
SUT ETHILON 2 0 FS 18 (SUTURE) ×3 IMPLANT
SUT ETHILON 2 0 PSLX (SUTURE) IMPLANT
SUT VIC AB 2-0 FS1 27 (SUTURE) ×6 IMPLANT
SWAB CULTURE ESWAB REG 1ML (MISCELLANEOUS) IMPLANT
TOWEL GREEN STERILE FF (TOWEL DISPOSABLE) ×3 IMPLANT
TOWEL OR 17X26 10 PK STRL BLUE (TOWEL DISPOSABLE) ×3 IMPLANT
TUBE CONNECTING 12'X1/4 (SUCTIONS) ×1
TUBE CONNECTING 12X1/4 (SUCTIONS) ×2 IMPLANT
TUBING TUR DISP (UROLOGICAL SUPPLIES) ×3 IMPLANT
UNDERPAD 30X30 (UNDERPADS AND DIAPERS) ×6 IMPLANT
WATER STERILE IRR 1000ML POUR (IV SOLUTION) ×3 IMPLANT
YANKAUER SUCT BULB TIP NO VENT (SUCTIONS) ×3 IMPLANT

## 2018-11-04 NOTE — Anesthesia Procedure Notes (Signed)
Procedure Name: LMA Insertion Date/Time: 11/04/2018 1:37 PM Performed by: Raenette Rover, CRNA Pre-anesthesia Checklist: Patient identified, Emergency Drugs available, Suction available and Patient being monitored Patient Re-evaluated:Patient Re-evaluated prior to induction Oxygen Delivery Method: Circle system utilized Preoxygenation: Pre-oxygenation with 100% oxygen Induction Type: IV induction LMA: LMA inserted LMA Size: 4.0 Number of attempts: 1 Placement Confirmation: positive ETCO2,  CO2 detector and breath sounds checked- equal and bilateral Tube secured with: Tape Dental Injury: Teeth and Oropharynx as per pre-operative assessment

## 2018-11-04 NOTE — Transfer of Care (Signed)
Immediate Anesthesia Transfer of Care Note  Patient: [name]  Procedure(s) Performed: IRRIGATION AND DEBRIDEMENT LEFT ELBOW, WOUND VAC (Left )  Patient Location: PACU  Anesthesia Type:General  Level of Consciousness: awake, alert , oriented and patient cooperative  Airway & Oxygen Therapy: Patient Spontanous Breathing and Patient connected to nasal cannula oxygen  Post-op Assessment: Report given to RN and Post -op Vital signs reviewed and stable  Post vital signs: Reviewed and stable  Last Vitals:  Vitals Value Taken Time  BP 161/90 11/04/18 1427  Temp    Pulse 78 11/04/18 1431  Resp 13 11/04/18 1431  SpO2 95 % 11/04/18 1431  Vitals shown include unvalidated device data.  Last Pain:  Vitals:   11/04/18 1129  TempSrc:   PainSc: 0-No pain      Patients Stated Pain Goal: 0 (76/39/43 2003)  Complications: No apparent anesthesia complications

## 2018-11-04 NOTE — H&P (Signed)
PREOPERATIVE H&P  Chief Complaint: left elbow wound dehiscence  HPI: [name] is a 85 y.o. female who presents for surgical treatment of left elbow wound dehiscence.  She denies any changes in medical history.  Past Medical History:  Diagnosis Date  . Ambulates with cane    straight  . Anxiety   . Arthritis    lower back  . CKD (chronic kidney disease), stage III ([place])   . Closed fracture of left olecranon process   . COPD (chronic obstructive pulmonary disease) ([place])   . Depression   . Diabetes mellitus without complication ([place])    type 2  . Headache   . Hypertension   . Orthostatic hypotension   . Osteopenia   . Pacemaker   . 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 Permanent atrial fibrillation    a.  s/p AVN ablation 2010 with complete heart block s/p PPM (gen change 2015, pt preference for Coumadin).  . Protein calorie malnutrition ([place])   . S/P AV nodal ablation   . TIA (transient ischemic attack)    Past Surgical History:  Procedure Laterality Date  . ABLATION  2012   AVN ablation by Dr Lovena Le  . cateracts Bilateral    eyes  . CYST REMOVAL TRUNK     from breast  . ORIF ELBOW FRACTURE Left 10/07/2018   Procedure: OPEN REDUCTION INTERNAL FIXATION (ORIF) LEFT OLECRANON FRACTURE;  Surgeon: Leandrew Koyanagi, MD;  Location: [place];  Service: Orthopedics;  Laterality: Left;  . PACEMAKER INSERTION    . PERMANENT PACEMAKER GENERATOR CHANGE N/A 07/18/2013   Procedure: PERMANENT PACEMAKER GENERATOR CHANGE;  Surgeon: Deboraha Sprang, MD;  Location: [facility] C[facility] LAB;  Service: Cardiovascular;  Laterality: N/A;  . WISDOM TOOTH EXTRACTION     Social History   Socioeconomic History  . Marital status: Married    Spouse name: [name]  . Number of children: 0  . Years of education: 83  . 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 Financial resource  strain: Not on file  . Food insecurity    Worry: Not on file    Inability: Not on file  . Transportation needs    Medical: Not on file    Non-medical: Not on file  Tobacco Use  . Smoking status: Current Every Day Smoker    Packs/day: 0.50    Years: 67.00    Pack years: 33.50    Types: Cigarettes  . Smokeless tobacco: Never Used  Substance and Sexual Activity  . Alcohol use: No    Alcohol/week: 0.0 standard drinks  . Drug use: No  . Sexual activity: Not on file  Lifestyle  . Physical activity    Days per week: Not on file    Minutes per session: Not on file  . Stress: Not on file  Relationships  . Social Herbalist on phone: Not on file    Gets together: Not on file    Attends religious service: Not on file    Active member of club or organization: Not on file    Attends meetings of clubs or organizations: Not on file    Relationship status: Not on file  Other Topics Concern  . Not on file  Social History Narrative   Lives at home with husband.  Education PHDD.  No children.  Family History  Problem Relation Age of Onset  . Stroke Father    Allergies  Allergen Reactions  . Eliquis [Apixaban] Other (See Comments)    Made pt have weak muscles  . Gabapentin Nausea Only and Other (See Comments)    SHAKES AND NAUSEA   . Rozerem [Ramelteon] Other (See Comments)    DIZZY AND HEAD THROBBING  . Tikosyn [Dofetilide] Itching and Swelling    Mouth swells, itching  . Tradjenta [Linagliptin] Diarrhea and Other (See Comments)    Joint Pain, Stomach issues  . Temazepam Other (See Comments)    INSOMNIA   Prior to Admission medications   Medication Sig Start Date End Date Taking? Authorizing Provider  BD PEN NEEDLE NANO U/F 32G X 4 MM MISC AS DIRECTED USE WITH TOUJEO/ DX E11.49 IN VITRO 01/21/18  Yes [provider]  calcium-vitamin D (OSCAL WITH D) 500-200 MG-UNIT tablet Take 1 tablet by mouth 3 (three) times daily. 10/07/18  Yes Leandrew Koyanagi, MD   cephALEXin (KEFLEX) 500 MG capsule Take 1 capsule (500 mg total) by mouth 4 (four) times daily. 10/28/18  Yes Leandrew Koyanagi, MD  clonazePAM (KLONOPIN) 0.5 MG tablet Take 0.5 mg by mouth at bedtime. 01/02/17  Yes [provider]  escitalopram (LEXAPRO) 20 MG tablet Take 20 mg by mouth daily. 02/10/15  Yes [provider]  hydrochlorothiazide (HYDRODIURIL) 12.5 MG tablet TAKE 0.5 TABLETS (6.25 MG TOTAL) BY MOUTH DAILY. 10/27/18  Yes [name], MD  HYDROcodone-acetaminophen (NORCO) 5-325 MG tablet Take 1-2 tabs q 6-8 hours prn pain (only take when not taking tramadol) Patient taking differently: Take 0.5 tablets by mouth every 8 (eight) hours as needed for severe pain. (only take when not taking tramadol) 11/02/18  Yes Aundra Dubin, PA-C  Insulin Glargine (TOUJEO SOLOSTAR) 300 UNIT/ML SOPN Inject 4 Units into the skin daily.    Yes [provider]  losartan (COZAAR) 100 MG tablet Take 100 mg by mouth daily.   Yes [provider]  magic mouthwash SOLN Take 5 mLs by mouth 3 (three) times daily.  03/08/18  Yes [provider]  Melatonin 5 MG TABS Take 5 mg by mouth at bedtime.    Yes [provider]  mirtazapine (REMERON) 15 MG tablet Take 15 mg by mouth at bedtime. 10/01/18  Yes [provider]  Multiple Vitamin (MULTIVITAMIN) tablet Take 1 tablet by mouth daily.   Yes [provider]  pregabalin (LYRICA) 50 MG capsule Take 1 capsule (50 mg total) by mouth daily. Patient taking differently: Take 50 mg by mouth at bedtime.  10/18/18  Yes [name], DPM  PREVIDENT 5000 DRY MOUTH 1.1 % GEL dental gel Take 1 application by mouth See admin instructions. Use to brush teeth every day, replacement for regular toothpaste 08/23/18  Yes [provider]  promethazine (PHENERGAN) 12.5 MG tablet Take 1 tablet (12.5 mg total) by mouth 3 (three) times daily as needed for nausea or vomiting. 11/02/18  Yes Aundra Dubin, PA-C   Rivaroxaban (XARELTO) 15 MG TABS tablet Take 1 tablet (15 mg total) by mouth daily with supper. 09/29/18  Yes Deboraha Sprang, MD  mupirocin ointment Drue Stager) 2 % Apply to incision twice daily Patient not taking: Reported on 11/02/2018 10/14/18   Aundra Dubin, PA-C  ondansetron (ZOFRAN) 4 MG tablet Take 1-2 tablets (4-8 mg total) by mouth every 8 (eight) hours as needed for nausea or vomiting. Patient not taking: Reported on 11/02/2018 10/07/18  Leandrew Koyanagi, MD  ondansetron (ZOFRAN-ODT) 4 MG disintegrating tablet Take 1 tablet (4 mg total) by mouth every 8 (eight) hours as needed for nausea or vomiting. Patient not taking: Reported on 11/02/2018 10/14/18   Aundra Dubin, PA-C  oxyCODONE-acetaminophen (PERCOCET) 5-325 MG tablet Take 1-2 tablets by mouth every 6 (six) hours as needed for severe pain. Patient not taking: Reported on 11/02/2018 10/07/18   Leandrew Koyanagi, MD  oxyCODONE-acetaminophen (PERCOCET) 5-325 MG tablet Take 1-2 tablets by mouth every 6 (six) hours as needed for severe pain. 11/04/18   Leandrew Koyanagi, MD  traMADol Veatrice Bourbon) 50 MG tablet Take 1-2 tabs po q6-8 hours prn pain (only take if not taking hydrocodone) Patient not taking: Reported on 11/02/2018 10/14/18   Aundra Dubin, PA-C  zinc sulfate 220 (50 Zn) MG capsule Take 1 capsule (220 mg total) by mouth daily. Patient not taking: Reported on 11/02/2018 10/07/18   Leandrew Koyanagi, MD     Positive ROS: All other systems have been reviewed and were otherwise negative with the exception of those mentioned in the HPI and as above.  Physical Exam: General: Alert, no acute distress Cardiovascular: No pedal edema Respiratory: No cyanosis, no use of accessory musculature GI: abdomen soft Skin: No lesions in the area of chief complaint Neurologic: Sensation intact distally Psychiatric: Patient is competent for consent with normal mood and affect Lymphatic: no lymphedema  MUSCULOSKELETAL: exam stable  Assessment: left elbow  wound dehiscence  Plan: Plan for Procedure(s): IRRIGATION AND DEBRIDEMENT LEFT ELBOW, WOUND VAC  The risks benefits and alternatives were discussed with the patient including but not limited to the risks of nonoperative treatment, versus surgical intervention including infection, bleeding, nerve injury,  blood clots, cardiopulmonary complications, morbidity, mortality, among others, and they were willing to proceed.   Eduard Roux, MD   11/04/2018 1:18 PM

## 2018-11-04 NOTE — Anesthesia Postprocedure Evaluation (Signed)
Anesthesia Post Note  Patient: [name]  Procedure(s) Performed: IRRIGATION AND DEBRIDEMENT LEFT ELBOW, WOUND VAC (Left )     Patient location during evaluation: PACU Anesthesia Type: General Level of consciousness: awake and alert and awake Pain management: pain level controlled Vital Signs Assessment: post-procedure vital signs reviewed and stable Respiratory status: spontaneous breathing, nonlabored ventilation, respiratory function stable and patient connected to nasal cannula oxygen Cardiovascular status: blood pressure returned to baseline and stable Postop Assessment: no apparent nausea or vomiting Anesthetic complications: no    Last Vitals:  Vitals:   11/04/18 1527 11/04/18 1530  BP: (!) 145/72   Pulse: 80 80  Resp: 17 (!) 24  Temp:  (!) 36.2 C  SpO2: 91% 95%    Last Pain:  Vitals:   11/04/18 1530  TempSrc:   PainSc: 0-No pain                 Catalina Gravel

## 2018-11-04 NOTE — Discharge Instructions (Signed)

## 2018-11-04 NOTE — Op Note (Signed)
   Date of Surgery: 11/04/2018  INDICATIONS: Ms. Archambeau is a 85 y.o.-year-old female with a left elbow surgical wound dehiscence;  The patient did consent to the procedure after discussion of the risks and benefits.  PREOPERATIVE DIAGNOSIS: Left elbow surgical wound dehiscence with exposed hardware  POSTOPERATIVE DIAGNOSIS: Same.  PROCEDURE:  1.  Excision of skin, subcutaneous tissue, tendon left elbow less than 20 cm 2.  Secondary closure of left elbow surgical wound dehiscence  SURGEON: N. Eduard Roux, M.D.  ASSIST: none  ANESTHESIA:  general  IV FLUIDS AND URINE: See anesthesia.  ESTIMATED BLOOD LOSS: minimal mL.  IMPLANTS: none  DRAINS: none  COMPLICATIONS: see description of procedure.  DESCRIPTION OF PROCEDURE: The patient was brought to the operating room and placed supine on the operating table.  The patient had been signed prior to the procedure and this was documented. The patient had the anesthesia placed by the anesthesiologist.  A time-out was performed to confirm that this was the correct patient, site, side and location. The patient did receive antibiotics prior to the incision and was re-dosed during the procedure as needed at indicated intervals.  A tourniquet was placed.  The patient had the operative extremity prepped and draped in the standard surgical fashion.    I first began by ellipsing out the nonviable skin edges just peripheral to the wound dehiscence with a 15 blade.  This was done sharply down onto the hardware.  I then performed sharp excisional debridement of the subcutaneous tissue as well as the triceps tendon using a rondure.  All this was debrided back to healthy tissue.  There is no exposed bone.  I then thoroughly irrigated the wound with 3 L of normal saline using pulse lavage.  I was then able to perform secondary closure of the surgical wound dehiscence using interrupted 3-0 nylon sutures in a far near near far pattern.  The skin edges were  approximated without tension.  Sterile dressings were applied.  The elbow was immobilized in a long-arm splint in 60 degrees.  Patient tolerated the procedure well had no immediate complications.  POSTOPERATIVE PLAN: Patient will be discharged home and follow-up in 1 week for wound check.  Azucena Cecil, MD [facility] [phone] 2:18 PM

## 2018-11-05 ENCOUNTER — Encounter (HOSPITAL_COMMUNITY): Payer: Self-pay | Admitting: Orthopaedic Surgery

## 2018-11-11 ENCOUNTER — Encounter: Payer: Self-pay | Admitting: Orthopaedic Surgery

## 2018-11-11 ENCOUNTER — Other Ambulatory Visit (HOSPITAL_COMMUNITY)
Admission: RE | Admit: 2018-11-11 | Discharge: 2018-11-11 | Disposition: A | Discharge status: Home / Self Care | Payer: Medicare Other | Source: Ambulatory Visit | Attending: Orthopaedic Surgery | Admitting: Orthopaedic Surgery

## 2018-11-11 ENCOUNTER — Other Ambulatory Visit: Payer: Self-pay

## 2018-11-11 ENCOUNTER — Ambulatory Visit (INDEPENDENT_AMBULATORY_CARE_PROVIDER_SITE_OTHER): Payer: Medicare Other

## 2018-11-11 ENCOUNTER — Ambulatory Visit (INDEPENDENT_AMBULATORY_CARE_PROVIDER_SITE_OTHER): Payer: Medicare Other | Admitting: Orthopaedic Surgery

## 2018-11-11 ENCOUNTER — Telehealth: Payer: Self-pay | Admitting: Orthopaedic Surgery

## 2018-11-11 DIAGNOSIS — Z1159 Encounter for screening for other viral diseases: Secondary | ICD-10-CM | POA: Insufficient documentation

## 2018-11-11 DIAGNOSIS — S52022S Displaced fracture of olecranon process without intraarticular extension of left ulna, sequela: Secondary | ICD-10-CM | POA: Diagnosis not present

## 2018-11-11 LAB — SARS CORONAVIRUS 2 (TAT 6-24 HRS): SARS Coronavirus 2: NEGATIVE

## 2018-11-11 NOTE — Telephone Encounter (Signed)
Patient called asked when should she stop taking Xarelto? Patient said she is having surgery Monday. The number to contact patient is [phone]

## 2018-11-11 NOTE — Telephone Encounter (Signed)
Please advise. Thanks.  

## 2018-11-11 NOTE — Progress Notes (Signed)
Post-Op Visit Note   Patient: [name]           Date of Birth: Nov 26, 1928           MRN: [id number] Visit Date: 11/11/2018 PCP: Antony Contras, MD   Assessment & Plan:  Visit Diagnoses:  1. Closed fracture of left olecranon process, sequela     Plan: Overall is 1 week status post left elbow I&D and closure of her wound dehiscence.  She comes in for wound check.  Unfortunately the skin over the tip of the olecranon and the plate has dehisced again and there is exposed hardware.  At this point we will have to take her back to the operating room again for repeat washout and placement of wound VAC with possible removal of the hardware based on stability of the fracture.  We will admit her postoperatively.  Patient in agreement with the plan.  Alternatives and risks and benefits were discussed.  She remains on antibiotics and she is doing well with these.  Follow-Up Instructions: Return if symptoms worsen or fail to improve.   Orders:  Orders Placed This Encounter  Procedures  . XR Elbow 2 Views Left   No orders of the defined types were placed in this encounter.   Imaging: Xr Elbow 2 Views Left  Result Date: 11/11/2018 Stable fixation of olecranon fracture.  No complications of the hardware   PMFS History: Patient Active Problem List   Diagnosis Date Noted  . Wound dehiscence, surgical 11/04/2018  . Closed fracture of left olecranon process, initial encounter 09/20/2018  . Chronic anticoagulation 02/10/2014  . Pure hypercholesterolemia 02/10/2014  . Encounter for therapeutic drug monitoring 06/15/2013  . Atrioventricular block, complete ([place]) 06/07/2013  . Pacemaker-Medtronic 06/07/2013  . Atrial fibrillation ([place]) 02/24/2013   Past Medical History:  Diagnosis Date  . Ambulates with cane    straight  . Anxiety   . Arthritis    lower back  . CKD (chronic kidney disease), stage III ([place])   . Closed fracture of left olecranon process   . COPD (chronic obstructive  pulmonary disease) ([place])   . Depression   . Diabetes mellitus without complication ([place])    type 2  . Headache   . Hypertension   . Orthostatic hypotension   . Osteopenia   . Pacemaker   . 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 Permanent atrial fibrillation    a.  s/p AVN ablation 2010 with complete heart block s/p PPM (gen change 2015, pt preference for Coumadin).  . Protein calorie malnutrition ([place])   . S/P AV nodal ablation   . TIA (transient ischemic attack)     Family History  Problem Relation Age of Onset  . Stroke Father     Past Surgical History:  Procedure Laterality Date  . ABLATION  2012   AVN ablation by Dr Lovena Le  . cateracts Bilateral    eyes  . CYST REMOVAL TRUNK     from breast  . I&D EXTREMITY Left 11/04/2018   Procedure: IRRIGATION AND DEBRIDEMENT LEFT ELBOW, WOUND VAC;  Surgeon: Leandrew Koyanagi, MD;  Location: [place];  Service: Orthopedics;  Laterality: Left;  . ORIF ELBOW FRACTURE Left 10/07/2018   Procedure: OPEN REDUCTION INTERNAL FIXATION (ORIF) LEFT OLECRANON FRACTURE;  Surgeon: Leandrew Koyanagi, MD;  Location: [place];  Service: Orthopedics;  Laterality: Left;  . PACEMAKER INSERTION    . PERMANENT PACEMAKER GENERATOR CHANGE N/A  07/18/2013   Procedure: PERMANENT PACEMAKER GENERATOR CHANGE;  Surgeon: Deboraha Sprang, MD;  Location[facility] CATH LAB;  Service: Cardiovascular;  Laterality: N/A;  . 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 Smoking status: Current Every Day Smoker    Packs/day: 0.50    Years: 67.00    Pack years: 33.50    Types: Cigarettes  . Smokeless tobacco: Never Used  Substance and Sexual Activity  . Alcohol use: No    Alcohol/week: 0.0 standard drinks  . Drug use: No  . Sexual activity: Not on file

## 2018-11-12 ENCOUNTER — Other Ambulatory Visit: Payer: Self-pay

## 2018-11-12 ENCOUNTER — Encounter (HOSPITAL_COMMUNITY): Payer: Self-pay | Admitting: *Deleted

## 2018-11-12 NOTE — Progress Notes (Signed)
Patient informed of the [place] that is currently in effect.  Patient verbalized understanding.  Patient denies shortness of breath, fever, cough and chest pain.  PCP - Dr Antony Contras at [place] - Dr Skains/Dr Caryl Comes Faxed Perioperative Prescription for ICD Email sent SECURE to [facility]  Chest x-ray - 2017 EKG - 07/22/18 Stress Test - Denies ECHO -01/2017 Cardiac Cath - Denies  Sleep Study - Denies CPAP - N/A  Fasting Blood Sugar - unknown Checks Blood Sugar __0___ times a day   WHAT DO I DO ABOUT MY DIABETES MEDICATION?                           THE MORNING OF SURGERY, take _______2______ units of __toujeo________insulin.  ? Check your blood sugar the morning of your surgery when you wake up and every 2 hours until you get to the Short Stay unit.  If your blood sugar is less than 70 mg/dL, you will need to treat for low blood sugar: ? Do not take insulin. ? Treat a low blood sugar (less than 70 mg/dL) with  cup of clear juice (cranberry or apple) ? Recheck blood sugar in 15 minutes after treatment (to make sure it is greater than 70 mg/dL). If your blood sugar is not greater than 70 mg/dL on recheck, call [phone] for further instructions.  Report your blood sugar to the short stay nurse when you get to Short Stay.   Blood Thinner Instructions: xarelto last dose Wednesday, 11/10/18.  Anesthesia review: Ebony Hail, [place] reviewed on 11/03/18.  No new updates.  STOP now taking any Aspirin (unless otherwise instructed by your surgeon), Aleve, Naproxen, Ibuprofen, Motrin, Advil, Goody's, BC's, all herbal medications, fish oil, and all vitamins.  Coronavirus Screening Have you or your husband [name] experienced the following symptoms:  Cough yes/no: No Fever (>100.69F)  yes/no: No Runny nose yes/no: No Sore throat yes/no: No Difficulty breathing/shortness of breath  yes/no: No  Have you or your husband [name] traveled in  the last 14 days and where? yes/no: No

## 2018-11-12 NOTE — Telephone Encounter (Signed)
Called to let her know

## 2018-11-12 NOTE — Telephone Encounter (Signed)
Will you call her and let her know asap

## 2018-11-12 NOTE — Telephone Encounter (Signed)
She should not take anymore.  Thanks.

## 2018-11-15 ENCOUNTER — Inpatient Hospital Stay (HOSPITAL_COMMUNITY)
Admission: RE | Admit: 2018-11-15 | Discharge: 2018-11-17 | DRG: 908 | Disposition: A | Discharge status: Home Health | Payer: Medicare Other | Attending: Orthopaedic Surgery | Admitting: Orthopaedic Surgery

## 2018-11-15 ENCOUNTER — Encounter (HOSPITAL_COMMUNITY): Payer: Self-pay | Admitting: *Deleted

## 2018-11-15 ENCOUNTER — Encounter (HOSPITAL_COMMUNITY)
Admission: RE | Disposition: A | Discharge status: Home Health | Payer: Self-pay | Source: Home / Self Care | Attending: Orthopaedic Surgery

## 2018-11-15 ENCOUNTER — Other Ambulatory Visit: Payer: Self-pay

## 2018-11-15 ENCOUNTER — Telehealth: Payer: Self-pay

## 2018-11-15 ENCOUNTER — Inpatient Hospital Stay (HOSPITAL_COMMUNITY): Payer: Medicare Other | Admitting: Certified Registered"

## 2018-11-15 ENCOUNTER — Encounter: Payer: Medicare Other | Admitting: *Deleted

## 2018-11-15 DIAGNOSIS — Z8673 Personal history of transient ischemic attack (TIA), and cerebral infarction without residual deficits: Secondary | ICD-10-CM | POA: Diagnosis not present

## 2018-11-15 DIAGNOSIS — Z888 Allergy status to other drugs, medicaments and biological substances status: Secondary | ICD-10-CM | POA: Diagnosis not present

## 2018-11-15 DIAGNOSIS — T8131XS Disruption of external operation (surgical) wound, not elsewhere classified, sequela: Secondary | ICD-10-CM

## 2018-11-15 DIAGNOSIS — Z950 Presence of cardiac pacemaker: Secondary | ICD-10-CM | POA: Diagnosis not present

## 2018-11-15 DIAGNOSIS — T8132XA Disruption of internal operation (surgical) wound, not elsewhere classified, initial encounter: Secondary | ICD-10-CM | POA: Diagnosis not present

## 2018-11-15 DIAGNOSIS — N183 Chronic kidney disease, stage 3 (moderate): Secondary | ICD-10-CM | POA: Diagnosis not present

## 2018-11-15 DIAGNOSIS — T8189XA Other complications of procedures, not elsewhere classified, initial encounter: Secondary | ICD-10-CM | POA: Diagnosis not present

## 2018-11-15 DIAGNOSIS — S51002A Unspecified open wound of left elbow, initial encounter: Secondary | ICD-10-CM | POA: Diagnosis not present

## 2018-11-15 DIAGNOSIS — I4891 Unspecified atrial fibrillation: Secondary | ICD-10-CM | POA: Diagnosis not present

## 2018-11-15 DIAGNOSIS — Z9889 Other specified postprocedural states: Secondary | ICD-10-CM | POA: Diagnosis not present

## 2018-11-15 DIAGNOSIS — J449 Chronic obstructive pulmonary disease, unspecified: Secondary | ICD-10-CM | POA: Diagnosis present

## 2018-11-15 DIAGNOSIS — Z7901 Long term (current) use of anticoagulants: Secondary | ICD-10-CM | POA: Diagnosis not present

## 2018-11-15 DIAGNOSIS — M8580 Other specified disorders of bone density and structure, unspecified site: Secondary | ICD-10-CM | POA: Diagnosis not present

## 2018-11-15 DIAGNOSIS — Z794 Long term (current) use of insulin: Secondary | ICD-10-CM

## 2018-11-15 DIAGNOSIS — M1990 Unspecified osteoarthritis, unspecified site: Secondary | ICD-10-CM | POA: Diagnosis not present

## 2018-11-15 DIAGNOSIS — I129 Hypertensive chronic kidney disease with stage 1 through stage 4 chronic kidney disease, or unspecified chronic kidney disease: Secondary | ICD-10-CM | POA: Diagnosis not present

## 2018-11-15 DIAGNOSIS — E1122 Type 2 diabetes mellitus with diabetic chronic kidney disease: Secondary | ICD-10-CM | POA: Diagnosis present

## 2018-11-15 DIAGNOSIS — Z823 Family history of stroke: Secondary | ICD-10-CM

## 2018-11-15 DIAGNOSIS — T8131XA Disruption of external operation (surgical) wound, not elsewhere classified, initial encounter: Secondary | ICD-10-CM | POA: Diagnosis not present

## 2018-11-15 DIAGNOSIS — F17210 Nicotine dependence, cigarettes, uncomplicated: Secondary | ICD-10-CM | POA: Diagnosis not present

## 2018-11-15 DIAGNOSIS — I4821 Permanent atrial fibrillation: Secondary | ICD-10-CM | POA: Diagnosis not present

## 2018-11-15 DIAGNOSIS — I10 Essential (primary) hypertension: Secondary | ICD-10-CM | POA: Diagnosis not present

## 2018-11-15 HISTORY — PX: I & D EXTREMITY: SHX5045

## 2018-11-15 LAB — BASIC METABOLIC PANEL
Anion gap: 10 (ref 5–15)
BUN: 15 mg/dL (ref 8–23)
CO2: 29 mmol/L (ref 22–32)
Calcium: 10.7 mg/dL — ABNORMAL HIGH (ref 8.9–10.3)
Chloride: 97 mmol/L — ABNORMAL LOW (ref 98–111)
Creatinine, Ser: 1.28 mg/dL — ABNORMAL HIGH (ref 0.44–1.00)
GFR calc Af Amer: 44 mL/min — ABNORMAL LOW (ref >60)
GFR calc non Af Amer: 38 mL/min — ABNORMAL LOW (ref >60)
Glucose, Bld: 106 mg/dL — ABNORMAL HIGH (ref 70–99)
Potassium: 3.8 mmol/L (ref 3.5–5.1)
Sodium: 136 mmol/L (ref 135–145)

## 2018-11-15 LAB — GLUCOSE, CAPILLARY
Glucose-Capillary: 100 mg/dL — ABNORMAL HIGH (ref 70–99)
Glucose-Capillary: 105 mg/dL — ABNORMAL HIGH (ref 70–99)
Glucose-Capillary: 203 mg/dL — ABNORMAL HIGH (ref 70–99)

## 2018-11-15 LAB — PROTIME-INR
INR: 1.1 (ref 0.8–1.2)
Prothrombin Time: 14.2 seconds (ref 11.4–15.2)

## 2018-11-15 SURGERY — IRRIGATION AND DEBRIDEMENT EXTREMITY
Anesthesia: General | Laterality: Left

## 2018-11-15 MED ORDER — PREGABALIN 25 MG PO CAPS
50.0000 mg | ORAL_CAPSULE | Freq: Every day | ORAL | Status: DC
  Administered 2018-11-15 – 2018-11-16 (×2): 50 mg via ORAL
  Filled 2018-11-15 (×2): qty 2

## 2018-11-15 MED ORDER — CEFAZOLIN SODIUM-DEXTROSE 1-4 GM/50ML-% IV SOLN: 1.0000 g | Freq: Once | INTRAVENOUS | Status: DC

## 2018-11-15 MED ORDER — CEFAZOLIN SODIUM-DEXTROSE 2-4 GM/100ML-% IV SOLN
2.0000 g | INTRAVENOUS | Status: AC
  Administered 2018-11-15: 2 g via INTRAVENOUS

## 2018-11-15 MED ORDER — POLYETHYLENE GLYCOL 3350 17 G PO PACK: 17.0000 g | PACK | Freq: Every day | ORAL | Status: DC | PRN

## 2018-11-15 MED ORDER — METHOCARBAMOL 500 MG PO TABS
500.0000 mg | ORAL_TABLET | Freq: Four times a day (QID) | ORAL | Status: DC | PRN
  Administered 2018-11-15: 500 mg via ORAL

## 2018-11-15 MED ORDER — SORBITOL 70 % SOLN: 30.0000 mL | Freq: Every day | Status: DC | PRN

## 2018-11-15 MED ORDER — PROPOFOL 10 MG/ML IV BOLUS
INTRAVENOUS | Status: DC | PRN
  Administered 2018-11-15: 20 mg via INTRAVENOUS
  Administered 2018-11-15: 70 mg via INTRAVENOUS

## 2018-11-15 MED ORDER — LIDOCAINE 2% (20 MG/ML) 5 ML SYRINGE
INTRAMUSCULAR | Status: AC
  Filled 2018-11-15: qty 15

## 2018-11-15 MED ORDER — METOCLOPRAMIDE HCL 5 MG PO TABS: 5.0000 mg | ORAL_TABLET | Freq: Three times a day (TID) | ORAL | Status: DC | PRN

## 2018-11-15 MED ORDER — HYDROCODONE-ACETAMINOPHEN 7.5-325 MG PO TABS: 1.0000 | ORAL_TABLET | ORAL | Status: DC | PRN

## 2018-11-15 MED ORDER — HYDROCODONE-ACETAMINOPHEN 5-325 MG PO TABS
1.0000 | ORAL_TABLET | ORAL | Status: DC | PRN
  Administered 2018-11-15: 2 via ORAL

## 2018-11-15 MED ORDER — CEPHALEXIN 500 MG PO CAPS
500.0000 mg | ORAL_CAPSULE | Freq: Two times a day (BID) | ORAL | Status: DC
  Administered 2018-11-15 – 2018-11-17 (×4): 500 mg via ORAL
  Filled 2018-11-15 (×4): qty 1

## 2018-11-15 MED ORDER — DOCUSATE SODIUM 100 MG PO CAPS
100.0000 mg | ORAL_CAPSULE | Freq: Two times a day (BID) | ORAL | Status: DC
  Administered 2018-11-15 – 2018-11-17 (×4): 100 mg via ORAL
  Filled 2018-11-15 (×4): qty 1

## 2018-11-15 MED ORDER — FENTANYL CITRATE (PF) 100 MCG/2ML IJ SOLN: 25.0000 ug | INTRAMUSCULAR | Status: DC | PRN

## 2018-11-15 MED ORDER — ACETAMINOPHEN 325 MG PO TABS
325.0000 mg | ORAL_TABLET | Freq: Four times a day (QID) | ORAL | Status: DC | PRN
  Administered 2018-11-16: 650 mg via ORAL
  Filled 2018-11-15: qty 2

## 2018-11-15 MED ORDER — INSULIN ASPART 100 UNIT/ML ~~LOC~~ SOLN
0.0000 [IU] | Freq: Every day | SUBCUTANEOUS | Status: DC
  Administered 2018-11-15: 2 [IU] via SUBCUTANEOUS

## 2018-11-15 MED ORDER — SODIUM CHLORIDE 0.9 % IV SOLN
INTRAVENOUS | Status: DC
  Administered 2018-11-15: 19:00:00 via INTRAVENOUS

## 2018-11-15 MED ORDER — CLONAZEPAM 0.5 MG PO TABS
0.5000 mg | ORAL_TABLET | Freq: Every day | ORAL | Status: DC
  Administered 2018-11-15 – 2018-11-16 (×2): 0.5 mg via ORAL
  Filled 2018-11-15 (×2): qty 1

## 2018-11-15 MED ORDER — ONDANSETRON HCL 4 MG PO TABS: 4.0000 mg | ORAL_TABLET | Freq: Four times a day (QID) | ORAL | Status: DC | PRN

## 2018-11-15 MED ORDER — ONDANSETRON HCL 4 MG/2ML IJ SOLN: 4.0000 mg | Freq: Four times a day (QID) | INTRAMUSCULAR | Status: DC | PRN

## 2018-11-15 MED ORDER — RIVAROXABAN 15 MG PO TABS
15.0000 mg | ORAL_TABLET | Freq: Every day | ORAL | Status: DC
  Administered 2018-11-15 – 2018-11-16 (×2): 15 mg via ORAL
  Filled 2018-11-15 (×3): qty 1

## 2018-11-15 MED ORDER — HYDROCHLOROTHIAZIDE 12.5 MG PO TABS
6.2500 mg | ORAL_TABLET | Freq: Every day | ORAL | Status: DC
  Filled 2018-11-15: qty 1

## 2018-11-15 MED ORDER — LOSARTAN POTASSIUM 50 MG PO TABS
100.0000 mg | ORAL_TABLET | Freq: Every day | ORAL | Status: DC
  Administered 2018-11-15 – 2018-11-17 (×3): 100 mg via ORAL
  Filled 2018-11-15 (×3): qty 2

## 2018-11-15 MED ORDER — DIPHENHYDRAMINE HCL 12.5 MG/5ML PO ELIX: 25.0000 mg | ORAL_SOLUTION | ORAL | Status: DC | PRN

## 2018-11-15 MED ORDER — VANCOMYCIN HCL 1000 MG IV SOLR
INTRAVENOUS | Status: AC
  Filled 2018-11-15: qty 1000

## 2018-11-15 MED ORDER — ADULT MULTIVITAMIN W/MINERALS CH
1.0000 | ORAL_TABLET | Freq: Every day | ORAL | Status: DC
  Administered 2018-11-15 – 2018-11-17 (×3): 1 via ORAL
  Filled 2018-11-15 (×3): qty 1

## 2018-11-15 MED ORDER — INSULIN ASPART 100 UNIT/ML ~~LOC~~ SOLN
0.0000 [IU] | Freq: Three times a day (TID) | SUBCUTANEOUS | Status: DC
  Administered 2018-11-16: 3 [IU] via SUBCUTANEOUS

## 2018-11-15 MED ORDER — METHOCARBAMOL 1000 MG/10ML IJ SOLN
500.0000 mg | Freq: Four times a day (QID) | INTRAVENOUS | Status: DC | PRN
  Filled 2018-11-15: qty 5

## 2018-11-15 MED ORDER — LACTATED RINGERS IV SOLN
INTRAVENOUS | Status: DC
  Administered 2018-11-15: 15:00:00 via INTRAVENOUS

## 2018-11-15 MED ORDER — CHLORHEXIDINE GLUCONATE 4 % EX LIQD: 60.0000 mL | Freq: Once | CUTANEOUS | Status: DC

## 2018-11-15 MED ORDER — HYDROCHLOROTHIAZIDE 10 MG/ML ORAL SUSPENSION
6.2500 mg | Freq: Every day | ORAL | Status: DC
  Administered 2018-11-15 – 2018-11-17 (×3): 6.25 mg via ORAL
  Filled 2018-11-15 (×4): qty 1.25

## 2018-11-15 MED ORDER — FENTANYL CITRATE (PF) 250 MCG/5ML IJ SOLN
INTRAMUSCULAR | Status: DC | PRN
  Administered 2018-11-15: 25 ug via INTRAVENOUS

## 2018-11-15 MED ORDER — ESCITALOPRAM OXALATE 10 MG PO TABS
20.0000 mg | ORAL_TABLET | Freq: Every day | ORAL | Status: DC
  Administered 2018-11-16 – 2018-11-17 (×2): 20 mg via ORAL
  Filled 2018-11-15 (×2): qty 2

## 2018-11-15 MED ORDER — METHOCARBAMOL 500 MG PO TABS
ORAL_TABLET | ORAL | Status: AC
  Filled 2018-11-15: qty 1

## 2018-11-15 MED ORDER — FENTANYL CITRATE (PF) 100 MCG/2ML IJ SOLN
INTRAMUSCULAR | Status: AC
  Filled 2018-11-15: qty 2

## 2018-11-15 MED ORDER — CEFAZOLIN SODIUM-DEXTROSE 2-4 GM/100ML-% IV SOLN
2.0000 g | Freq: Four times a day (QID) | INTRAVENOUS | Status: AC
  Administered 2018-11-15 – 2018-11-16 (×3): 2 g via INTRAVENOUS
  Filled 2018-11-15 (×3): qty 100

## 2018-11-15 MED ORDER — 0.9 % SODIUM CHLORIDE (POUR BTL) OPTIME
TOPICAL | Status: DC | PRN
  Administered 2018-11-15: 1000 mL

## 2018-11-15 MED ORDER — INSULIN GLARGINE 100 UNIT/ML ~~LOC~~ SOLN
4.0000 [IU] | Freq: Every day | SUBCUTANEOUS | Status: DC
  Administered 2018-11-15 – 2018-11-17 (×3): 4 [IU] via SUBCUTANEOUS
  Filled 2018-11-15 (×3): qty 0.04

## 2018-11-15 MED ORDER — MORPHINE SULFATE (PF) 2 MG/ML IV SOLN: 0.5000 mg | INTRAVENOUS | Status: DC | PRN

## 2018-11-15 MED ORDER — GLYCOPYRROLATE PF 0.2 MG/ML IJ SOSY
PREFILLED_SYRINGE | INTRAMUSCULAR | Status: AC
  Filled 2018-11-15: qty 2

## 2018-11-15 MED ORDER — MIRTAZAPINE 15 MG PO TABS
15.0000 mg | ORAL_TABLET | Freq: Every day | ORAL | Status: DC
  Administered 2018-11-15 – 2018-11-16 (×2): 15 mg via ORAL
  Filled 2018-11-15 (×2): qty 1

## 2018-11-15 MED ORDER — SODIUM FLUORIDE 1.1 % DT GEL: 1.0000 "application " | DENTAL | Status: DC

## 2018-11-15 MED ORDER — ACETAMINOPHEN 500 MG PO TABS
500.0000 mg | ORAL_TABLET | Freq: Four times a day (QID) | ORAL | Status: AC
  Administered 2018-11-15 – 2018-11-16 (×4): 500 mg via ORAL
  Filled 2018-11-15 (×4): qty 1

## 2018-11-15 MED ORDER — LIDOCAINE 2% (20 MG/ML) 5 ML SYRINGE
INTRAMUSCULAR | Status: DC | PRN
  Administered 2018-11-15: 40 mg via INTRAVENOUS

## 2018-11-15 MED ORDER — MELATONIN 3 MG PO TABS
6.0000 mg | ORAL_TABLET | Freq: Every day | ORAL | Status: DC
  Administered 2018-11-15 – 2018-11-16 (×2): 6 mg via ORAL
  Filled 2018-11-15 (×3): qty 2

## 2018-11-15 MED ORDER — MAGNESIUM CITRATE PO SOLN: 1.0000 | Freq: Once | ORAL | Status: DC | PRN

## 2018-11-15 MED ORDER — HYDROCODONE-ACETAMINOPHEN 5-325 MG PO TABS
ORAL_TABLET | ORAL | Status: AC
  Filled 2018-11-15: qty 2

## 2018-11-15 MED ORDER — METOCLOPRAMIDE HCL 5 MG/ML IJ SOLN: 5.0000 mg | Freq: Three times a day (TID) | INTRAMUSCULAR | Status: DC | PRN

## 2018-11-15 MED ORDER — SODIUM CHLORIDE 0.9 % IR SOLN
Status: DC | PRN
  Administered 2018-11-15: 3000 mL

## 2018-11-15 MED ORDER — ONDANSETRON HCL 4 MG/2ML IJ SOLN: 4.0000 mg | Freq: Once | INTRAMUSCULAR | Status: DC | PRN

## 2018-11-15 MED ORDER — ONDANSETRON HCL 4 MG/2ML IJ SOLN
INTRAMUSCULAR | Status: AC
  Filled 2018-11-15: qty 4

## 2018-11-15 MED ORDER — CEFAZOLIN SODIUM-DEXTROSE 2-4 GM/100ML-% IV SOLN
INTRAVENOUS | Status: AC
  Filled 2018-11-15: qty 100

## 2018-11-15 SURGICAL SUPPLY — 51 items
BANDAGE ACE 3X5.8 VEL STRL LF (GAUZE/BANDAGES/DRESSINGS) ×2 IMPLANT
BNDG COHESIVE 4X5 TAN STRL (GAUZE/BANDAGES/DRESSINGS) ×2 IMPLANT
BNDG COHESIVE 6X5 TAN STRL LF (GAUZE/BANDAGES/DRESSINGS) ×4 IMPLANT
BNDG GAUZE STRTCH 6 (GAUZE/BANDAGES/DRESSINGS) ×2 IMPLANT
CANISTER WOUND CARE 500ML ATS (WOUND CARE) ×2 IMPLANT
COVER SURGICAL LIGHT HANDLE (MISCELLANEOUS) ×2 IMPLANT
COVER WAND RF STERILE (DRAPES) ×2 IMPLANT
CUFF TOURN SGL QUICK 24 (TOURNIQUET CUFF)
CUFF TOURN SGL QUICK 34 (TOURNIQUET CUFF)
CUFF TOURNIQUET SINGLE 44IN (TOURNIQUET CUFF) IMPLANT
CUFF TRNQT CYL 24X4X16.5-23 (TOURNIQUET CUFF) IMPLANT
CUFF TRNQT CYL 34X4.125X (TOURNIQUET CUFF) IMPLANT
DRAPE U-SHAPE 47X51 STRL (DRAPES) ×2 IMPLANT
DRSG ADAPTIC 3X8 NADH LF (GAUZE/BANDAGES/DRESSINGS) ×2 IMPLANT
DRSG VAC ATS SM SENSATRAC (GAUZE/BANDAGES/DRESSINGS) ×2 IMPLANT
DURAPREP 26ML APPLICATOR (WOUND CARE) ×2 IMPLANT
ELECT REM PT RETURN 9FT ADLT (ELECTROSURGICAL)
ELECTRODE REM PT RTRN 9FT ADLT (ELECTROSURGICAL) IMPLANT
GAUZE SPONGE 4X4 12PLY STRL (GAUZE/BANDAGES/DRESSINGS) ×4 IMPLANT
GAUZE XEROFORM 5X9 LF (GAUZE/BANDAGES/DRESSINGS) ×2 IMPLANT
GLOVE BIOGEL PI IND STRL 7.0 (GLOVE) ×1 IMPLANT
GLOVE BIOGEL PI INDICATOR 7.0 (GLOVE) ×1
GLOVE ECLIPSE 7.0 STRL STRAW (GLOVE) ×2 IMPLANT
GLOVE SKINSENSE NS SZ7.5 (GLOVE) ×2
GLOVE SKINSENSE STRL SZ7.5 (GLOVE) ×2 IMPLANT
GOWN STRL REIN XL XLG (GOWN DISPOSABLE) ×4 IMPLANT
HANDPIECE INTERPULSE COAX TIP (DISPOSABLE)
KIT BASIN OR (CUSTOM PROCEDURE TRAY) ×2 IMPLANT
KIT TURNOVER KIT B (KITS) ×2 IMPLANT
MANIFOLD NEPTUNE II (INSTRUMENTS) ×2 IMPLANT
PACK ORTHO EXTREMITY (CUSTOM PROCEDURE TRAY) ×2 IMPLANT
PAD ABD 8X10 STRL (GAUZE/BANDAGES/DRESSINGS) ×2 IMPLANT
PAD ARMBOARD 7.5X6 YLW CONV (MISCELLANEOUS) ×4 IMPLANT
PADDING CAST ABS 4INX4YD NS (CAST SUPPLIES) ×1
PADDING CAST ABS COTTON 4X4 ST (CAST SUPPLIES) ×1 IMPLANT
PADDING CAST COTTON 6X4 STRL (CAST SUPPLIES) ×2 IMPLANT
SET HNDPC FAN SPRY TIP SCT (DISPOSABLE) IMPLANT
SPONGE LAP 18X18 RF (DISPOSABLE) ×2 IMPLANT
STOCKINETTE IMPERVIOUS 9X36 MD (GAUZE/BANDAGES/DRESSINGS) ×2 IMPLANT
SUT ETHILON 2 0 FS 18 (SUTURE) ×2 IMPLANT
SUT ETHILON 2 0 PSLX (SUTURE) IMPLANT
SUT ETHILON 3 0 PS 1 (SUTURE) ×4 IMPLANT
SUT VIC AB 2-0 FS1 27 (SUTURE) ×4 IMPLANT
SWAB CULTURE ESWAB REG 1ML (MISCELLANEOUS) IMPLANT
TOWEL GREEN STERILE FF (TOWEL DISPOSABLE) ×2 IMPLANT
TOWEL OR 17X26 10 PK STRL BLUE (TOWEL DISPOSABLE) ×2 IMPLANT
TUBE CONNECTING 12X1/4 (SUCTIONS) ×2 IMPLANT
TUBING TUR DISP (UROLOGICAL SUPPLIES) ×2 IMPLANT
UNDERPAD 30X30 (UNDERPADS AND DIAPERS) ×4 IMPLANT
WATER STERILE IRR 1000ML POUR (IV SOLUTION) ×2 IMPLANT
YANKAUER SUCT BULB TIP NO VENT (SUCTIONS) ×2 IMPLANT

## 2018-11-15 NOTE — Progress Notes (Signed)
Late entry. Pacemaker reprogrammed to asynchronous pacing by [employer] representative per cardiac device orders.

## 2018-11-15 NOTE — Transfer of Care (Signed)
Immediate Anesthesia Transfer of Care Note  Patient: [name]  Procedure(s) Performed: IRRIGATION AND DEBRIDEMENT LEFT ELBOW, WOUND VAC (Left )  Patient Location: PACU  Anesthesia Type:General  Level of Consciousness: awake, alert , oriented and patient cooperative  Airway & Oxygen Therapy: Patient Spontanous Breathing and Patient connected to face mask oxygen  Post-op Assessment: Report given to RN and Post -op Vital signs reviewed and stable  Post vital signs: Reviewed and stable  Last Vitals:  Vitals Value Taken Time  BP 118/93 11/15/18 1636  Temp    Pulse 79 11/15/18 1638  Resp 19 11/15/18 1638  SpO2 98 % 11/15/18 1638  Vitals shown include unvalidated device data.  Last Pain:  Vitals:   11/15/18 1420  TempSrc:   PainSc: 0-No pain      Patients Stated Pain Goal: 4 (21/62/44 6950)  Complications: No apparent anesthesia complications

## 2018-11-15 NOTE — Anesthesia Preprocedure Evaluation (Addendum)
Anesthesia Evaluation  Patient identified by MRN, date of birth, ID band Patient awake    Reviewed: Allergy & Precautions, NPO status , Patient's Chart, lab work & pertinent test results  Airway Mallampati: I  TM Distance: >3 FB Neck ROM: Full    Dental  (+) Teeth Intact, Dental Advisory Given   Pulmonary COPD, Current Smoker,    breath sounds clear to auscultation       Cardiovascular hypertension, Pt. on medications + dysrhythmias Atrial Fibrillation + pacemaker  Rhythm:Regular Rate:Normal     Neuro/Psych  Headaches, Anxiety Depression TIA   GI/Hepatic negative GI ROS, Neg liver ROS,   Endo/Other  diabetes, Type 2, Insulin Dependent  Renal/GU Renal InsufficiencyRenal disease     Musculoskeletal  (+) Arthritis ,   Abdominal Normal abdominal exam  (+)   Peds  Hematology negative hematology ROS (+)   Anesthesia Other Findings   Reproductive/Obstetrics                            Lab Results  Component Value Date   WBC 6.2 11/04/2018   HGB 12.5 11/04/2018   HCT 39.3 11/04/2018   MCV 96.6 11/04/2018   PLT 264 11/04/2018   Lab Results  Component Value Date   CREATININE 1.28 (H) 11/04/2018   BUN 14 11/04/2018   NA 134 (L) 11/04/2018   K 3.8 11/04/2018   CL 95 (L) 11/04/2018   CO2 30 11/04/2018   Lab Results  Component Value Date   INR 1.1 11/04/2018   INR 2.4 08/30/2018   INR 2.7 08/02/2018   Echo: - Left ventricle: The cavity size was normal. Systolic function was   normal. The estimated ejection fraction was in the range of 60%   to 65%. Wall motion was normal; there were no regional wall   motion abnormalities. The study is not technically sufficient to   allow evaluation of LV diastolic function. - Aortic valve: Transvalvular velocity was within the normal range.   There was no stenosis. There was no regurgitation. - Mitral valve: Calcified annulus. Transvalvular velocity  was   within the normal range. There was no evidence for stenosis.   There was mild regurgitation. - Left atrium: The atrium was mildly dilated. - Right ventricle: The cavity size was normal. Wall thickness was   normal. Systolic function was normal. - Right atrium: The atrium was severely dilated. - Atrial septum: No defect or patent foramen ovale was identified. - Tricuspid valve: There was mild regurgitation. - Pulmonary arteries: Systolic pressure was within the normal   range. PA peak pressure: 25 mm Hg (S).  Anesthesia Physical Anesthesia Plan  ASA: III  Anesthesia Plan: General   Post-op Pain Management:    Induction: Intravenous  PONV Risk Score and Plan: 3 and Ondansetron and Treatment may vary due to age or medical condition  Airway Management Planned: LMA  Additional Equipment: None  Intra-op Plan:   Post-operative Plan: Extubation in OR  Informed Consent: I have reviewed the patients History and Physical, chart, labs and discussed the procedure including the risks, benefits and alternatives for the proposed anesthesia with the patient or authorized representative who has indicated his/her understanding and acceptance.     Dental advisory given  Plan Discussed with: CRNA  Anesthesia Plan Comments: (Magnet in room)       Anesthesia Quick Evaluation

## 2018-11-15 NOTE — Op Note (Signed)
   Date of Surgery: 11/15/2018  INDICATIONS: Ms. Shor is a 85 y.o.-year-old female with a left elbow wound dehiscence with exposed hardware;  The patient did consent to the procedure after discussion of the risks and benefits.  PREOPERATIVE DIAGNOSIS: Left posterior elbow surgical wound dehiscence with exposed hardware status post ORIF  POSTOPERATIVE DIAGNOSIS: Same.  PROCEDURE:  1.  Excisional debridement of skin, subcutaneous tissue, tendon, bone 60 cm; CPT 11044, 11047 2.  Removal of left olecranon plate and screws; CPT 20680 3.  Secondary closure of surgical wound dehiscence of left elbow; CPT 13160 4.  Application of wound VAC less than 50 cm; CPT 97605  SURGEON: N. Eduard Roux, M.D.  ASSIST: Ciro Backer [place], [place]; necessary for the timely completion of procedure and due to complexity of procedure.  ANESTHESIA:  general  IV FLUIDS AND URINE: See anesthesia.  ESTIMATED BLOOD LOSS: minimal mL.  IMPLANTS: none  DRAINS: none  COMPLICATIONS: see description of procedure.  DESCRIPTION OF PROCEDURE: The patient was brought to the operating room and placed supine on the operating table.  The patient had been signed prior to the procedure and this was documented. The patient had the anesthesia placed by the anesthesiologist.  A time-out was performed to confirm that this was the correct patient, site, side and location. The patient did receive antibiotics prior to the incision and was re-dosed during the procedure as needed at indicated intervals.  A tourniquet was placed.  The patient had the operative extremity prepped and draped in the standard surgical fashion.    I first began with sharp excisional debridement of the surgical wound dehiscence by using a 15 blade.  This was done in in a full-thickness fashion down to the bone and plate.  The nonviable tissue was all removed using a knife and rongeur.  The tissue was elevated off of the hardware.  There is no evidence of gross  infection.  The plate was then removed without difficulty by backing out of the screws sequentially by hand and elevating the plate off of the bone.  I examined all around the fracture to see how much healing there was an I cannot find any evidence of destabilization or fracture lines after plate removal.  I continued my sharp excisional debridement of the bone at that point using a rongeur.  After this was done I then excised more devitalized tissue proximally.  Some of the triceps attachment had to be debrided.  After thorough debridement this was then thoroughly irrigated with normal saline using pulse lavage.  I then performed secondary closure of the wound using 3-0 nylon in a vertical mattress fashion in order to approximate the skin edges.  I was able to do this for the distal two thirds of the wound.  However over the tip of the olecranon and working proximally there was too much skin loss in order to approximate therefore I placed a wound VAC over this area as well as over the incision.  A wound VAC was turned to -100 mmHg which provided excellent seal and suction.  Sterile dressings were applied.  Patient tolerated the procedure well had no any complications.  POSTOPERATIVE PLAN: Patient will be admitted.  She will need a home VAC unit.  She will need home health nursing.  Azucena Cecil, MD [place] 4:50 PM

## 2018-11-15 NOTE — Anesthesia Postprocedure Evaluation (Signed)
Anesthesia Post Note  Patient: [name]  Procedure(s) Performed: IRRIGATION AND DEBRIDEMENT LEFT ELBOW, WOUND VAC (Left )     Patient location during evaluation: PACU Anesthesia Type: General Level of consciousness: awake and alert Pain management: pain level controlled Vital Signs Assessment: post-procedure vital signs reviewed and stable Respiratory status: spontaneous breathing, nonlabored ventilation, respiratory function stable and patient connected to nasal cannula oxygen Cardiovascular status: blood pressure returned to baseline and stable Postop Assessment: no apparent nausea or vomiting Anesthetic complications: no    Last Vitals:  Vitals:   11/15/18 1750 11/15/18 1800  BP: (!) 182/85   Pulse: 80   Resp: 14   Temp:    SpO2: 92% 92%    Last Pain:  Vitals:   11/15/18 1706  TempSrc:   PainSc: Asleep                 Effie Berkshire

## 2018-11-15 NOTE — Telephone Encounter (Signed)
Pt tried to transmit but received the error code [id number]. I told her to let the handle charge for an hour and try again.

## 2018-11-15 NOTE — H&P (Signed)
PREOPERATIVE H&P  Chief Complaint: left elbow wound dehiscence  HPI: [name] is a 85 y.o. female who presents for surgical treatment of left elbow wound dehiscence.  She denies any changes in medical history.  Past Medical History:  Diagnosis Date  . Ambulates with cane    straight  . Anxiety   . Arthritis    lower back  . CKD (chronic kidney disease), stage III ([place])   . Closed fracture of left olecranon process   . COPD (chronic obstructive pulmonary disease) ([place])   . Depression   . Diabetes mellitus without complication ([place])    type 2  . Headache   . Hypertension   . Orthostatic hypotension   . Osteopenia   . Pacemaker   . 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 Permanent atrial fibrillation    a.  s/p AVN ablation 2010 with complete heart block s/p PPM (gen change 2015, pt preference for Coumadin).  . Protein calorie malnutrition ([place])   . S/P AV nodal ablation   . TIA (transient ischemic attack)    Past Surgical History:  Procedure Laterality Date  . ABLATION  2012   AVN ablation by Dr Lovena Le  . cateracts Bilateral    eyes  . CYST REMOVAL TRUNK     from breast  . I&D EXTREMITY Left 11/04/2018   Procedure: IRRIGATION AND DEBRIDEMENT LEFT ELBOW, WOUND VAC;  Surgeon: Leandrew Koyanagi, MD;  Location: [facility];  Service: Orthopedics;  Laterality: Left;  . ORIF ELBOW FRACTURE Left 10/07/2018   Procedure: OPEN REDUCTION INTERNAL FIXATION (ORIF) LEFT OLECRANON FRACTURE;  Surgeon: Leandrew Koyanagi, MD;  Location: [facility];  Service: Orthopedics;  Laterality: Left;  . PACEMAKER INSERTION    . PERMANENT PACEMAKER GENERATOR CHANGE N/A 07/18/2013   Procedure: PERMANENT PACEMAKER GENERATOR CHANGE;  Surgeon: Deboraha Sprang, MD;  Location: [facility] CATH LAB;  Service: Cardiovascular;  Laterality: N/A;  . WISDOM TOOTH EXTRACTION     Social History   Socioeconomic History  . Marital status: Married    Spouse name: [name]  .  Number of children: 0  . Years of education: 32  . 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 Financial resource strain: Not on file  . Food insecurity    Worry: Not on file    Inability: Not on file  . Transportation needs    Medical: Not on file    Non-medical: Not on file  Tobacco Use  . Smoking status: Current Every Day Smoker    Packs/day: 0.50    Years: 67.00    Pack years: 33.50    Types: Cigarettes  . Smokeless tobacco: Never Used  Substance and Sexual Activity  . Alcohol use: No    Alcohol/week: 0.0 standard drinks  . Drug use: No  . Sexual activity: Not on file  Lifestyle  . Physical activity    Days per week: Not on file    Minutes per session: Not on file  . Stress: Not on file  Relationships  . Social Herbalist on phone: Not on file    Gets together: Not on file    Attends religious service: Not on file    Active member of club or organization: Not on file    Attends meetings of clubs or organizations: Not on file    Relationship  status: Not on file  Other Topics Concern  . Not on file  Social History Narrative   Lives at home with husband.  Education PHDD.  No children.     Family History  Problem Relation Age of Onset  . Stroke Father    Allergies  Allergen Reactions  . Eliquis [Apixaban] Other (See Comments)    Made pt have weak muscles  . Gabapentin Nausea Only and Other (See Comments)    SHAKES AND NAUSEA   . Rozerem [Ramelteon] Other (See Comments)    DIZZY AND HEAD THROBBING  . Tikosyn [Dofetilide] Itching and Swelling    Mouth swells, itching  . Tradjenta [Linagliptin] Diarrhea and Other (See Comments)    Joint Pain, Stomach issues  . Temazepam Other (See Comments)    INSOMNIA   Prior to Admission medications   Medication Sig Start Date End Date Taking? Authorizing Provider  calcium-vitamin D (OSCAL WITH D) 500-200 MG-UNIT tablet Take 1 tablet by mouth 3  (three) times daily. 10/07/18  Yes Leandrew Koyanagi, MD  cephALEXin (KEFLEX) 500 MG capsule Take 1 capsule (500 mg total) by mouth 4 (four) times daily. 10/28/18  Yes Leandrew Koyanagi, MD  clonazePAM (KLONOPIN) 0.5 MG tablet Take 0.5 mg by mouth at bedtime. 01/02/17  Yes [provider]  escitalopram (LEXAPRO) 20 MG tablet Take 20 mg by mouth daily.  02/10/15  Yes [provider]  hydrochlorothiazide (HYDRODIURIL) 12.5 MG tablet TAKE 0.5 TABLETS (6.25 MG TOTAL) BY MOUTH DAILY. 10/27/18  Yes [name], MD  HYDROcodone-acetaminophen (NORCO/VICODIN) 5-325 MG tablet Take 0.5 tablets by mouth 2 (two) times daily.    Yes [provider]  Insulin Glargine (TOUJEO SOLOSTAR) 300 UNIT/ML SOPN Inject 4 Units into the skin daily.    Yes [provider]  losartan (COZAAR) 100 MG tablet Take 100 mg by mouth daily.   Yes [provider]  magic mouthwash SOLN Take 5 mLs by mouth 3 (three) times daily.  03/08/18  Yes [provider]  Melatonin 5 MG TABS Take 5 mg by mouth at bedtime.    Yes [provider]  mirtazapine (REMERON) 15 MG tablet Take 15 mg by mouth at bedtime. 10/01/18  Yes [provider]  pregabalin (LYRICA) 50 MG capsule Take 1 capsule (50 mg total) by mouth daily. Patient taking differently: Take 50 mg by mouth at bedtime.  10/18/18  Yes [name], DPM  BD PEN NEEDLE NANO U/F 32G X 4 MM MISC AS DIRECTED USE WITH TOUJEO/ DX E11.49 IN VITRO 01/21/18   [provider]  Multiple Vitamin (MULTIVITAMIN) tablet Take 1 tablet by mouth daily.    [provider]  oxyCODONE-acetaminophen (PERCOCET) 5-325 MG tablet Take 1-2 tablets by mouth every 6 (six) hours as needed for severe pain. Patient not taking: Reported on 11/02/2018 10/07/18   Leandrew Koyanagi, MD  oxyCODONE-acetaminophen (PERCOCET) 5-325 MG tablet Take 1-2 tablets by mouth every 6 (six) hours as needed for severe pain. 11/04/18   Leandrew Koyanagi, MD  PREVIDENT 5000 DRY  MOUTH 1.1 % GEL dental gel Take 1 application by mouth See admin instructions. Use to brush teeth every day, replacement for regular toothpaste 08/23/18   [provider]  promethazine (PHENERGAN) 12.5 MG tablet Take 1 tablet (12.5 mg total) by mouth 3 (three) times daily as needed for nausea or vomiting. 11/02/18   Aundra Dubin, PA-C  Rivaroxaban (XARELTO) 15 MG TABS tablet Take 1 tablet (15 mg total) by  mouth daily with supper. 09/29/18   Deboraha Sprang, MD     Positive ROS: All other systems have been reviewed and were otherwise negative with the exception of those mentioned in the HPI and as above.  Physical Exam: General: Alert, no acute distress Cardiovascular: No pedal edema Respiratory: No cyanosis, no use of accessory musculature GI: abdomen soft Skin: No lesions in the area of chief complaint Neurologic: Sensation intact distally Psychiatric: Patient is competent for consent with normal mood and affect Lymphatic: no lymphedema  MUSCULOSKELETAL: exam stable  Assessment: left elbow wound dehiscence  Plan: Plan for Procedure(s): IRRIGATION AND DEBRIDEMENT LEFT ELBOW, WOUND VAC  The risks benefits and alternatives were discussed with the patient including but not limited to the risks of nonoperative treatment, versus surgical intervention including infection, bleeding, nerve injury,  blood clots, cardiopulmonary complications, morbidity, mortality, among others, and they were willing to proceed.   Eduard Roux, MD   11/15/2018 2:36 PM

## 2018-11-15 NOTE — Anesthesia Procedure Notes (Signed)
Procedure Name: Intubation Date/Time: 11/15/2018 3:35 PM Performed by: Cleda Daub, CRNA Pre-anesthesia Checklist: Patient identified, Emergency Drugs available, Suction available and Patient being monitored Patient Re-evaluated:Patient Re-evaluated prior to induction Oxygen Delivery Method: Circle system utilized Preoxygenation: Pre-oxygenation with 100% oxygen Induction Type: IV induction LMA: LMA inserted LMA Size: 4.0 Placement Confirmation: positive ETCO2 Tube secured with: Tape Dental Injury: Teeth and Oropharynx as per pre-operative assessment

## 2018-11-15 NOTE — Plan of Care (Signed)

## 2018-11-16 ENCOUNTER — Encounter (HOSPITAL_COMMUNITY): Payer: Self-pay | Admitting: Orthopaedic Surgery

## 2018-11-16 ENCOUNTER — Other Ambulatory Visit: Payer: Self-pay | Admitting: Physician Assistant

## 2018-11-16 LAB — GLUCOSE, CAPILLARY
Glucose-Capillary: 78 mg/dL (ref 70–99)
Glucose-Capillary: 159 mg/dL — ABNORMAL HIGH (ref 70–99)
Glucose-Capillary: 118 mg/dL — ABNORMAL HIGH (ref 70–99)
Glucose-Capillary: 48 mg/dL — ABNORMAL LOW (ref 70–99)
Glucose-Capillary: 190 mg/dL — ABNORMAL HIGH (ref 70–99)
Glucose-Capillary: 96 mg/dL (ref 70–99)

## 2018-11-16 MED ORDER — CEPHALEXIN 500 MG PO CAPS: ORAL_CAPSULE | ORAL | 0 refills | Status: DC

## 2018-11-16 MED ORDER — CEPHALEXIN 250 MG PO CAPS: 250.0000 mg | ORAL_CAPSULE | Freq: Two times a day (BID) | ORAL | 1 refills | Status: DC

## 2018-11-16 MED ORDER — HYDROCODONE-ACETAMINOPHEN 5-325 MG PO TABS: ORAL_TABLET | ORAL | 0 refills | Status: DC

## 2018-11-16 NOTE — Discharge Summary (Addendum)
Patient ID: [name] MRN: [id number] DOB/AGE: 1929-01-03 85 y.o.  Admit date: 11/15/2018 Discharge date: 11/17/2018  Admission Diagnoses:  Active Problems:   History of open reduction and internal fixation (ORIF) procedure   Discharge Diagnoses:  Same  Past Medical History:  Diagnosis Date  . Ambulates with cane    straight  . Anxiety   . Arthritis    lower back  . CKD (chronic kidney disease), stage III ([place])   . Closed fracture of left olecranon process   . COPD (chronic obstructive pulmonary disease) ([place])   . Depression   . Diabetes mellitus without complication ([place])    type 2  . Headache   . Hypertension   . Orthostatic hypotension   . Osteopenia   . Pacemaker   . 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 Permanent atrial fibrillation    a.  s/p AVN ablation 2010 with complete heart block s/p PPM (gen change 2015, pt preference for Coumadin).  . Protein calorie malnutrition ([place])   . S/P AV nodal ablation   . TIA (transient ischemic attack)     Surgeries: Procedure(s): IRRIGATION AND DEBRIDEMENT LEFT ELBOW, WOUND VAC on 11/15/2018   Consultants:   Discharged Condition: Improved  Hospital Course: [name] is an 85 y.o. female who was admitted 11/15/2018 for operative treatment of<principal problem not specified>. Patient has severe unremitting pain that affects sleep, daily activities, and work/hobbies. After pre-op clearance the patient was taken to the operating room on 11/15/2018 and underwent  Procedure(s): IRRIGATION AND DEBRIDEMENT LEFT ELBOW, WOUND VAC.    Patient was given perioperative antibiotics:  Anti-infectives (From admission, onward)   Start     Dose/Rate Route Frequency Ordered Stop   11/16/18 0600  ceFAZolin (ANCEF) IVPB 2g/100 mL premix     2 g 200 mL/hr over 30 Minutes Intravenous On call to O.R. 11/15/18 1352 11/15/18 1545   11/16/18 0000  cephALEXin (KEFLEX) 500 MG capsule  Status:   Discontinued        11/16/18 1437 11/16/18    11/16/18 0000  cephALEXin (KEFLEX) 250 MG capsule     250 mg Oral 2 times daily 11/16/18 1450     11/15/18 2200  cephALEXin (KEFLEX) capsule 500 mg     500 mg Oral Every 12 hours 11/15/18 1837     11/15/18 1845  ceFAZolin (ANCEF) IVPB 2g/100 mL premix     2 g 200 mL/hr over 30 Minutes Intravenous Every 6 hours 11/15/18 1837 11/16/18 0637   11/15/18 1403  ceFAZolin (ANCEF) 2-4 GM/100ML-% IVPB    Note to Pharmacy: Henrine Screws   : cabinet override      11/15/18 1403 11/15/18 1540   11/15/18 1400  ceFAZolin (ANCEF) IVPB 1 g/50 mL premix  Status:  Discontinued     1 g 100 mL/hr over 30 Minutes Intravenous  Once 11/15/18 1350 11/15/18 1352       Patient was given sequential compression devices, early ambulation, and chemoprophylaxis to prevent DVT.  Patient benefited maximally from hospital stay and there were no complications.    Recent vital signs:  Patient Vitals for the past 24 hrs:  BP Temp Temp src Pulse Resp SpO2  11/17/18 0500 140/78 98.9 F (37.2 C) Oral 88 18 94 %  11/16/18 2026 (!) 162/73 99.4 F (37.4 C) Oral 87 20 95 %  11/16/18 1835 125/84 98.2 F (36.8 C) Oral 80 16 95 %  11/16/18 1515 (!) 132/58 98.3  F (36.8 C) Oral 79 16 94 %  11/16/18 0815 (!) 121/59 98 F (36.7 C) Oral 62 16 93 %     Recent laboratory studies:  Recent Labs    11/15/18 1448  NA 136  K 3.8  CL 97*  CO2 29  BUN 15  CREATININE 1.28*  GLUCOSE 106*  INR 1.1  CALCIUM 10.7*     Discharge Medications:   Allergies as of 11/17/2018      Reactions   Eliquis [apixaban] Other (See Comments)   Made pt have weak muscles   Gabapentin Nausea Only, Other (See Comments)   SHAKES AND NAUSEA   Rozerem [ramelteon] Other (See Comments)   DIZZY AND HEAD THROBBING   Tikosyn [dofetilide] Itching, Swelling   Mouth swells, itching   Tradjenta [linagliptin] Diarrhea, Other (See Comments)   Joint Pain, Stomach issues   Temazepam Other (See Comments)    INSOMNIA      Medication List    STOP taking these medications   oxyCODONE-acetaminophen 5-325 MG tablet Commonly known as: Percocet     TAKE these medications   BD Pen Needle Nano U/F 32G X 4 MM Misc Generic drug: Insulin Pen Needle AS DIRECTED USE WITH TOUJEO/ DX E11.49 IN VITRO   calcium-vitamin D 500-200 MG-UNIT tablet Commonly known as: OSCAL WITH D Take 1 tablet by mouth 3 (three) times daily.   cephALEXin 250 MG capsule Commonly known as: Keflex Take 1 capsule (250 mg total) by mouth 2 (two) times daily. What changed:   medication strength  how much to take  when to take this   clonazePAM 0.5 MG tablet Commonly known as: KLONOPIN Take 0.5 mg by mouth at bedtime.   escitalopram 20 MG tablet Commonly known as: LEXAPRO Take 20 mg by mouth daily.   hydrochlorothiazide 12.5 MG tablet Commonly known as: HYDRODIURIL TAKE 0.5 TABLETS (6.25 MG TOTAL) BY MOUTH DAILY.   HYDROcodone-acetaminophen 5-325 MG tablet Commonly known as: Norco Take 1/2-1 tab po bid prn pain What changed:   how much to take  how to take this  when to take this  additional instructions   losartan 100 MG tablet Commonly known as: COZAAR Take 100 mg by mouth daily.   magic mouthwash Soln Take 5 mLs by mouth 3 (three) times daily.   Melatonin 5 MG Tabs Take 5 mg by mouth at bedtime.   mirtazapine 15 MG tablet Commonly known as: REMERON Take 15 mg by mouth at bedtime.   multivitamin tablet Take 1 tablet by mouth daily.   pregabalin 50 MG capsule Commonly known as: Lyrica Take 1 capsule (50 mg total) by mouth daily. What changed: when to take this   PreviDent 5000 Dry Mouth 1.1 % Gel dental gel Generic drug: sodium fluoride Take 1 application by mouth See admin instructions. Use to brush teeth every day, replacement for regular toothpaste   promethazine 12.5 MG tablet Commonly known as: PHENERGAN Take 1 tablet (12.5 mg total) by mouth 3 (three) times daily as needed for  nausea or vomiting.   Rivaroxaban 15 MG Tabs tablet Commonly known as: Xarelto Take 1 tablet (15 mg total) by mouth daily with supper.   Toujeo SoloStar 300 UNIT/ML Sopn Generic drug: Insulin Glargine Inject 4 Units into the skin daily.            Durable Medical Equipment  (From admission, onward)         Start     Ordered   11/16/18 0817  For home  use only DME Negative pressure wound device  Once    Question Answer Comment  Frequency of dressing change 3 times per week   Length of need 3 Months   Dressing type Foam   Amount of suction 100 mm/Hg   Pressure application Continuous pressure   Supplies 10 canisters and 15 dressings per month for duration of therapy      11/16/18 0818   11/15/18 1836  DME Walker rolling  Once    Question:  Patient needs a walker to treat with the following condition  Answer:  History of open reduction and internal fixation (ORIF) procedure   11/15/18 1837   11/15/18 1836  DME 3 n 1  Once     11/15/18 1837   11/15/18 1836  DME Bedside commode  Once    Question:  Patient needs a bedside commode to treat with the following condition  Answer:  History of open reduction and internal fixation (ORIF) procedure   11/15/18 1837          Diagnostic Studies: Xr Elbow 2 Views Left  Result Date: 11/11/2018 Stable fixation of olecranon fracture.  No complications of the hardware  Xr Elbow Complete Left (3+view)  Result Date: 10/21/2018 Stable fixation of left olecranon fracture   Disposition:      Follow-up Information    Leandrew Koyanagi, MD In 2 weeks.   Specialty: Orthopedic Surgery Why: For wound re-check Contact information: [place] [address] [phone]        [facility], [facility] up.   Specialty: [facility] Contact information: [place] [address] [phone]            Signed: Aundra Dubin 11/17/2018, 7:43 AM

## 2018-11-16 NOTE — Plan of Care (Signed)
  Problem: Health Behavior/Discharge Planning: Goal: Ability to manage health-related needs will improve Outcome: Progressing   Problem: Clinical Measurements: Goal: Ability to maintain clinical measurements within normal limits will improve 11/16/2018 0947 by Williams Che, RN Outcome: Progressing 11/15/2018 1954 by Williams Che, RN Outcome: Progressing   Problem: Activity: Goal: Risk for activity intolerance will decrease 11/16/2018 0947 by Williams Che, RN Outcome: Progressing 11/15/2018 1954 by Williams Che, RN Outcome: Progressing   Problem: Nutrition: Goal: Adequate nutrition will be maintained 11/16/2018 0947 by Williams Che, RN Outcome: Progressing 11/15/2018 1954 by Williams Che, RN Outcome: Progressing   Problem: Pain Managment: Goal: General experience of comfort will improve 11/16/2018 0947 by Williams Che, RN Outcome: Progressing 11/15/2018 1954 by Williams Che, RN Outcome: Progressing   Problem: Safety: Goal: Ability to remain free from injury will improve 11/16/2018 0947 by Williams Che, RN Outcome: Progressing 11/15/2018 1954 by Williams Che, RN Outcome: Progressing   Problem: Skin Integrity: Goal: Risk for impaired skin integrity will decrease 11/16/2018 0947 by Williams Che, RN Outcome: Progressing 11/15/2018 1954 by Williams Che, RN Outcome: Progressing

## 2018-11-16 NOTE — Evaluation (Signed)
Physical Therapy Evaluation Patient Details Name: [name] MRN: [id number] DOB: 01-24-1929 Today's Date: 11/16/2018   History of Present Illness  Patient s/p Excisional debridement of skin, subcutaneous tissue, tendon, bone at L elbow on 11/15/2018. PMH significant for CKD, COPD, DM, HTN.    Clinical Impression  Patient admitted with the above listed diagnosis. Patient reports Mod I at baseline for mobility and ADLs - lives with husband who can provide assist as needed. Patient with mild instability with OOB mobility today requiring close guarding for safety and balance. - may benefit from short HHPT at discharge to ensure safety with mobility in th home environment. PT to follow acutely to maximize safe and independent functional mobility.     Follow Up Recommendations Home health PT;Supervision for mobility/OOB    Equipment Recommendations  3in1 (PT)    Recommendations for Other Services       Precautions / Restrictions Precautions Precautions: Fall Restrictions Weight Bearing Restrictions: Yes Other Position/Activity Restrictions: assumed NWB - in sling      Mobility  Bed Mobility Overal bed mobility: Needs Assistance Bed Mobility: Supine to Sit     Supine to sit: Supervision     General bed mobility comments: increased time and effort  Transfers Overall transfer level: Needs assistance Equipment used: 1 person hand held assist Transfers: Sit to/from Stand Sit to Stand: Min assist         General transfer comment: Min A to stand at bedside - mild instability with initial standing  Ambulation/Gait Ambulation/Gait assistance: Min assist;Min guard Gait Distance (Feet): 100 Feet Assistive device: 1 person hand held assist Gait Pattern/deviations: Step-through pattern;Decreased stride length Gait velocity: decreased   General Gait Details: patient reports feeling "woozy" - nursing stating blood sugar of 78 this morning with nursing providing OJ prior to  session; mild instability  Stairs            Wheelchair Mobility    Modified Rankin (Stroke Patients Only)       Balance Overall balance assessment: Mild deficits observed, not formally tested                                           Pertinent Vitals/Pain Pain Assessment: No/denies pain    Home Living Family/patient expects to be discharged to:: Private residence Living Arrangements: Spouse/significant other Available Help at Discharge: Family;Available 24 hours/day Type of Home: House Home Access: Stairs to enter Entrance Stairs-Rails: Right Entrance Stairs-Number of Steps: 3 Home Layout: One level Home Equipment: Cane - single point      Prior Function Level of Independence: Independent         Comments: intermittent use of SPC     Hand Dominance   Dominant Hand: Right    Extremity/Trunk Assessment   Upper Extremity Assessment Upper Extremity Assessment: Defer to OT evaluation    Lower Extremity Assessment Lower Extremity Assessment: Generalized weakness    Cervical / Trunk Assessment Cervical / Trunk Assessment: Normal  Communication   Communication: No difficulties  Cognition Arousal/Alertness: Awake/alert Behavior During Therapy: WFL for tasks assessed/performed Overall Cognitive Status: Within Functional Limits for tasks assessed                                        General Comments  Exercises     Assessment/Plan    PT Assessment Patient needs continued PT services  PT Problem List Decreased strength;Decreased activity tolerance;Decreased balance;Decreased mobility;Decreased knowledge of use of DME;Decreased safety awareness       PT Treatment Interventions DME instruction;Gait training;Stair training;Functional mobility training;Therapeutic activities;Therapeutic exercise;Balance training;Patient/family education    PT Goals (Current goals can be found in the Care Plan section)  Acute  Rehab PT Goals Patient Stated Goal: return home PT Goal Formulation: With patient Time For Goal Achievement: 11/30/18 Potential to Achieve Goals: Good    Frequency Min 3X/week   Barriers to discharge        Co-evaluation               AM-PAC PT "6 Clicks" Mobility  Outcome Measure Help needed turning from your back to your side while in a flat bed without using bedrails?: A Little Help needed moving from lying on your back to sitting on the side of a flat bed without using bedrails?: A Little Help needed moving to and from a bed to a chair (including a wheelchair)?: A Little Help needed standing up from a chair using your arms (e.g., wheelchair or bedside chair)?: A Little Help needed to walk in hospital room?: A Little Help needed climbing 3-5 steps with a railing? : A Lot 6 Click Score: 17    End of Session Equipment Utilized During Treatment: Gait belt(L UE sling) Activity Tolerance: Patient tolerated treatment well Patient left: in chair;with call bell/phone within reach Nurse Communication: Mobility status PT Visit Diagnosis: Unsteadiness on feet (R26.81);Other abnormalities of gait and mobility (R26.89);Muscle weakness (generalized) (M62.81);History of falling (Z91.81)    Time: 7209-4709 PT Time Calculation (min) (ACUTE ONLY): 23 min   Charges:   PT Evaluation $PT Eval Low Complexity: 1 Low PT Treatments $Gait Training: 8-22 mins        Lanney Gins, PT, DPT Supplemental Physical Therapist 11/16/18 9:35 AM Pager: [phone] Office: [phone]

## 2018-11-16 NOTE — Care Management (Signed)
Just received [employer] application signed. Faxed to [employer]. Pending Insurance authorization.   Magdalen Spatz RN BSN [phone]

## 2018-11-16 NOTE — Progress Notes (Signed)
Subjective: 1 Day Post-Op Procedure(s) (LRB): IRRIGATION AND DEBRIDEMENT LEFT ELBOW, WOUND VAC (Left) Patient reports pain as mild.  Feeling well this am.  Has not really been elevating LUE for swelling  Objective: Vital signs in last 24 hours: Temp:  [97.2 F (36.2 C)-98.2 F (36.8 C)] 98 F (36.7 C) (06/30 0815) Pulse Rate:  [60-82] 62 (06/30 0815) Resp:  [10-22] 16 (06/30 0815) BP: (107-182)/(51-110) 121/59 (06/30 0815) SpO2:  [90 %-100 %] 93 % (06/30 0815) Weight:  [45.5 kg] 45.5 kg (06/29 1339)  Intake/Output from previous day: 06/29 0701 - 06/30 0700 In: 620 [P.O.:120; I.V.:500] Out: 2 [Blood:2] Intake/Output this shift: No intake/output data recorded.  No results for input(s): HGB in the last 72 hours. No results for input(s): WBC, RBC, HCT, PLT in the last 72 hours. Recent Labs    11/15/18 1448  NA 136  K 3.8  CL 97*  CO2 29  BUN 15  CREATININE 1.28*  GLUCOSE 106*  CALCIUM 10.7*   Recent Labs    11/15/18 1448  INR 1.1    Neurologically intact Neurovascular intact Sensation intact distally Intact pulses distally  LUE- wound vac in place.  125 cc blood in canister   Assessment/Plan: 1 Day Post-Op Procedure(s) (LRB): IRRIGATION AND DEBRIDEMENT LEFT ELBOW, WOUND VAC (Left) Advance diet Up with therapy  Continue with wound vac in hospital and once d/c home D/c home today once wound vac care has been approved        Aundra Dubin 11/16/2018, 8:18 AM

## 2018-11-16 NOTE — Progress Notes (Addendum)
Hypoglycemic Event  CBG: 16:32=48  Treatment: 2 cups orange juice, 1 pack graham crackers and 1/4 cup chocolate ice cream  Symptoms: Weakness  Follow-up CBG: Time:16:52 CBG Result:118  Comments: MD notified (Dr.Yates)    [name] Atharv Barriere

## 2018-11-16 NOTE — Progress Notes (Signed)
CSW acknowledges SNF consult, CSW notes PT rec of [facility] and DME needs. CSW will follow for discharge planning.   [place], [place]

## 2018-11-16 NOTE — TOC Initial Note (Addendum)
Transition of Care [facility]) - Initial/Assessment Note    Patient Details  Name: [name] MRN: [id number] Date of Birth: 1928-09-23  [facility]) CM/SW Contact:    Marilu Favre, RN Phone Number: 11/16/2018, 10:15 AM  Clinical Narrative:       [facility] unable to accept patient due to staffing. Butch Penny with [facility] has accepted referral.           [facility] now able to take patient  Discussed home health and [facility] with patient.   DR Erlinda Hong will sign [employer] application at lunch. Once application signed and receive wound measurements will fax to [facility]. Dr Erlinda Hong and patient aware, not sure if we can get insurance authorization this afternoon, may be tomorrow.   Once insurance authorization received [employer] will deliver [facility] and dressing supplies to patient's room. Patient will have HHRN to change dressing three times a week.   Patient has cane at home, does not want walker or 3in1 or bedside commode     Expected Discharge Plan: [place] Barriers to Discharge: Continued Medical Work up   Patient Goals and CMS Choice Patient states their goals for this hospitalization and ongoing recovery are:: to go home CMS Medicare.gov Compare Post Acute Care list provided to:: Patient Choice offered to / list presented to : Patient  Expected Discharge Plan and Services Expected Discharge Plan: [place] Choice: [place] arrangements for the past 2 months: Single Family Home Expected Discharge Date: 11/16/18               DME Arranged: Harlin Heys DME Agency: [employer] Date DME Agency Contacted: 11/16/18 Time DME Agency Contacted: [place]: RN [place] [facility]: [facility] [employer] [facility] [employer] Date [place]: 11/16/18 Time [place]: 62 Representative spoke with at [place]: [place]  Prior Living Arrangements/Services Living arrangements for the past 2 months: [place] Lives with:: Spouse Patient language and need for interpreter reviewed:: Yes Do you feel safe going back to the place where you live?: Yes      Need for Family Participation in Patient Care: Yes (Comment) Care giver support system in place?: Yes (comment) Current home services: DME Criminal Activity/Legal Involvement Pertinent to Current Situation/Hospitalization: No - Comment as needed  Activities of Daily Living Home Assistive Devices/Equipment: CBG Meter, Cane (specify quad or straight) ADL Screening (condition at time of admission) Patient's cognitive ability adequate to safely complete daily activities?: Yes Is the patient deaf or have difficulty hearing?: No Does the patient have difficulty seeing, even when wearing glasses/contacts?: No Does the patient have difficulty concentrating, remembering, or making decisions?: No Patient able to express need for assistance with ADLs?: Yes Does the patient have difficulty dressing or bathing?: No Independently performs ADLs?: No Communication: Independent Dressing (OT): Needs assistance Is this a change from baseline?: Change from baseline, expected to last >3 days Grooming: Independent Feeding: Independent Bathing: Needs assistance Is this a change from baseline?: Change from baseline, expected to last >3 days Toileting: Independent In/Out Bed: Independent Walks in Home: Independent with device (comment) Does the patient have difficulty walking or climbing stairs?: No Weakness of Legs: Both Weakness of Arms/Hands: Left  Permission Sought/Granted   Permission granted to share information with : Yes, Verbal Permission Granted     Permission granted to share info w AGENCY: [facility], [place]  Emotional Assessment Appearance:: Appears stated age Attitude/Demeanor/Rapport: Engaged Affect (typically observed): Accepting Orientation: : Oriented to Self, Oriented to Place, Oriented to  Time, Oriented to  Situation Alcohol / Substance Use: Not Applicable Psych Involvement: No (comment)  Admission diagnosis:  left elbow wound dehiscence Patient Active Problem List   Diagnosis Date Noted  . History of open reduction and internal fixation (ORIF) procedure 11/15/2018  . Wound dehiscence, surgical 11/04/2018  . Closed fracture of left olecranon process, initial encounter 09/20/2018  . Chronic anticoagulation 02/10/2014  . Pure hypercholesterolemia 02/10/2014  . Encounter for therapeutic drug monitoring 06/15/2013  . Atrioventricular block, complete ([place]) 06/07/2013  . Pacemaker-Medtronic 06/07/2013  . Atrial fibrillation ([place]) 02/24/2013   PCP:  Antony Contras, MD Pharmacy:   [id number] - [place], [address] [place] [address] Phone: [phone] Fax: [phone]     Social Determinants of Health (SDOH) Interventions    Readmission Risk Interventions No flowsheet data found.

## 2018-11-17 LAB — GLUCOSE, CAPILLARY
Glucose-Capillary: 96 mg/dL (ref 70–99)
Glucose-Capillary: 185 mg/dL — ABNORMAL HIGH (ref 70–99)
Glucose-Capillary: 123 mg/dL — ABNORMAL HIGH (ref 70–99)

## 2018-11-17 MED ORDER — CEPHALEXIN 250 MG PO CAPS
250.0000 mg | ORAL_CAPSULE | Freq: Two times a day (BID) | ORAL | Status: DC
  Filled 2018-11-17: qty 1

## 2018-11-17 NOTE — Progress Notes (Signed)
Pt given discharge instructions and gone over with her. Pt verbalized understanding. All questions answered. Wound vac dressing changed and hooked up to [employer] wound vac. All belongings gathered to be sent home.

## 2018-11-17 NOTE — TOC Transition Note (Signed)
Transition of Care [facility]) - CM/SW Discharge Note   Patient Details  Name: [name] MRN: [id number] Date of Birth: May 04, 1929  [facility]) CM/SW Contact:  Marilu Favre, RN Phone Number: 11/17/2018, 1:56 PM   Clinical Narrative:       Final next level of care: Home w Home Health Services Barriers to Discharge: No Barriers Identified   Patient Goals and CMS Choice Patient states their goals for this hospitalization and ongoing recovery are:: to go home CMS Medicare.gov Compare Post Acute Care list provided to:: Patient Choice offered to / list presented to : Patient  Discharge Placement                       Discharge Plan and Services   Discharge Planning Services: CM Consult Post Acute Care Choice: Home Health          DME Arranged: N/A DME Agency: [employer] Date DME Agency Contacted: 11/16/18 Time DME Agency Contacted: [place]: PT, RN [place] Agency: [place] (now [facility]) Date [place]: 11/17/18 Time [place]: 1355 Representative spoke with at [place]: [place] ([place]) Interventions     Readmission Risk Interventions No flowsheet data found.

## 2018-11-17 NOTE — Progress Notes (Signed)
Inpatient Diabetes Program Recommendations  AACE/ADA: New Consensus Statement on Inpatient Glycemic Control (2015)  Target Ranges:  Prepandial:   less than 140 mg/dL      Peak postprandial:   less than 180 mg/dL (1-2 hours)      Critically ill patients:  140 - 180 mg/dL   Lab Results  Component Value Date   GLUCAP 185 (H) 11/17/2018   HGBA1C 7.2 (H) 10/21/2018    Review of Glycemic Control Results for [name], [name] (MRN [id number]) as of 11/17/2018 12:23  Ref. Range 11/16/2018 18:33 11/16/2018 21:47 11/17/2018 08:02 11/17/2018 12:01  Glucose-Capillary Latest Ref Range: 70 - 99 mg/dL 190 (H) 96 96 185 (H)   Diabetes history: Type 2 DM Outpatient Diabetes medications: Toujeo 4 units QD Current orders for Inpatient glycemic control: Novolog 0-15 units TID, Novolog 0-5 units QHS, Lantus 4 units QD  Inpatient Diabetes Program Recommendations:    If to remain inpatient:   Noted patient experience hypoglycemia of 48 mg/dL on 6/30.  Consider decreasing correction to Novolog 0-9 units TID.  Thanks, Bronson Curb, MSN, RNC-OB Diabetes Coordinator [phone] (8a-5p)

## 2018-11-17 NOTE — Progress Notes (Signed)
Subjective: 2 Days Post-Op Procedure(s) (LRB): IRRIGATION AND DEBRIDEMENT LEFT ELBOW, WOUND VAC (Left) Patient reports pain as mild.  Doing well.  Ready to go home, but still waiting for insurance to approve wound vac.  Objective: Vital signs in last 24 hours: Temp:  [98 F (36.7 C)-99.4 F (37.4 C)] 98.9 F (37.2 C) (07/01 0500) Pulse Rate:  [62-88] 88 (07/01 0500) Resp:  [16-20] 18 (07/01 0500) BP: (121-162)/(58-84) 140/78 (07/01 0500) SpO2:  [93 %-95 %] 94 % (07/01 0500)  Intake/Output from previous day: 06/30 0701 - 07/01 0700 In: 1955.2 [P.O.:480; I.V.:1475.2] Out: -  Intake/Output this shift: No intake/output data recorded.  No results for input(s): HGB in the last 72 hours. No results for input(s): WBC, RBC, HCT, PLT in the last 72 hours. Recent Labs    11/15/18 1448  NA 136  K 3.8  CL 97*  CO2 29  BUN 15  CREATININE 1.28*  GLUCOSE 106*  CALCIUM 10.7*   Recent Labs    11/15/18 1448  INR 1.1    Neurologically intact Neurovascular intact Sensation intact distally Intact pulses distally Dorsiflexion/Plantar flexion intact Incision: dressing C/D/I No cellulitis present Compartment soft  Wound vac in place and functioning 125 cc blood in canister   Assessment/Plan: 2 Days Post-Op Procedure(s) (LRB): IRRIGATION AND DEBRIDEMENT LEFT ELBOW, WOUND VAC (Left) Up with therapy  Continue wound vac to LUE Will need vac changes Monday, Wednesday,friday D/c home with hh nurse once vac has been approved (hopefully today) Will need to contact MD office for d/c order      [name] 11/17/2018, 7:40 AM

## 2018-11-17 NOTE — Progress Notes (Signed)
Physical Therapy Treatment Patient Details Name: [name] MRN: [id number] DOB: 10-07-1928 Today's Date: 11/17/2018    History of Present Illness Patient s/p Excisional debridement of skin, subcutaneous tissue, tendon, bone at L elbow on 11/15/2018. PMH significant for CKD, COPD, DM, HTN.    PT Comments    Patient seen to progress mobility and ensure safety with gait and stair navigation. Patient continues with mild unsteadiness - continue to recommend HHPT at discharge to ensure safety in the home. Discussion on use of SPC at discharge for added stability. Will continue to follow.     Follow Up Recommendations  Home health PT;Supervision for mobility/OOB     Equipment Recommendations  3in1 (PT)    Recommendations for Other Services       Precautions / Restrictions Precautions Precautions: Fall Restrictions Weight Bearing Restrictions: No Other Position/Activity Restrictions: assumed NWB - in sling    Mobility  Bed Mobility Overal bed mobility: Needs Assistance Bed Mobility: Supine to Sit;Sit to Supine     Supine to sit: Supervision Sit to supine: Supervision   General bed mobility comments: for safety   Transfers Overall transfer level: Needs assistance Equipment used: 1 person hand held assist;Straight cane Transfers: Sit to/from Stand Sit to Stand: Min guard         General transfer comment: for safety - mild unsteadiness  Ambulation/Gait Ambulation/Gait assistance: Min guard Gait Distance (Feet): 200 Feet Assistive device: Straight cane Gait Pattern/deviations: Step-through pattern;Decreased stride length;Trunk flexed Gait velocity: decreased   General Gait Details: mild unsteadiness - discussed using SPC at discharge   Stairs Stairs: Yes Stairs assistance: Supervision;Min guard Stair Management: One rail Right;Step to pattern;Forwards Number of Stairs: 2     Wheelchair Mobility    Modified Rankin (Stroke Patients Only)       Balance  Overall balance assessment: Mild deficits observed, not formally tested                                          Cognition Arousal/Alertness: Awake/alert Behavior During Therapy: WFL for tasks assessed/performed Overall Cognitive Status: Within Functional Limits for tasks assessed                                        Exercises      General Comments        Pertinent Vitals/Pain Pain Assessment: No/denies pain    Home Living                      Prior Function            PT Goals (current goals can now be found in the care plan section) Acute Rehab PT Goals Patient Stated Goal: return home PT Goal Formulation: With patient Time For Goal Achievement: 11/30/18 Potential to Achieve Goals: Good Progress towards PT goals: Progressing toward goals    Frequency    Min 3X/week      PT Plan Current plan remains appropriate    Co-evaluation              AM-PAC PT "6 Clicks" Mobility   Outcome Measure  Help needed turning from your back to your side while in a flat bed without using bedrails?: A Little Help needed moving from lying on your back to sitting  on the side of a flat bed without using bedrails?: A Little Help needed moving to and from a bed to a chair (including a wheelchair)?: A Little Help needed standing up from a chair using your arms (e.g., wheelchair or bedside chair)?: A Little Help needed to walk in hospital room?: A Little Help needed climbing 3-5 steps with a railing? : A Little 6 Click Score: 18    End of Session Equipment Utilized During Treatment: Gait belt(L UE sling) Activity Tolerance: Patient tolerated treatment well Patient left: in bed;with call bell/phone within reach Nurse Communication: Mobility status PT Visit Diagnosis: Unsteadiness on feet (R26.81);Other abnormalities of gait and mobility (R26.89);Muscle weakness (generalized) (M62.81);History of falling (Z91.81)     Time:  6945-0388 PT Time Calculation (min) (ACUTE ONLY): 15 min  Charges:  $Gait Training: 8-22 mins                     Lanney Gins, PT, DPT Supplemental Physical Therapist 11/17/18 1:04 PM Pager: [phone] Office: [phone]

## 2018-11-17 NOTE — Progress Notes (Signed)
Rep from [facility] called this RN and stated they would be here this afternoon to drop of vac and supplies. CM and PA made aware.

## 2018-11-18 ENCOUNTER — Telehealth: Payer: Self-pay

## 2018-11-18 ENCOUNTER — Emergency Department (HOSPITAL_COMMUNITY)
Admission: EM | Admit: 2018-11-18 | Discharge: 2018-11-19 | Disposition: A | Discharge status: Home / Self Care | Payer: Medicare Other | Attending: Emergency Medicine | Admitting: Emergency Medicine

## 2018-11-18 ENCOUNTER — Telehealth: Payer: Self-pay | Admitting: Orthopaedic Surgery

## 2018-11-18 ENCOUNTER — Other Ambulatory Visit: Payer: Self-pay

## 2018-11-18 DIAGNOSIS — Z950 Presence of cardiac pacemaker: Secondary | ICD-10-CM | POA: Insufficient documentation

## 2018-11-18 DIAGNOSIS — Z79899 Other long term (current) drug therapy: Secondary | ICD-10-CM | POA: Insufficient documentation

## 2018-11-18 DIAGNOSIS — F17210 Nicotine dependence, cigarettes, uncomplicated: Secondary | ICD-10-CM | POA: Diagnosis not present

## 2018-11-18 DIAGNOSIS — Y658 Other specified misadventures during surgical and medical care: Secondary | ICD-10-CM | POA: Diagnosis not present

## 2018-11-18 DIAGNOSIS — R0902 Hypoxemia: Secondary | ICD-10-CM | POA: Diagnosis not present

## 2018-11-18 DIAGNOSIS — Z7901 Long term (current) use of anticoagulants: Secondary | ICD-10-CM | POA: Diagnosis not present

## 2018-11-18 DIAGNOSIS — Z794 Long term (current) use of insulin: Secondary | ICD-10-CM | POA: Insufficient documentation

## 2018-11-18 DIAGNOSIS — T8131XA Disruption of external operation (surgical) wound, not elsewhere classified, initial encounter: Secondary | ICD-10-CM | POA: Diagnosis not present

## 2018-11-18 DIAGNOSIS — Z8673 Personal history of transient ischemic attack (TIA), and cerebral infarction without residual deficits: Secondary | ICD-10-CM | POA: Diagnosis not present

## 2018-11-18 DIAGNOSIS — E1122 Type 2 diabetes mellitus with diabetic chronic kidney disease: Secondary | ICD-10-CM | POA: Diagnosis not present

## 2018-11-18 DIAGNOSIS — T8130XA Disruption of wound, unspecified, initial encounter: Secondary | ICD-10-CM | POA: Insufficient documentation

## 2018-11-18 DIAGNOSIS — W19XXXA Unspecified fall, initial encounter: Secondary | ICD-10-CM | POA: Diagnosis not present

## 2018-11-18 DIAGNOSIS — N183 Chronic kidney disease, stage 3 (moderate): Secondary | ICD-10-CM | POA: Insufficient documentation

## 2018-11-18 DIAGNOSIS — I129 Hypertensive chronic kidney disease with stage 1 through stage 4 chronic kidney disease, or unspecified chronic kidney disease: Secondary | ICD-10-CM | POA: Insufficient documentation

## 2018-11-18 DIAGNOSIS — J449 Chronic obstructive pulmonary disease, unspecified: Secondary | ICD-10-CM | POA: Insufficient documentation

## 2018-11-18 NOTE — Telephone Encounter (Signed)
[name] with [facility] called in regards to patient's wound vac. She states that she was not supposed to see the patient until tomorrow, but patient was discharged home early. When she got to the home, patient had disconnected her wound vac because it did not seem to be working. Per Lujean Rave, the battery had no charge and that was the problem. At this point, patient has been without working vac for several hours. Lujean Rave is concerned about the way the wound vac is put on, as well wound compromise. She needs orders on what to do.  While on hold, waiting for Dr. Sharol Given to come out of room to advise, [name] removed wound vac. She states that this was not put on correctly, there was no adaptic to protect the patient, the sponge was not attached to the wound, etc.  At this moment, Lujean Rave has called 911 because once the vac was removed, she could not get the patient to stop bleeding. She was currently applying pressure as 911 was arriving at the home.   CB for Lujean Rave is [phone]

## 2018-11-18 NOTE — Telephone Encounter (Signed)
IC s/w patient and advised.  She said that a nurse was headed to her home to look at this.

## 2018-11-18 NOTE — Telephone Encounter (Signed)
Please advise What did he put her in?

## 2018-11-18 NOTE — Care Management (Addendum)
11/18/18  11:28  Patient called with concern that wound vac was not functioning.   during the night, they charged it and it was silent for a while and when she woke up it was not working. CM asked that she contact [employer] immediately at [phone].

## 2018-11-18 NOTE — Telephone Encounter (Signed)
It is a [employer] wound vac.  She was d/c with this and a hh nurse for wound vac changes.  They shouold be contacted if not working

## 2018-11-18 NOTE — Care Management (Signed)
11/18/18  11:28  Patient called with concern that wound vac was not functioning. She said it beeped during the night, they charged it and it was silent for a while and when she woke up it was not working. CM asked that she contact [employer] immediately at [phone].

## 2018-11-18 NOTE — Telephone Encounter (Signed)
Patient called and stated that the machine put in her arm by Erlinda Hong is not working,  Please call patient @ [phone]

## 2018-11-18 NOTE — Telephone Encounter (Signed)
[name] with [facility] called concerning wound vac for patient.  Called was transferred to triage.

## 2018-11-19 ENCOUNTER — Other Ambulatory Visit: Payer: Self-pay

## 2018-11-19 NOTE — ED Notes (Signed)
Paged ortho surg, Dr Marlou Sa to [place]

## 2018-11-19 NOTE — Discharge Instructions (Addendum)
Return here as needed. Follow up with Dr. Erlinda Hong on Monday. Call them tomorrow for an appointment. I spoke with his partner Dr. Marlou Sa and he advised you to call them about the appointment Monday.

## 2018-11-19 NOTE — ED Provider Notes (Signed)
[facility] EMERGENCY DEPARTMENT Provider Note   CSN: [id number] Arrival date & time: 11/18/18  1652     History   Chief Complaint Chief Complaint  Patient presents with  . Wound Dehiscence    HPI [name] is a 85 y.o. female.     HPI Patient presents to the emergency department with a wound complication.  Patient had a wound VAC placed on Monday and the patient was unable to charge the wound VAC and it stopped working.  The patient also pulled out the sponge from the wound.  The patient states that there was bleeding from the wound.  Patient states the area started to bleed and that is why she was sent in by the wound care nurse.  Patient denies any other symptoms. the patient denies chest pain, shortness of breath, headache,blurred vision, neck pain, fever, cough, weakness, numbness, dizziness, anorexia, edema, abdominal pain, nausea, vomiting, diarrhea, rash, back pain, dysuria, hematemesis, bloody stool, near syncope, or syncope. Past Medical History:  Diagnosis Date  . Ambulates with cane    straight  . Anxiety   . Arthritis    lower back  . CKD (chronic kidney disease), stage III ([place])   . Closed fracture of left olecranon process   . COPD (chronic obstructive pulmonary disease) ([place])   . Depression   . Diabetes mellitus without complication ([place])    type 2  . Headache   . Hypertension   . Orthostatic hypotension   . Osteopenia   . Pacemaker   . 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 Permanent atrial fibrillation    a.  s/p AVN ablation 2010 with complete heart block s/p PPM (gen change 2015, pt preference for Coumadin).  . Protein calorie malnutrition ([place])   . S/P AV nodal ablation   . TIA (transient ischemic attack)     Patient Active Problem List   Diagnosis Date Noted  . History of open reduction and internal fixation (ORIF) procedure 11/15/2018  . Wound dehiscence, surgical 11/04/2018  .  Closed fracture of left olecranon process, initial encounter 09/20/2018  . Chronic anticoagulation 02/10/2014  . Pure hypercholesterolemia 02/10/2014  . Encounter for therapeutic drug monitoring 06/15/2013  . Atrioventricular block, complete ([place]) 06/07/2013  . Pacemaker-Medtronic 06/07/2013  . Atrial fibrillation ([place]) 02/24/2013    Past Surgical History:  Procedure Laterality Date  . ABLATION  2012   AVN ablation by Dr Lovena Le  . cateracts Bilateral    eyes  . CYST REMOVAL TRUNK     from breast  . I&D EXTREMITY Left 11/04/2018   Procedure: IRRIGATION AND DEBRIDEMENT LEFT ELBOW, WOUND VAC;  Surgeon: Leandrew Koyanagi, MD;  Location: [facility];  Service: Orthopedics;  Laterality: Left;  . I&D EXTREMITY Left 11/15/2018   Procedure: IRRIGATION AND DEBRIDEMENT LEFT ELBOW, WOUND VAC;  Surgeon: Leandrew Koyanagi, MD;  Location: [facility];  Service: Orthopedics;  Laterality: Left;  . ORIF ELBOW FRACTURE Left 10/07/2018   Procedure: OPEN REDUCTION INTERNAL FIXATION (ORIF) LEFT OLECRANON FRACTURE;  Surgeon: Leandrew Koyanagi, MD;  Location: [facility];  Service: Orthopedics;  Laterality: Left;  . PACEMAKER INSERTION    . PERMANENT PACEMAKER GENERATOR CHANGE N/A 07/18/2013   Procedure: PERMANENT PACEMAKER GENERATOR CHANGE;  Surgeon: Deboraha Sprang, MD;  Location: [facility] CATH LAB;  Service: Cardiovascular;  Laterality: N/A;  . WISDOM TOOTH EXTRACTION       OB History   No obstetric history on  file.      Home Medications    Prior to Admission medications   Medication Sig Start Date End Date Taking? Authorizing Provider  BD PEN NEEDLE NANO U/F 32G X 4 MM MISC AS DIRECTED USE WITH TOUJEO/ DX E11.49 IN VITRO 01/21/18   [provider]  calcium-vitamin D (OSCAL WITH D) 500-200 MG-UNIT tablet Take 1 tablet by mouth 3 (three) times daily. 10/07/18   Leandrew Koyanagi, MD  cephALEXin (KEFLEX) 250 MG capsule Take 1 capsule (250 mg total) by mouth 2 (two) times daily. 11/16/18   Aundra Dubin, PA-C   clonazePAM (KLONOPIN) 0.5 MG tablet Take 0.5 mg by mouth at bedtime. 01/02/17   [provider]  escitalopram (LEXAPRO) 20 MG tablet Take 20 mg by mouth daily.  02/10/15   [provider]  hydrochlorothiazide (HYDRODIURIL) 12.5 MG tablet TAKE 0.5 TABLETS (6.25 MG TOTAL) BY MOUTH DAILY. 10/27/18   [name], MD  HYDROcodone-acetaminophen [facility]) 5-325 MG tablet Take 1/2-1 tab po bid prn pain 11/16/18   Aundra Dubin, PA-C  Insulin Glargine (TOUJEO SOLOSTAR) 300 UNIT/ML SOPN Inject 4 Units into the skin daily.     [provider]  losartan (COZAAR) 100 MG tablet Take 100 mg by mouth daily.    [provider]  magic mouthwash SOLN Take 5 mLs by mouth 3 (three) times daily.  03/08/18   [provider]  Melatonin 5 MG TABS Take 5 mg by mouth at bedtime.     [provider]  mirtazapine (REMERON) 15 MG tablet Take 15 mg by mouth at bedtime. 10/01/18   [provider]  Multiple Vitamin (MULTIVITAMIN) tablet Take 1 tablet by mouth daily.    [provider]  pregabalin (LYRICA) 50 MG capsule Take 1 capsule (50 mg total) by mouth daily. Patient taking differently: Take 50 mg by mouth at bedtime.  10/18/18   [name], DPM  PREVIDENT 5000 DRY MOUTH 1.1 % GEL dental gel Take 1 application by mouth See admin instructions. Use to brush teeth every day, replacement for regular toothpaste 08/23/18   [provider]  promethazine (PHENERGAN) 12.5 MG tablet Take 1 tablet (12.5 mg total) by mouth 3 (three) times daily as needed for nausea or vomiting. 11/02/18   Aundra Dubin, PA-C  Rivaroxaban (XARELTO) 15 MG TABS tablet Take 1 tablet (15 mg total) by mouth daily with supper. 09/29/18   Deboraha Sprang, MD    Family History Family History  Problem Relation Age of Onset  . Stroke Father     Social History Social History   Tobacco Use  . Smoking status: Current Every Day Smoker    Packs/day: 0.50    Years: 67.00    Pack  years: 33.50    Types: Cigarettes  . Smokeless tobacco: Never Used  Substance Use Topics  . Alcohol use: No    Alcohol/week: 0.0 standard drinks  . Drug use: No     Allergies   Eliquis [apixaban], Gabapentin, Rozerem [ramelteon], Tikosyn [dofetilide], Tradjenta [linagliptin], and Temazepam   Review of Systems Review of Systems All other systems negative except as documented in the HPI. All pertinent positives and negatives as reviewed in the HPI.  Physical Exam Updated Vital Signs BP (!) 154/100   Pulse 60   Temp 98 F (36.7 C) (Oral)   Resp 20   LMP  (LMP Unknown)   SpO2 97%   Physical Exam Vitals signs and nursing note reviewed.  Constitutional:  General: She is not in acute distress.    Appearance: She is well-developed.  HENT:     Head: Normocephalic and atraumatic.  Eyes:     Pupils: Pupils are equal, round, and reactive to light.  Pulmonary:     Effort: Pulmonary effort is normal.  Musculoskeletal:       Arms:  Skin:    General: Skin is warm and dry.  Neurological:     Mental Status: She is alert and oriented to person, place, and time.      ED Treatments / Results  Labs (all labs ordered are listed, but only abnormal results are displayed) Labs Reviewed - No data to display  EKG None  Radiology No results found.  Procedures Procedures (including critical care time)  Medications Ordered in ED Medications - No data to display   Initial Impression / Assessment and Plan / ED Course  I have reviewed the triage vital signs and the nursing notes.  Pertinent labs & imaging results that were available during my care of the patient were reviewed by me and considered in my medical decision making (see chart for details).        I spoke with Dr. Marlou Sa of orthopedics.  He advised that he would like to have the patient placed in a wet-to-dry dressing in a sling and referred to their office on Monday.  Patient is advised this plan and all  questions were answered.  We did advise her to return here for any worsening in her condition. Final Clinical Impressions(s) / ED Diagnoses   Final diagnoses:  Wound dehiscence    ED Discharge Orders    None       Dalia Heading, PA-C 11/19/18 2536    Mesner, Corene Cornea, MD 11/20/18 [phone]

## 2018-11-22 ENCOUNTER — Telehealth: Payer: Self-pay

## 2018-11-22 NOTE — Telephone Encounter (Signed)
Talked with patient and advised her that Dr. Erlinda Hong would like to see her in the morning.  Patient stated that she understands and appointment has been made.  Patient stated that she has a dressing on her left elbow and that the home health nurse has been coming by and changing the dressing.

## 2018-11-22 NOTE — Telephone Encounter (Signed)
Just saw this. Looking through message thread and it looks like you are aware as she is coming into office in the am.

## 2018-11-22 NOTE — Telephone Encounter (Signed)
Pt will be receiving a new monitor in 7-10 business days.

## 2018-11-23 ENCOUNTER — Ambulatory Visit (INDEPENDENT_AMBULATORY_CARE_PROVIDER_SITE_OTHER): Payer: Medicare Other | Admitting: Orthopaedic Surgery

## 2018-11-23 ENCOUNTER — Other Ambulatory Visit: Payer: Self-pay

## 2018-11-23 ENCOUNTER — Encounter: Payer: Self-pay | Admitting: Orthopaedic Surgery

## 2018-11-23 DIAGNOSIS — S52022S Displaced fracture of olecranon process without intraarticular extension of left ulna, sequela: Secondary | ICD-10-CM

## 2018-11-23 NOTE — Progress Notes (Signed)
Patient ID: [name], female   DOB: 06/12/1928, 85 y.o.   MRN: [id number]  Kikue comes in today for a wound check status post left elbow I&D and wound VAC placement.  She has some issues with the wound VAC over the weekend.  The wound VAC was taken off and wet-to-dry dressings was placed. She has been doing fine since then.  On exam today the wound is hemostatic.  There is some early granulation tissue.  I spoke with the wound nurse who will see her tomorrow therefore I will place her in wet-to-dry dressings.  Her wound VAC died due to the lack of a charge and I am afraid that if I put it on today it will die again on the way home and leave her with a nonfunctioning wound VAC therefore I would like her to charge it soon as she gets home so that it is ready for application tomorrow with the wound nurse.  I would like to recheck everything in 2 weeks.

## 2018-11-24 ENCOUNTER — Telehealth: Payer: Self-pay | Admitting: Radiology

## 2018-11-24 NOTE — Telephone Encounter (Signed)
Linda with [facility] called regarding patient's wound vac. She went out to put the vac back on and the battery is not holding a charge. She has notified the company that the patient needs a replacement sent out, and does not feel that she should put the vac on until she receives the new one. Per Dr. Erlinda Hong, wet to dry dressing until new vac arrives, then place wound vac. Vaughan Basta advised.

## 2018-11-25 ENCOUNTER — Ambulatory Visit (INDEPENDENT_AMBULATORY_CARE_PROVIDER_SITE_OTHER): Payer: Medicare Other | Admitting: *Deleted

## 2018-11-25 ENCOUNTER — Other Ambulatory Visit: Payer: Self-pay | Admitting: Orthopaedic Surgery

## 2018-11-25 DIAGNOSIS — I442 Atrioventricular block, complete: Secondary | ICD-10-CM

## 2018-11-25 NOTE — Telephone Encounter (Signed)
Can you call ijn 250bid

## 2018-11-25 NOTE — Telephone Encounter (Signed)
For how long? Quantity?

## 2018-11-25 NOTE — Telephone Encounter (Signed)
Transmission added to the schedule for processing.

## 2018-11-25 NOTE — Telephone Encounter (Signed)
Please advise 

## 2018-11-25 NOTE — Telephone Encounter (Signed)
TRANSMISSION RECEIVED

## 2018-11-26 ENCOUNTER — Telehealth: Payer: Self-pay | Admitting: Orthopaedic Surgery

## 2018-11-26 LAB — CUP PACEART REMOTE DEVICE CHECK
Battery Impedance: 479 Ohm
Battery Remaining Longevity: 87 mo
Battery Voltage: 2.78 V
Brady Statistic RV Percent Paced: 100 %
Date Time Interrogation Session: 20200709145343
Implantable Lead Implant Date: 20080416
Implantable Lead Implant Date: 20080416
Implantable Lead Location: 753859
Implantable Lead Location: 753860
Implantable Lead Model: 4076
Implantable Lead Model: 5076
Implantable Pulse Generator Implant Date: 20150302
Lead Channel Impedance Value: 459 Ohm
Lead Channel Impedance Value: 67 Ohm
Lead Channel Pacing Threshold Amplitude: 0.625 V
Lead Channel Pacing Threshold Pulse Width: 0.4 ms
Lead Channel Setting Pacing Amplitude: 2.5 V
Lead Channel Setting Pacing Pulse Width: 0.4 ms
Lead Channel Setting Sensing Sensitivity: 5.6 mV

## 2018-11-26 NOTE — Telephone Encounter (Signed)
[name]/[facility] wants to know the suction amount. Is it 100 or 125. Was @ 100

## 2018-11-26 NOTE — Telephone Encounter (Signed)
Sent to pharmacy 

## 2018-11-26 NOTE — Telephone Encounter (Signed)
Lujean Rave was called and notified that per Dr Erlinda Hong requested the vac be on 100, she understood and stated that she will change it.

## 2018-11-26 NOTE — Telephone Encounter (Signed)
2 weeks # 28 (that should cover her until we see back in office)

## 2018-11-30 DIAGNOSIS — I129 Hypertensive chronic kidney disease with stage 1 through stage 4 chronic kidney disease, or unspecified chronic kidney disease: Secondary | ICD-10-CM | POA: Diagnosis not present

## 2018-11-30 DIAGNOSIS — E1122 Type 2 diabetes mellitus with diabetic chronic kidney disease: Secondary | ICD-10-CM | POA: Diagnosis not present

## 2018-11-30 DIAGNOSIS — I4819 Other persistent atrial fibrillation: Secondary | ICD-10-CM | POA: Diagnosis not present

## 2018-11-30 DIAGNOSIS — E7800 Pure hypercholesterolemia, unspecified: Secondary | ICD-10-CM | POA: Diagnosis not present

## 2018-11-30 DIAGNOSIS — S52022A Displaced fracture of olecranon process without intraarticular extension of left ulna, initial encounter for closed fracture: Secondary | ICD-10-CM | POA: Diagnosis not present

## 2018-11-30 DIAGNOSIS — I951 Orthostatic hypotension: Secondary | ICD-10-CM | POA: Diagnosis not present

## 2018-11-30 DIAGNOSIS — J440 Chronic obstructive pulmonary disease with acute lower respiratory infection: Secondary | ICD-10-CM | POA: Diagnosis not present

## 2018-11-30 DIAGNOSIS — F329 Major depressive disorder, single episode, unspecified: Secondary | ICD-10-CM | POA: Diagnosis not present

## 2018-11-30 DIAGNOSIS — E46 Unspecified protein-calorie malnutrition: Secondary | ICD-10-CM | POA: Diagnosis not present

## 2018-11-30 DIAGNOSIS — T8133XA Disruption of traumatic injury wound repair, initial encounter: Secondary | ICD-10-CM | POA: Diagnosis not present

## 2018-11-30 DIAGNOSIS — N183 Chronic kidney disease, stage 3 (moderate): Secondary | ICD-10-CM | POA: Diagnosis not present

## 2018-11-30 DIAGNOSIS — F17210 Nicotine dependence, cigarettes, uncomplicated: Secondary | ICD-10-CM | POA: Diagnosis not present

## 2018-11-30 DIAGNOSIS — I442 Atrioventricular block, complete: Secondary | ICD-10-CM | POA: Diagnosis not present

## 2018-11-30 DIAGNOSIS — Z7901 Long term (current) use of anticoagulants: Secondary | ICD-10-CM | POA: Diagnosis not present

## 2018-11-30 DIAGNOSIS — Z794 Long term (current) use of insulin: Secondary | ICD-10-CM | POA: Diagnosis not present

## 2018-11-30 DIAGNOSIS — M8580 Other specified disorders of bone density and structure, unspecified site: Secondary | ICD-10-CM | POA: Diagnosis not present

## 2018-12-01 NOTE — Progress Notes (Signed)
Remote pacemaker transmission.   

## 2018-12-06 ENCOUNTER — Telehealth: Payer: Self-pay

## 2018-12-06 NOTE — Telephone Encounter (Signed)
I called Vaughan Basta back at [phone] and advised.

## 2018-12-06 NOTE — Telephone Encounter (Signed)
Ok that's fine

## 2018-12-06 NOTE — Telephone Encounter (Signed)
Linda with [facility] would like an order not to put wound Vac back on patient today, Monday, 12/06/2018.  Stated that patient did not won't wound vac put back on due to having an appointment on Tuesday, 12/07/2018 with Dr. Erlinda Hong.  Vaughan Basta stated that she will pick up orders on Wednesday, 12/08/2018.  Cb# is [phone].  Please advise.  Thank you.

## 2018-12-06 NOTE — Telephone Encounter (Signed)
Please advise 

## 2018-12-07 ENCOUNTER — Telehealth: Payer: Self-pay

## 2018-12-07 ENCOUNTER — Encounter: Payer: Self-pay | Admitting: Orthopaedic Surgery

## 2018-12-07 ENCOUNTER — Ambulatory Visit (INDEPENDENT_AMBULATORY_CARE_PROVIDER_SITE_OTHER): Payer: Medicare Other | Admitting: Physician Assistant

## 2018-12-07 VITALS — Ht 63.0 in | Wt 100.2 lb

## 2018-12-07 DIAGNOSIS — T8131XS Disruption of external operation (surgical) wound, not elsewhere classified, sequela: Secondary | ICD-10-CM

## 2018-12-07 NOTE — Telephone Encounter (Signed)
Linda from [facility] was called and advised per Dr Erlinda Hong wants patient to have another wound vac applied. Vaughan Basta advised they would be going out /tomorrow to apply it. Wound vac removed 12/07/2018 in our office.

## 2018-12-07 NOTE — Progress Notes (Signed)
Post-Op Visit Note   Patient: [name]           Date of Birth: 1928-09-04           MRN: [id number] Visit Date: 12/07/2018 PCP: Antony Contras, MD   Assessment & Plan:  Chief Complaint:  Chief Complaint  Patient presents with  . Left Elbow - Routine Post Op    11/15/2018 I&D left elbow   Visit Diagnoses:  1. Postoperative wound dehiscence, sequela     Plan: Patient is a pleasant 85 year old female who presents our clinic today little over 3 weeks status post I&D left elbow with wound VAC placement 11/15/2018.  She has been doing well.  She has no pain.  She has been getting wound VAC changes from [facility].  Examination of her left elbow reveals good beefy red granulation tissue.  She does still have some tendon exposure.  Her distal incision is healing without complication.  Nylon sutures are in tact.  She has mild to moderate swelling throughout.  At this point, nylon sutures were removed.  We have applied wet-to-dry dressings to the wound.  We have recommended continuation of wound VAC at this time.  I have sent a message to Fritzi Mandes with [facility].  Once the granulation tissue has grown over the tendon, we will transition from wound VAC care to wet-to-dry dressing changes.  Jennet will follow-up with [place] in 2 weeks time for repeat evaluation.  She will call with concerns or questions in the meantime.  Follow-Up Instructions: Return in about 2 weeks (around 12/21/2018).   Orders:  No orders of the defined types were placed in this encounter.  No orders of the defined types were placed in this encounter.   Imaging: No new imaging  PMFS History: Patient Active Problem List   Diagnosis Date Noted  . History of open reduction and internal fixation (ORIF) procedure 11/15/2018  . Wound dehiscence, surgical 11/04/2018  . Closed fracture of left olecranon process, initial encounter 09/20/2018  . Chronic anticoagulation 02/10/2014  . Pure hypercholesterolemia 02/10/2014  .  Encounter for therapeutic drug monitoring 06/15/2013  . Atrioventricular block, complete ([place]) 06/07/2013  . Pacemaker-Medtronic 06/07/2013  . Atrial fibrillation ([place]) 02/24/2013   Past Medical History:  Diagnosis Date  . Ambulates with cane    straight  . Anxiety   . Arthritis    lower back  . CKD (chronic kidney disease), stage III ([place])   . Closed fracture of left olecranon process   . COPD (chronic obstructive pulmonary disease) ([place])   . Depression   . Diabetes mellitus without complication ([place])    type 2  . Headache   . Hypertension   . Orthostatic hypotension   . Osteopenia   . Pacemaker   . 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 Permanent atrial fibrillation    a.  s/p AVN ablation 2010 with complete heart block s/p PPM (gen change 2015, pt preference for Coumadin).  . Protein calorie malnutrition ([place])   . S/P AV nodal ablation   . TIA (transient ischemic attack)     Family History  Problem Relation Age of Onset  . Stroke Father     Past Surgical History:  Procedure Laterality Date  . ABLATION  2012   AVN ablation by Dr Lovena Le  . cateracts Bilateral    eyes  . CYST REMOVAL TRUNK     from breast  . I&D EXTREMITY Left 11/04/2018  Procedure: IRRIGATION AND DEBRIDEMENT LEFT ELBOW, WOUND VAC;  Surgeon: Leandrew Koyanagi, MD;  Location: [facility];  Service: Orthopedics;  Laterality: Left;  . I&D EXTREMITY Left 11/15/2018   Procedure: IRRIGATION AND DEBRIDEMENT LEFT ELBOW, WOUND VAC;  Surgeon: Leandrew Koyanagi, MD;  Location: [facility];  Service: Orthopedics;  Laterality: Left;  . ORIF ELBOW FRACTURE Left 10/07/2018   Procedure: OPEN REDUCTION INTERNAL FIXATION (ORIF) LEFT OLECRANON FRACTURE;  Surgeon: Leandrew Koyanagi, MD;  Location: [facility];  Service: Orthopedics;  Laterality: Left;  . PACEMAKER INSERTION    . PERMANENT PACEMAKER GENERATOR CHANGE N/A 07/18/2013   Procedure: PERMANENT PACEMAKER GENERATOR CHANGE;  Surgeon:  Deboraha Sprang, MD;  Location: [facility] CATH LAB;  Service: Cardiovascular;  Laterality: N/A;  . 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 Smoking status: Current Every Day Smoker    Packs/day: 0.50    Years: 67.00    Pack years: 33.50    Types: Cigarettes  . Smokeless tobacco: Never Used  Substance and Sexual Activity  . Alcohol use: No    Alcohol/week: 0.0 standard drinks  . Drug use: No  . Sexual activity: Not on file

## 2018-12-10 ENCOUNTER — Telehealth: Payer: Self-pay

## 2018-12-10 NOTE — Telephone Encounter (Signed)
Talked with Vaughan Basta with [facility] and advised her of message below per [place].

## 2018-12-10 NOTE — Telephone Encounter (Signed)
Linda with [facility] called stating that she had removed the wound vac, but she put it back on.  Would like to know if she could remove the wound vac on Monday, 12/13/2018 and apply a wet to dry dressing?  Stated that it looks healed enough to remove the wound vac.  Cb# is [phone].  Please advise.  Thank you.

## 2018-12-10 NOTE — Telephone Encounter (Signed)
Keep doing wound vac until we see her back in office please.  She still had exposed tendon when we saw her last

## 2018-12-18 DIAGNOSIS — S51002A Unspecified open wound of left elbow, initial encounter: Secondary | ICD-10-CM | POA: Diagnosis not present

## 2018-12-18 DIAGNOSIS — T8189XA Other complications of procedures, not elsewhere classified, initial encounter: Secondary | ICD-10-CM | POA: Diagnosis not present

## 2018-12-21 ENCOUNTER — Encounter: Payer: Self-pay | Admitting: Orthopaedic Surgery

## 2018-12-21 ENCOUNTER — Ambulatory Visit (INDEPENDENT_AMBULATORY_CARE_PROVIDER_SITE_OTHER): Payer: Medicare Other | Admitting: Orthopaedic Surgery

## 2018-12-21 VITALS — Ht 63.0 in | Wt 100.0 lb

## 2018-12-21 DIAGNOSIS — T8131XS Disruption of external operation (surgical) wound, not elsewhere classified, sequela: Secondary | ICD-10-CM

## 2018-12-21 NOTE — Progress Notes (Signed)
Patient ID: [name], female   DOB: Aug 22, 1928, 85 y.o.   MRN: [id number]  Misti returns today for wound check of her left elbow.  She is doing well overall.  The wound VAC was removed today and she has beefy red granulation tissue throughout the wound bed.  No evidence of infection or worsening.  At this point we will discontinue the wound VAC and begin wet-to-dry dressings twice a day with home health nursing.  I would like to recheck her in 1 to 3 weeks.

## 2018-12-29 DIAGNOSIS — E1122 Type 2 diabetes mellitus with diabetic chronic kidney disease: Secondary | ICD-10-CM | POA: Diagnosis not present

## 2018-12-29 DIAGNOSIS — T8133XA Disruption of traumatic injury wound repair, initial encounter: Secondary | ICD-10-CM | POA: Diagnosis not present

## 2018-12-29 DIAGNOSIS — F329 Major depressive disorder, single episode, unspecified: Secondary | ICD-10-CM | POA: Diagnosis not present

## 2018-12-29 DIAGNOSIS — Z7901 Long term (current) use of anticoagulants: Secondary | ICD-10-CM | POA: Diagnosis not present

## 2018-12-29 DIAGNOSIS — I951 Orthostatic hypotension: Secondary | ICD-10-CM | POA: Diagnosis not present

## 2018-12-29 DIAGNOSIS — F17210 Nicotine dependence, cigarettes, uncomplicated: Secondary | ICD-10-CM | POA: Diagnosis not present

## 2018-12-29 DIAGNOSIS — M8580 Other specified disorders of bone density and structure, unspecified site: Secondary | ICD-10-CM | POA: Diagnosis not present

## 2018-12-29 DIAGNOSIS — J440 Chronic obstructive pulmonary disease with acute lower respiratory infection: Secondary | ICD-10-CM | POA: Diagnosis not present

## 2018-12-29 DIAGNOSIS — S52022A Displaced fracture of olecranon process without intraarticular extension of left ulna, initial encounter for closed fracture: Secondary | ICD-10-CM | POA: Diagnosis not present

## 2018-12-29 DIAGNOSIS — Z794 Long term (current) use of insulin: Secondary | ICD-10-CM | POA: Diagnosis not present

## 2018-12-29 DIAGNOSIS — N183 Chronic kidney disease, stage 3 (moderate): Secondary | ICD-10-CM | POA: Diagnosis not present

## 2018-12-29 DIAGNOSIS — E7800 Pure hypercholesterolemia, unspecified: Secondary | ICD-10-CM | POA: Diagnosis not present

## 2018-12-29 DIAGNOSIS — E46 Unspecified protein-calorie malnutrition: Secondary | ICD-10-CM | POA: Diagnosis not present

## 2018-12-29 DIAGNOSIS — I442 Atrioventricular block, complete: Secondary | ICD-10-CM | POA: Diagnosis not present

## 2018-12-29 DIAGNOSIS — I129 Hypertensive chronic kidney disease with stage 1 through stage 4 chronic kidney disease, or unspecified chronic kidney disease: Secondary | ICD-10-CM | POA: Diagnosis not present

## 2018-12-29 DIAGNOSIS — I4819 Other persistent atrial fibrillation: Secondary | ICD-10-CM | POA: Diagnosis not present

## 2019-01-11 ENCOUNTER — Ambulatory Visit (INDEPENDENT_AMBULATORY_CARE_PROVIDER_SITE_OTHER): Payer: Medicare Other

## 2019-01-11 ENCOUNTER — Ambulatory Visit (INDEPENDENT_AMBULATORY_CARE_PROVIDER_SITE_OTHER): Payer: Medicare Other | Admitting: Orthopaedic Surgery

## 2019-01-11 DIAGNOSIS — S52022S Displaced fracture of olecranon process without intraarticular extension of left ulna, sequela: Secondary | ICD-10-CM

## 2019-01-11 DIAGNOSIS — T8131XS Disruption of external operation (surgical) wound, not elsewhere classified, sequela: Secondary | ICD-10-CM | POA: Diagnosis not present

## 2019-01-11 NOTE — Progress Notes (Signed)
Post-Op Visit Note   Patient: [name]           Date of Birth: 1928-05-24           MRN: [id number] Visit Date: 01/11/2019 PCP: Antony Contras, MD   Assessment & Plan:  Chief Complaint:  Chief Complaint  Patient presents with  . Left Elbow - Wound Check   Visit Diagnoses:  1. Postoperative wound dehiscence, sequela   2. Closed fracture of left olecranon process, sequela     Plan: Deysi status post left elbow I&D for surgical wound dehiscence and hardware removal.  She has been doing well with wet-to-dry dressings twice a day.  She comes in today for a wound check.  She reports no pain.  Her range of motion is still limited although this is slightly improved compared to the last visit.  The wound is significantly smaller in size and has healed significantly.  There is no evidence of infection.  We will continue with local wound care by the home health nurse.  I would like her to begin OT at this point for joint mobilization and strengthening.  I would like to recheck her in 6 weeks.  Follow-Up Instructions: Return in about 6 weeks (around 02/22/2019).   Orders:  Orders Placed This Encounter  Procedures  . XR Elbow 2 Views Left   No orders of the defined types were placed in this encounter.   Imaging: Xr Elbow 2 Views Left  Result Date: 01/11/2019 Healed olecranon fracture.  No complications.   PMFS History: Patient Active Problem List   Diagnosis Date Noted  . History of open reduction and internal fixation (ORIF) procedure 11/15/2018  . Wound dehiscence, surgical 11/04/2018  . Closed fracture of left olecranon process, initial encounter 09/20/2018  . Chronic anticoagulation 02/10/2014  . Pure hypercholesterolemia 02/10/2014  . Encounter for therapeutic drug monitoring 06/15/2013  . Atrioventricular block, complete ([place]) 06/07/2013  . Pacemaker-Medtronic 06/07/2013  . Atrial fibrillation ([place]) 02/24/2013   Past Medical History:  Diagnosis Date  . Ambulates  with cane    straight  . Anxiety   . Arthritis    lower back  . CKD (chronic kidney disease), stage III ([place])   . Closed fracture of left olecranon process   . COPD (chronic obstructive pulmonary disease) ([place])   . Depression   . Diabetes mellitus without complication ([place])    type 2  . Headache   . Hypertension   . Orthostatic hypotension   . Osteopenia   . Pacemaker   . 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 Permanent atrial fibrillation    a.  s/p AVN ablation 2010 with complete heart block s/p PPM (gen change 2015, pt preference for Coumadin).  . Protein calorie malnutrition ([place])   . S/P AV nodal ablation   . TIA (transient ischemic attack)     Family History  Problem Relation Age of Onset  . Stroke Father     Past Surgical History:  Procedure Laterality Date  . ABLATION  2012   AVN ablation by Dr Lovena Le  . cateracts Bilateral    eyes  . CYST REMOVAL TRUNK     from breast  . I&D EXTREMITY Left 11/04/2018   Procedure: IRRIGATION AND DEBRIDEMENT LEFT ELBOW, WOUND VAC;  Surgeon: Leandrew Koyanagi, MD;  Location: [facility];  Service: Orthopedics;  Laterality: Left;  . I&D EXTREMITY Left 11/15/2018   Procedure: IRRIGATION AND DEBRIDEMENT LEFT ELBOW, WOUND  VAC;  Surgeon: Leandrew Koyanagi, MD;  Location: [facility];  Service: Orthopedics;  Laterality: Left;  . ORIF ELBOW FRACTURE Left 10/07/2018   Procedure: OPEN REDUCTION INTERNAL FIXATION (ORIF) LEFT OLECRANON FRACTURE;  Surgeon: Leandrew Koyanagi, MD;  Location: [facility];  Service: Orthopedics;  Laterality: Left;  . PACEMAKER INSERTION    . PERMANENT PACEMAKER GENERATOR CHANGE N/A 07/18/2013   Procedure: PERMANENT PACEMAKER GENERATOR CHANGE;  Surgeon: Deboraha Sprang, MD;  Location: [facility] CATH LAB;  Service: Cardiovascular;  Laterality: N/A;  . 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 Smoking status: Current  Every Day Smoker    Packs/day: 0.50    Years: 67.00    Pack years: 33.50    Types: Cigarettes  . Smokeless tobacco: Never Used  Substance and Sexual Activity  . Alcohol use: No    Alcohol/week: 0.0 standard drinks  . Drug use: No  . Sexual activity: Not on file

## 2019-01-17 ENCOUNTER — Other Ambulatory Visit: Payer: Self-pay | Admitting: Physician Assistant

## 2019-01-18 ENCOUNTER — Encounter (HOSPITAL_COMMUNITY): Payer: Self-pay | Admitting: Emergency Medicine

## 2019-01-18 ENCOUNTER — Emergency Department (HOSPITAL_COMMUNITY)
Admission: EM | Admit: 2019-01-18 | Discharge: 2019-01-18 | Discharge status: Against Medical Advice | Payer: Medicare Other | Attending: Emergency Medicine | Admitting: Emergency Medicine

## 2019-01-18 ENCOUNTER — Other Ambulatory Visit: Payer: Self-pay

## 2019-01-18 DIAGNOSIS — Z5321 Procedure and treatment not carried out due to patient leaving prior to being seen by health care provider: Secondary | ICD-10-CM | POA: Diagnosis not present

## 2019-01-18 DIAGNOSIS — R110 Nausea: Secondary | ICD-10-CM | POA: Diagnosis not present

## 2019-01-18 DIAGNOSIS — R197 Diarrhea, unspecified: Secondary | ICD-10-CM | POA: Diagnosis present

## 2019-01-18 NOTE — ED Triage Notes (Signed)
Pt c/o nausea and diarrhea since yesterday

## 2019-01-19 ENCOUNTER — Other Ambulatory Visit: Payer: Self-pay

## 2019-01-19 ENCOUNTER — Emergency Department (HOSPITAL_COMMUNITY)
Admission: EM | Admit: 2019-01-19 | Discharge: 2019-01-19 | Disposition: A | Discharge status: Home / Self Care | Payer: Medicare Other | Attending: Emergency Medicine | Admitting: Emergency Medicine

## 2019-01-19 ENCOUNTER — Encounter (HOSPITAL_COMMUNITY): Payer: Self-pay

## 2019-01-19 DIAGNOSIS — N183 Chronic kidney disease, stage 3 (moderate): Secondary | ICD-10-CM | POA: Insufficient documentation

## 2019-01-19 DIAGNOSIS — Z950 Presence of cardiac pacemaker: Secondary | ICD-10-CM | POA: Diagnosis not present

## 2019-01-19 DIAGNOSIS — F17210 Nicotine dependence, cigarettes, uncomplicated: Secondary | ICD-10-CM | POA: Diagnosis not present

## 2019-01-19 DIAGNOSIS — R531 Weakness: Secondary | ICD-10-CM | POA: Diagnosis not present

## 2019-01-19 DIAGNOSIS — Z79899 Other long term (current) drug therapy: Secondary | ICD-10-CM | POA: Insufficient documentation

## 2019-01-19 DIAGNOSIS — J449 Chronic obstructive pulmonary disease, unspecified: Secondary | ICD-10-CM | POA: Diagnosis not present

## 2019-01-19 DIAGNOSIS — R197 Diarrhea, unspecified: Secondary | ICD-10-CM | POA: Diagnosis not present

## 2019-01-19 DIAGNOSIS — I129 Hypertensive chronic kidney disease with stage 1 through stage 4 chronic kidney disease, or unspecified chronic kidney disease: Secondary | ICD-10-CM | POA: Insufficient documentation

## 2019-01-19 DIAGNOSIS — E1122 Type 2 diabetes mellitus with diabetic chronic kidney disease: Secondary | ICD-10-CM | POA: Diagnosis not present

## 2019-01-19 DIAGNOSIS — Z7901 Long term (current) use of anticoagulants: Secondary | ICD-10-CM | POA: Insufficient documentation

## 2019-01-19 DIAGNOSIS — R112 Nausea with vomiting, unspecified: Secondary | ICD-10-CM | POA: Diagnosis not present

## 2019-01-19 DIAGNOSIS — Z794 Long term (current) use of insulin: Secondary | ICD-10-CM | POA: Insufficient documentation

## 2019-01-19 LAB — CBC
HCT: 51.5 % — ABNORMAL HIGH (ref 36.0–46.0)
Hemoglobin: 16.3 g/dL — ABNORMAL HIGH (ref 12.0–15.0)
MCH: 29.6 pg (ref 26.0–34.0)
MCHC: 31.7 g/dL (ref 30.0–36.0)
MCV: 93.6 fL (ref 80.0–100.0)
Platelets: 199 10*3/uL (ref 150–400)
RBC: 5.5 MIL/uL — ABNORMAL HIGH (ref 3.87–5.11)
RDW: 13.8 % (ref 11.5–15.5)
WBC: 8.9 10*3/uL (ref 4.0–10.5)
nRBC: 0 % (ref 0.0–0.2)

## 2019-01-19 LAB — URINALYSIS, ROUTINE W REFLEX MICROSCOPIC
Bilirubin Urine: NEGATIVE
Glucose, UA: NEGATIVE mg/dL
Hgb urine dipstick: NEGATIVE
Ketones, ur: 20 mg/dL — AB
Leukocytes,Ua: NEGATIVE
Nitrite: NEGATIVE
Protein, ur: NEGATIVE mg/dL
Specific Gravity, Urine: 1.008 (ref 1.005–1.030)
pH: 7 (ref 5.0–8.0)

## 2019-01-19 LAB — COMPREHENSIVE METABOLIC PANEL
ALT: 15 U/L (ref 0–44)
AST: 28 U/L (ref 15–41)
Albumin: 4.8 g/dL (ref 3.5–5.0)
Alkaline Phosphatase: 91 U/L (ref 38–126)
Anion gap: 18 — ABNORMAL HIGH (ref 5–15)
BUN: 13 mg/dL (ref 8–23)
CO2: 24 mmol/L (ref 22–32)
Calcium: 10.7 mg/dL — ABNORMAL HIGH (ref 8.9–10.3)
Chloride: 92 mmol/L — ABNORMAL LOW (ref 98–111)
Creatinine, Ser: 1.33 mg/dL — ABNORMAL HIGH (ref 0.44–1.00)
GFR calc Af Amer: 42 mL/min — ABNORMAL LOW (ref >60)
GFR calc non Af Amer: 36 mL/min — ABNORMAL LOW (ref >60)
Glucose, Bld: 128 mg/dL — ABNORMAL HIGH (ref 70–99)
Potassium: 3.2 mmol/L — ABNORMAL LOW (ref 3.5–5.1)
Sodium: 134 mmol/L — ABNORMAL LOW (ref 135–145)
Total Bilirubin: 1 mg/dL (ref 0.3–1.2)
Total Protein: 8.3 g/dL — ABNORMAL HIGH (ref 6.5–8.1)

## 2019-01-19 LAB — LIPASE, BLOOD: Lipase: 35 U/L (ref 11–51)

## 2019-01-19 MED ORDER — SODIUM CHLORIDE 0.9 % IV BOLUS
1000.0000 mL | Freq: Once | INTRAVENOUS | Status: AC
  Administered 2019-01-19: 1000 mL via INTRAVENOUS

## 2019-01-19 MED ORDER — ONDANSETRON HCL 4 MG/2ML IJ SOLN
4.0000 mg | Freq: Once | INTRAMUSCULAR | Status: AC
  Administered 2019-01-19: 4 mg via INTRAVENOUS
  Filled 2019-01-19: qty 2

## 2019-01-19 MED ORDER — SODIUM CHLORIDE 0.9% FLUSH: 3.0000 mL | Freq: Once | INTRAVENOUS | Status: DC

## 2019-01-19 MED ORDER — ONDANSETRON 4 MG PO TBDP: 4.0000 mg | ORAL_TABLET | Freq: Three times a day (TID) | ORAL | 0 refills | Status: DC | PRN

## 2019-01-19 NOTE — ED Notes (Signed)
Gave pt water to drink

## 2019-01-19 NOTE — ED Notes (Signed)
Patient reports she is having nausea following drinking the water.

## 2019-01-19 NOTE — ED Notes (Signed)
Patient does not have to use the bathroom

## 2019-01-19 NOTE — ED Provider Notes (Signed)
[facility] DEPT Provider Note   CSN: [id number] Arrival date & time: 01/19/19  1010     History   Chief Complaint Chief Complaint  Patient presents with  . Emesis  . Diarrhea    HPI [name] is a 85 y.o. female.     HPI Patient with around 4-day history of diarrhea nausea and vomiting.  States she is no longer throwing up.  States it is just driving.  No fevers.  States no sick contacts.  States that a few weeks ago she was on antibiotics for her wound on her left elbow after fracture but not currently on them.  No abdominal pain.  States the diarrhea is dark but watery.  No abdominal pain.  States she feels somewhat weak all over.  She is on insulin but states she is missed her last dose because she was worried her sugar could drop. Past Medical History:  Diagnosis Date  . Ambulates with cane    straight  . Anxiety   . Arthritis    lower back  . CKD (chronic kidney disease), stage III ([place])   . Closed fracture of left olecranon process   . COPD (chronic obstructive pulmonary disease) ([place])   . Depression   . Diabetes mellitus without complication ([place])    type 2  . Headache   . Hypertension   . Orthostatic hypotension   . Osteopenia   . Pacemaker   . 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 Permanent atrial fibrillation    a.  s/p AVN ablation 2010 with complete heart block s/p PPM (gen change 2015, pt preference for Coumadin).  . Protein calorie malnutrition ([place])   . S/P AV nodal ablation   . TIA (transient ischemic attack)     Patient Active Problem List   Diagnosis Date Noted  . History of open reduction and internal fixation (ORIF) procedure 11/15/2018  . Wound dehiscence, surgical 11/04/2018  . Closed fracture of left olecranon process, initial encounter 09/20/2018  . Chronic anticoagulation 02/10/2014  . Pure hypercholesterolemia 02/10/2014  . Encounter for therapeutic drug  monitoring 06/15/2013  . Atrioventricular block, complete ([place]) 06/07/2013  . Pacemaker-Medtronic 06/07/2013  . Atrial fibrillation ([place]) 02/24/2013    Past Surgical History:  Procedure Laterality Date  . ABLATION  2012   AVN ablation by Dr Lovena Le  . cateracts Bilateral    eyes  . CYST REMOVAL TRUNK     from breast  . I&D EXTREMITY Left 11/04/2018   Procedure: IRRIGATION AND DEBRIDEMENT LEFT ELBOW, WOUND VAC;  Surgeon: Leandrew Koyanagi, MD;  Location: [facility];  Service: Orthopedics;  Laterality: Left;  . I&D EXTREMITY Left 11/15/2018   Procedure: IRRIGATION AND DEBRIDEMENT LEFT ELBOW, WOUND VAC;  Surgeon: Leandrew Koyanagi, MD;  Location: [facility];  Service: Orthopedics;  Laterality: Left;  . ORIF ELBOW FRACTURE Left 10/07/2018   Procedure: OPEN REDUCTION INTERNAL FIXATION (ORIF) LEFT OLECRANON FRACTURE;  Surgeon: Leandrew Koyanagi, MD;  Location: [facility];  Service: Orthopedics;  Laterality: Left;  . PACEMAKER INSERTION    . PERMANENT PACEMAKER GENERATOR CHANGE N/A 07/18/2013   Procedure: PERMANENT PACEMAKER GENERATOR CHANGE;  Surgeon: Deboraha Sprang, MD;  Location: [facility] CATH LAB;  Service: Cardiovascular;  Laterality: N/A;  . WISDOM TOOTH EXTRACTION       OB History   No obstetric history on file.      Home Medications    Prior to Admission  medications   Medication Sig Start Date End Date Taking? Authorizing Provider  calcium-vitamin D (OSCAL WITH D) 500-200 MG-UNIT tablet Take 1 tablet by mouth 3 (three) times daily. 10/07/18  Yes Leandrew Koyanagi, MD  escitalopram (LEXAPRO) 20 MG tablet Take 20 mg by mouth daily.  02/10/15  Yes [provider]  hydrochlorothiazide (HYDRODIURIL) 12.5 MG tablet TAKE 0.5 TABLETS (6.25 MG TOTAL) BY MOUTH DAILY. 10/27/18  Yes [name], MD  Insulin Glargine (TOUJEO SOLOSTAR) 300 UNIT/ML SOPN Inject 4 Units into the skin daily.    Yes [provider]  losartan (COZAAR) 50 MG tablet Take 100 mg by mouth daily. 12/01/18  Yes [provider]  magic mouthwash SOLN Take 5 mLs by mouth 3 (three) times daily.  03/08/18  Yes [provider]  Melatonin 5 MG TABS Take 5 mg by mouth at bedtime.    Yes [provider]  mirtazapine (REMERON) 15 MG tablet Take 15 mg by mouth at bedtime.   Yes [provider]  Multiple Vitamin (MULTIVITAMIN) tablet Take 1 tablet by mouth daily.   Yes [provider]  pregabalin (LYRICA) 50 MG capsule Take 1 capsule (50 mg total) by mouth daily. Patient taking differently: Take 50 mg by mouth at bedtime.  10/18/18  Yes [name], DPM  PREVIDENT 5000 DRY MOUTH 1.1 % GEL dental gel Take 1 application by mouth See admin instructions. Use to brush teeth every day, replacement for regular toothpaste 08/23/18  Yes [provider]  Rivaroxaban (XARELTO) 15 MG TABS tablet Take 1 tablet (15 mg total) by mouth daily with supper. 09/29/18  Yes Deboraha Sprang, MD  BD PEN NEEDLE NANO U/F 32G X 4 MM MISC AS DIRECTED USE WITH TOUJEO/ DX E11.49 IN VITRO 01/21/18   [provider]  cephALEXin (KEFLEX) 250 MG capsule Take 1 capsule (250 mg total) by mouth 2 (two) times daily. Patient not taking: Reported on 01/19/2019 11/16/18   Aundra Dubin, PA-C  cephALEXin (KEFLEX) 250 MG capsule Take 1 capsule (250 mg total) by mouth 2 (two) times daily. Patient not taking: Reported on 01/19/2019 11/26/18   Aundra Dubin, PA-C  HYDROcodone-acetaminophen [facility]) 5-325 MG tablet Take 1/2-1 tab po bid prn pain Patient not taking: Reported on 01/19/2019 11/16/18   Aundra Dubin, PA-C  ondansetron (ZOFRAN-ODT) 4 MG disintegrating tablet Take 1 tablet (4 mg total) by mouth every 8 (eight) hours as needed for nausea or vomiting. 01/19/19   Davonna Belling, MD  promethazine (PHENERGAN) 12.5 MG tablet Take 1 tablet (12.5 mg total) by mouth 3 (three) times daily as needed for nausea or vomiting. Patient not taking: Reported on 01/19/2019 11/02/18   Aundra Dubin, PA-C    Family  History Family History  Problem Relation Age of Onset  . Stroke Father     Social History Social History   Tobacco Use  . Smoking status: Current Every Day Smoker    Packs/day: 0.50    Years: 67.00    Pack years: 33.50    Types: Cigarettes  . Smokeless tobacco: Never Used  Substance Use Topics  . Alcohol use: No    Alcohol/week: 0.0 standard drinks  . Drug use: No     Allergies   Eliquis [apixaban], Gabapentin, Rozerem [ramelteon], Tikosyn [dofetilide], Tradjenta [linagliptin], and Temazepam   Review of Systems Review of Systems  Constitutional: Positive for appetite change. Negative for fever.  HENT: Negative for congestion.   Respiratory: Negative for shortness of  breath.   Cardiovascular: Negative for chest pain.  Gastrointestinal: Positive for diarrhea, nausea and vomiting. Negative for abdominal pain.  Genitourinary: Negative for flank pain.  Musculoskeletal: Negative for back pain.  Skin: Negative for rash.  Neurological: Negative for weakness.  Psychiatric/Behavioral: Negative for confusion.     Physical Exam Updated Vital Signs BP (!) 161/96   Pulse 79   Temp 97.8 F (36.6 C) (Oral)   Resp 18   Ht 5\' 3"  (1.6 m)   Wt 45.4 kg   LMP  (LMP Unknown)   SpO2 95%   BMI 17.71 kg/m   Physical Exam Vitals signs and nursing note reviewed.  HENT:     Head: Normocephalic.  Eyes:     Extraocular Movements: Extraocular movements intact.  Neck:     Musculoskeletal: Neck supple.  Cardiovascular:     Rate and Rhythm: Normal rate and regular rhythm.  Pulmonary:     Breath sounds: No wheezing, rhonchi or rales.  Abdominal:     Tenderness: There is no abdominal tenderness. There is no guarding.     Hernia: No hernia is present.  Musculoskeletal:     Right lower leg: No edema.     Left lower leg: No edema.  Skin:    General: Skin is warm.     Capillary Refill: Capillary refill takes less than 2 seconds.  Neurological:     Mental Status: She is alert.  Mental status is at baseline.      ED Treatments / Results  Labs (all labs ordered are listed, but only abnormal results are displayed) Labs Reviewed  COMPREHENSIVE METABOLIC PANEL - Abnormal; Notable for the following components:      Result Value   Sodium 134 (*)    Potassium 3.2 (*)    Chloride 92 (*)    Glucose, Bld 128 (*)    Creatinine, Ser 1.33 (*)    Calcium 10.7 (*)    Total Protein 8.3 (*)    GFR calc non Af Amer 36 (*)    GFR calc Af Amer 42 (*)    Anion gap 18 (*)    All other components within normal limits  CBC - Abnormal; Notable for the following components:   RBC 5.50 (*)    Hemoglobin 16.3 (*)    HCT 51.5 (*)    All other components within normal limits  URINALYSIS, ROUTINE W REFLEX MICROSCOPIC - Abnormal; Notable for the following components:   Ketones, ur 20 (*)    All other components within normal limits  C DIFFICILE QUICK SCREEN W PCR REFLEX  LIPASE, BLOOD    EKG None  Radiology No results found.  Procedures Procedures (including critical care time)  Medications Ordered in ED Medications  sodium chloride flush (NS) 0.9 % injection 3 mL (3 mLs Intravenous Not Given 01/19/19 1512)  sodium chloride 0.9 % bolus 1,000 mL (1,000 mLs Intravenous New Bag/Given 01/19/19 1510)  ondansetron (ZOFRAN) injection 4 mg (4 mg Intravenous Given 01/19/19 1509)     Initial Impression / Assessment and Plan / ED Course  I have reviewed the triage vital signs and the nursing notes.  Pertinent labs & imaging results that were available during my care of the patient were reviewed by me and considered in my medical decision making (see chart for details).        Patient with nausea vomiting and diarrhea.  Has not vomited since being here.  States he feels much better after the fluid.  Tolerated some  fluids although she has some nausea.  Will discharge home with antiemetic.  Had ordered a C. difficile but had not had any more diarrhea while here.  Follow-up with  PCP  Final Clinical Impressions(s) / ED Diagnoses   Final diagnoses:  Nausea vomiting and diarrhea    ED Discharge Orders         Ordered    ondansetron (ZOFRAN-ODT) 4 MG disintegrating tablet  Every 8 hours PRN     01/19/19 1750           Davonna Belling, MD 01/19/19 1751

## 2019-01-19 NOTE — ED Triage Notes (Signed)
Pt presents with c/o vomiting and diarrhea since Sunday. Pt was seen yesterday in triage and decided to leave because the wait was too long. Pt reports she is unable to keep anything down when eating.

## 2019-01-21 ENCOUNTER — Other Ambulatory Visit: Payer: Self-pay

## 2019-01-21 ENCOUNTER — Telehealth: Payer: Self-pay | Admitting: Orthopaedic Surgery

## 2019-01-21 DIAGNOSIS — R6889 Other general symptoms and signs: Secondary | ICD-10-CM | POA: Diagnosis not present

## 2019-01-21 DIAGNOSIS — F339 Major depressive disorder, recurrent, unspecified: Secondary | ICD-10-CM | POA: Diagnosis not present

## 2019-01-21 DIAGNOSIS — F411 Generalized anxiety disorder: Secondary | ICD-10-CM | POA: Diagnosis not present

## 2019-01-21 DIAGNOSIS — Z20822 Contact with and (suspected) exposure to covid-19: Secondary | ICD-10-CM

## 2019-01-21 DIAGNOSIS — R110 Nausea: Secondary | ICD-10-CM | POA: Diagnosis not present

## 2019-01-21 DIAGNOSIS — M25522 Pain in left elbow: Secondary | ICD-10-CM | POA: Diagnosis not present

## 2019-01-21 NOTE — Telephone Encounter (Signed)
I will take care of the [facility] order but pt is asking for a refill on her tramadol and states that insurance company will not cover her zofran and she was in the ER for nausea and vomiting.

## 2019-01-21 NOTE — Telephone Encounter (Signed)
I called and advised that yes the pt at last visit was advised to continue with [facility] for wound care of her elbow and to have OT work on mobilization. Voiced understanding and will get this included in pts care.

## 2019-01-21 NOTE — Telephone Encounter (Signed)
Sorry. The number for [name] is [phone]

## 2019-01-21 NOTE — Telephone Encounter (Signed)
[name] from [facility] called. She is the home health nurse working with [name]. She would like to clarify rather or not the patient should be doing outpatient rehab or home OT. Please call her at [phone]. Thank you.

## 2019-01-21 NOTE — Telephone Encounter (Signed)
Incorrect number in message I will contact Sonia Side with [facility] to advise pt is s/p a left elbow I&D 11/15/18 and should receive in home OT for ROM

## 2019-01-21 NOTE — Telephone Encounter (Signed)
Patient called advised she spoke with [facility] and she was told the order for wound care  Will run out 01/27/2019. Patient asked if the order can be extended until she see Dr Erlinda Hong in October. Patient also need Rx refilled Tramadol. Patient said she has had nausea and diarrhea and had to go to ER at [facility] Wednesday. Patient said the insurance company will not pay for her nausea medicine so she did not get it filled. Patria Mane) The number to contact patient is [phone]

## 2019-01-23 LAB — NOVEL CORONAVIRUS, NAA: SARS-CoV-2, NAA: NOT DETECTED

## 2019-01-25 ENCOUNTER — Other Ambulatory Visit: Payer: Self-pay | Admitting: Physician Assistant

## 2019-01-25 MED ORDER — PROMETHAZINE HCL 12.5 MG PO TABS: 12.5000 mg | ORAL_TABLET | Freq: Three times a day (TID) | ORAL | 0 refills | Status: DC | PRN

## 2019-01-25 MED ORDER — TRAMADOL HCL 50 MG PO TABS: 50.0000 mg | ORAL_TABLET | Freq: Three times a day (TID) | ORAL | 1 refills | Status: DC | PRN

## 2019-01-25 NOTE — Telephone Encounter (Signed)
Ok to refill tramadol.  Can you see if she tolerates phenergan?

## 2019-01-25 NOTE — Telephone Encounter (Signed)
[facility] confirms that they will extend [facility] for wound care and will also add OT.

## 2019-01-25 NOTE — Telephone Encounter (Signed)
I sent in.  Let me know if you have any issues with the wound care

## 2019-01-25 NOTE — Telephone Encounter (Signed)
I called pt and she can take phenergan she has taken in the past. Can you enter rx for this and tramadol electronically. Pt uses the [facility] on file. I also have a message in to [facility] to check on extension of services for wound care of her elbow. Thanks!

## 2019-01-26 ENCOUNTER — Telehealth: Payer: Self-pay | Admitting: Orthopaedic Surgery

## 2019-01-26 NOTE — Telephone Encounter (Signed)
Please see message regarding home health therapy. Can you clarify orders please?

## 2019-01-26 NOTE — Telephone Encounter (Signed)
I called [name] and advised. OK for orders. Per Dr. Erlinda Hong, as aggressive ROM of the elbow that the patient can tolerate. He does not expect patient will reach full range of motion. D/C sling. Jim aware.

## 2019-01-26 NOTE — Telephone Encounter (Signed)
Costella Hatcher, OT, [facility] called to requesting VO for the following for [facility] OT:  1x a week for 1 week 3x a week for 2 weeks 1x a week for 1 week.  ROM is affected on the left side.  He wanted to know what you want him to do with the elbow because her ROM is in the negative. Also, he wanted to know if she needs to continue wearing the sling.  CB#[phone].  Thank you.

## 2019-01-26 NOTE — Telephone Encounter (Signed)
yes

## 2019-01-28 NOTE — Progress Notes (Signed)
CARDIOLOGY OFFICE NOTE  Date:  02/01/2019    [name] Date of Birth: 05/18/1929 Medical Record [id number]  PCP:  Antony Contras, MD  Cardiologist:  Servando Snare Sk[place]/K[place]   Chief Complaint  Patient presents with   Follow-up    History of Present Illness: [name] is a 85 y.o. female who presents today for a 6 month check. Seen for Dr. Marlou Porch & Caryl Comes.   She has a history of permanent atrial fibrillation, status post AV nodal ablation in 2010 for persistent symptoms with underlying PPM. Other issues include DM, chronic systolic HF with  [id number] likely from chronic pacing, septal wall hypokinesis noted a nuclear stress test which actually showed an EF of 64%. She has had ongoing tobacco abuse. She has been on coumadin for her anticoagulation. She has not tolerated Eliquis in the past.   Last seen by Dr. Caryl Comes back in March. She was going to transition from coumadin to her Xarelto 15 mg at her next INR. I last saw in March as well. Continues to smoke. Balance getting worse. Cardiac status seemed stable.   She was in the ER earlier this month with a several day history of N, V and diarrhea. She was treated with IVFs.   The patient does not have symptoms concerning for COVID-19 infection (fever, chills, cough, or new shortness of breath).   Comes in today. Here alone. Seems to be holding her own. Continues to smoke. No intent on stopping. Her weight is down. She is on Xarelto 15 mg - asking about getting INR checked. Labs from earlier this month noted. Potassium low. She had a fall back in April - broke her elbow - has had 3 surgeries and now getting PT. Some residual swelling in the left hand but she says this is improving. The fall occurred from missing the curb while out to get the mail. Her GI issue is resolved. She is aware she has lost weight - her clothes fit "differently". She attributes this to the GI illness she had. She does have an appetite. She tries to watch  her sweets. No cough. No fever. No night sweats.   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  s/p AVN ablation 2010 with complete heart block s/p PPM (gen change 2015, pt preference for Coumadin).   Protein calorie malnutrition (HCC)    S/P AV nodal ablation    TIA (transient ischemic attack)     Past Surgical History:  Procedure Laterality Date   ABLATION  2012   AVN ablation by Dr Lovena Le   cateracts Bilateral    eyes   CYST REMOVAL TRUNK     from breast   I&D EXTREMITY Left 11/04/2018   Procedure: IRRIGATION AND DEBRIDEMENT LEFT ELBOW, WOUND VAC;  Surgeon: Leandrew Koyanagi, MD;  Location: [facility];  Service: Orthopedics;  Laterality:  Left;   I&D EXTREMITY Left 11/15/2018   Procedure: IRRIGATION AND DEBRIDEMENT LEFT ELBOW, WOUND VAC;  Surgeon: Leandrew Koyanagi, MD;  Location: [facility];  Service: Orthopedics;  Laterality: Left;   ORIF ELBOW FRACTURE Left 10/07/2018   Procedure: OPEN REDUCTION INTERNAL FIXATION (ORIF) LEFT OLECRANON FRACTURE;  Surgeon: Leandrew Koyanagi, MD;  Location: [facility];  Service: Orthopedics;  Laterality: Left;   PACEMAKER INSERTION     PERMANENT PACEMAKER GENERATOR CHANGE N/A 07/18/2013   Procedure: PERMANENT PACEMAKER GENERATOR CHANGE;  Surgeon: Deboraha Sprang, MD;  Location[facility] C[facility] LAB;  Service: Cardiovascular;  Laterality: N/A;   WISDOM TOOTH EXTRACTION       Medications: Current Meds  Medication Sig   BD PEN NEEDLE NANO U/F 32G X 4  MM MISC AS DIRECTED USE WITH TOUJEO/ DX E11.49 IN VITRO   calcium-vitamin D (OSCAL WITH D) 500-200 MG-UNIT tablet Take 1 tablet by mouth 3 (three) times daily.   escitalopram (LEXAPRO) 20 MG tablet Take 20 mg by mouth daily.    Insulin Glargine (TOUJEO SOLOSTAR) 300 UNIT/ML SOPN Inject 4 Units into the skin daily.    losartan (COZAAR) 50 MG tablet Take 100 mg by mouth daily.   magic mouthwash SOLN Take 5 mLs by mouth 3 (three) times daily.    Melatonin 5 MG TABS Take 5 mg by mouth at bedtime.    mirtazapine (REMERON) 15 MG tablet Take 15 mg by mouth at bedtime.   Multiple Vitamin (MULTIVITAMIN) tablet Take 1 tablet by mouth daily.   pregabalin (LYRICA) 50 MG capsule Take 1 capsule (50 mg total) by mouth daily. (Patient taking differently: Take 50 mg by mouth at bedtime. )   PREVIDENT 5000 DRY MOUTH 1.1 % GEL dental gel Take 1 application by mouth See admin instructions. Use to brush teeth every day, replacement for regular toothpaste   Rivaroxaban (XARELTO) 15 MG TABS tablet Take 1 tablet (15 mg total) by mouth daily with supper.   traMADol (ULTRAM) 50 MG tablet Take 1 tablet (50 mg total) by mouth 3 (three) times daily as needed.   [DISCONTINUED] hydrochlorothiazide (HYDRODIURIL) 12.5 MG tablet TAKE 0.5 TABLETS (6.25 MG TOTAL) BY MOUTH DAILY.   [DISCONTINUED] promethazine (PHENERGAN) 12.5 MG tablet Take 1 tablet (12.5 mg total) by mouth 3 (three) times daily as needed for nausea or vomiting.     Allergies: Allergies  Allergen Reactions   Eliquis [Apixaban] Other (See Comments)    Made pt have weak muscles   Gabapentin Nausea Only and Other (See Comments)    SHAKES AND NAUSEA    Rozerem [Ramelteon] Other (See Comments)    DIZZY AND HEAD THROBBING   Tikosyn [Dofetilide] Itching and Swelling    Mouth swells, itching   Tradjenta [Linagliptin] Diarrhea and Other (See Comments)    Joint Pain, Stomach issues   Temazepam Other (See Comments)    INSOMNIA    Social  History: The patient  reports that she has been smoking cigarettes. She has a 33.50 pack-year smoking history. She has never used smokeless tobacco. She reports that she does not drink alcohol or use drugs.   Family History: The patient's family history includes Stroke in her father.   Review of Systems: Please see the history of present illness.   All other systems are reviewed and negative.   Physical Exam: VS:  BP 108/70 (BP Location: Left Arm, Patient Position: Sitting, Cuff Size: Small)    Pulse 83    Ht 5\' 3"  (1.6  m)    Wt 89 lb 12.8 oz (40.7 kg)    LMP  (LMP Unknown)    SpO2 99% Comment: at rest   BMI 15.91 kg/m  .  BMI Body mass index is 15.91 kg/m.  Wt Readings from Last 3 Encounters:  02/01/19 89 lb 12.8 oz (40.7 kg)  01/19/19 100 lb (45.4 kg)  12/21/18 100 lb (45.4 kg)   BP was taken with the peds cuff today.   General: Elderly. Alert and in no acute distress. Weight is down further.   HEENT: Normal.  Neck: Supple, no JVD, carotid bruits, or masses noted.  Cardiac: Heart tones are quite distant. No edema in her feet. Mild swelling of the left hand - dressing in place over left elbow.  Respiratory:  Lungs are clear to auscultation bilaterally with normal work of breathing.  GI: Soft and nontender.  MS: No deformity or atrophy. Gait and ROM intact.  Skin: Warm and dry. Color is sallow. Neuro:  Strength and sensation are intact and no gross focal deficits noted.  Psych: Alert, appropriate and with normal affect.   LABORATORY DATA:  EKG:  EKG is not ordered today.  Lab Results  Component Value Date   WBC 8.9 01/19/2019   HGB 16.3 (H) 01/19/2019   HCT 51.5 (H) 01/19/2019   PLT 199 01/19/2019   GLUCOSE 128 (H) 01/19/2019   CHOL  07/08/2010    89        ATP III CLASSIFICATION:  <200     mg/dL   Desirable  200-239  mg/dL   Borderline High  >=240    mg/dL   High          TRIG 99 07/08/2010   HDL 35 (L) 07/08/2010   LDLCALC  07/08/2010    34        Total  Cholesterol/HDL:CHD Risk Coronary Heart Disease Risk Table                     Men   Women  1/2 Average Risk   3.4   3.3  Average Risk       5.0   4.4  2 X Average Risk   9.6   7.1  3 X Average Risk  23.4   11.0        Use the calculated Patient Ratio above and the CHD Risk Table to determine the patient's CHD Risk.        ATP III CLASSIFICATION (LDL):  <100     mg/dL   Optimal  100-129  mg/dL   Near or Above                    Optimal  130-159  mg/dL   Borderline  160-189  mg/dL   High  >190     mg/dL   Very High   ALT 15 01/19/2019   AST 28 01/19/2019   NA 134 (L) 01/19/2019   K 3.2 (L) 01/19/2019   CL 92 (L) 01/19/2019   CREATININE 1.33 (H) 01/19/2019   BUN 13 01/19/2019   CO2 24 01/19/2019   TSH 2.50 10/25/2013   INR 1.1 11/15/2018   HGBA1C 7.2 (H) 10/21/2018       BNP (last 3 results) No results for input(s): BNP in the last 8760 hours.  ProBNP (last 3 results) No results for input(s): PROBNP in the last 8760 hours.   Other Studies Reviewed Today:  Echo Study Conclusions 01/2017  -  Left ventricle: The cavity size was normal. Systolic function was normal. The estimated ejection fraction was in the range of 60% to 65%. Wall motion was normal; there were no regional wall motion abnormalities. The study is not technically sufficient to allow evaluation of LV diastolic function. - Aortic valve: Transvalvular velocity was within the normal range. There was no stenosis. There was no regurgitation. - Mitral valve: Calcified annulus. Transvalvular velocity was within the normal range. There was no evidence for stenosis. There was mild regurgitation. - Left atrium: The atrium was mildly dilated. - Right ventricle: The cavity size was normal. Wall thickness was normal. Systolic function was normal. - Right atrium: The atrium was severely dilated. - Atrial septum: No defect or patent foramen ovale was identified. - Tricuspid valve: There was mild  regurgitation. - Pulmonary arteries: Systolic pressure was within the normal range. PA peak pressure: 25 mm Hg (S).   Assessment/Plan:  1. Persistent AF - CHADSVASC of at least 5 - did not like Eliquis - was on Coumadin and has been transitioned over to Xarelto - she is doing ok with this. Recheck her lab today. Has underlying PPM. No changes made today.   2. HTN - BP lower - using peds cuff - stopping HCTZ today - she is not sure if she is really getting it - she has to break a 25 in half and then break in half again - will stop for now.   3. DM - per PCP  4. Underlying PPM - prior AV nodal ablation - followed by EP.   5. Ongoing tobacco abuse - she is not going to stop.   6. Worsening protein calorie malnutrition - Bp now lower. BMET and CBC today.   7. COVID-19 Education: The signs and symptoms of COVID-19 were discussed with the patient and how to seek care for testing (follow up with PCP or arrange E-visit).  The importance of social distancing, staying at home, hand hygiene and wearing a mask when out in public were discussed today.  Current medicines are reviewed with the patient today.  The patient does not have concerns regarding medicines other than what has been noted above.  The following changes have been made:  See above.  Labs/ tests ordered today include:    Orders Placed This Encounter  Procedures   Basic metabolic panel   CBC     Disposition:   FU with [place] in 6 months.  Patient is agreeable to this plan and will call if any problems develop in the interim.   SignedTruitt Merle, NP  02/01/2019 2:33 PM  [facility] [address] [place], [place]  [address] Phone: [phone] Fax: [phone]

## 2019-02-01 ENCOUNTER — Ambulatory Visit (INDEPENDENT_AMBULATORY_CARE_PROVIDER_SITE_OTHER): Payer: Medicare Other | Admitting: Nurse Practitioner

## 2019-02-01 ENCOUNTER — Encounter: Payer: Self-pay | Admitting: Nurse Practitioner

## 2019-02-01 ENCOUNTER — Other Ambulatory Visit: Payer: Self-pay

## 2019-02-01 VITALS — BP 108/70 | HR 83 | Ht 63.0 in | Wt 89.8 lb

## 2019-02-01 DIAGNOSIS — Z7901 Long term (current) use of anticoagulants: Secondary | ICD-10-CM

## 2019-02-01 DIAGNOSIS — I4821 Permanent atrial fibrillation: Secondary | ICD-10-CM

## 2019-02-01 DIAGNOSIS — Z950 Presence of cardiac pacemaker: Secondary | ICD-10-CM | POA: Diagnosis not present

## 2019-02-01 DIAGNOSIS — Z7189 Other specified counseling: Secondary | ICD-10-CM | POA: Diagnosis not present

## 2019-02-01 DIAGNOSIS — I10 Essential (primary) hypertension: Secondary | ICD-10-CM

## 2019-02-01 NOTE — Patient Instructions (Addendum)
After Visit Summary:  We will be checking the following labs today - BMET and CBC   Medication Instructions:    Continue with your current medicines. BUT  STOP the HCTZ   If you need a refill on your cardiac medications before your next appointment, please call your pharmacy.     Testing/Procedures To Be Arranged:  N/A  Follow-Up:   See Dr. Marlou Porch in 6 months    At [facility], you and your health needs are our priority.  As part of our continuing mission to provide you with exceptional heart care, we have created designated Provider Care Teams.  These Care Teams include your primary Cardiologist (physician) and Advanced Practice Providers (APPs -  Physician Assistants and Nurse Practitioners) who all work together to provide you with the care you need, when you need it.  Special Instructions:  . Stay safe, stay home, wash your hands for at least 20 seconds and wear a mask when out in public.  . It was good to talk with you today. . Use a pediatric cuff when you have your BP checked    Call the [place] office at [phone] if you have any questions, problems or concerns.

## 2019-02-02 LAB — BASIC METABOLIC PANEL
BUN/Creatinine Ratio: 11 — ABNORMAL LOW (ref 12–28)
BUN: 17 mg/dL (ref 8–27)
CO2: 28 mmol/L (ref 20–29)
Calcium: 10.3 mg/dL (ref 8.7–10.3)
Chloride: 95 mmol/L — ABNORMAL LOW (ref 96–106)
Creatinine, Ser: 1.59 mg/dL — ABNORMAL HIGH (ref 0.57–1.00)
GFR calc Af Amer: 33 mL/min/{1.73_m2} — ABNORMAL LOW (ref >59)
GFR calc non Af Amer: 29 mL/min/{1.73_m2} — ABNORMAL LOW (ref >59)
Glucose: 178 mg/dL — ABNORMAL HIGH (ref 65–99)
Potassium: 3.9 mmol/L (ref 3.5–5.2)
Sodium: 138 mmol/L (ref 134–144)

## 2019-02-02 LAB — CBC
Hematocrit: 43.3 % (ref 34.0–46.6)
Hemoglobin: 13.9 g/dL (ref 11.1–15.9)
MCH: 29.8 pg (ref 26.6–33.0)
MCHC: 32.1 g/dL (ref 31.5–35.7)
MCV: 93 fL (ref 79–97)
Platelets: 211 10*3/uL (ref 150–450)
RBC: 4.66 x10E6/uL (ref 3.77–5.28)
RDW: 13.5 % (ref 11.7–15.4)
WBC: 9.3 10*3/uL (ref 3.4–10.8)

## 2019-02-09 ENCOUNTER — Telehealth: Payer: Self-pay | Admitting: Cardiology

## 2019-02-09 NOTE — Telephone Encounter (Signed)
I spoke with pt. She reports she was taking half of a 25 mg tablet of HCTZ but this was stopped at recent office visit. She reports BP has been elevated recently and she has had a headache every afternoon for the last 5 days.  Eventually goes away in the evening after she eats.  Has tried tylenol but she doesn't think this has helped headache. Has constant nasal drip but no other complaints.    She cannot check BP by herself due to broken arm.  She reports BP had been elevated when seen by nurse and therapist since medication stopped.  States top number was 159 one time but no other readings available.  I asked her to have nurse and therapist write readings down next time they were there. While speaking with me the therapist arrived.  Pt will ask therapist to check her BP during visit.

## 2019-02-09 NOTE — Telephone Encounter (Signed)
She would need to have a pediatric cuff due to her very small arm size.   She was previously just on 6.25 mg of HCTZ.   Agree with them trying to continue to monitor and let me know.   Thanks Cecille Rubin

## 2019-02-09 NOTE — Telephone Encounter (Signed)
New Message    Pt c/o medication issue:  1. Name of Medication: Hydrochlorothiazide 12.5mg   2. How are you currently taking this medication (dosage and times per day)? 1/2 tablet once a day   3. Are you having a reaction (difficulty breathing--STAT)? No  4. What is your medication issue? Patient states she was taken off the medication but has been having headaches and BP has been going up.  Patient doesn't have record of BP's, but thinks she should start taking the medication again.

## 2019-02-09 NOTE — Telephone Encounter (Signed)
I spoke with pt.  She reports therapist used wrist cuff today and reading was 149/92.  Pt reports nurse is coming on Friday and I asked pt to make a note to check with nurse about using pediatric cuff.  I asked her to call back next week with updated readings.

## 2019-02-17 DIAGNOSIS — G4700 Insomnia, unspecified: Secondary | ICD-10-CM | POA: Diagnosis not present

## 2019-02-22 ENCOUNTER — Ambulatory Visit (INDEPENDENT_AMBULATORY_CARE_PROVIDER_SITE_OTHER): Payer: Medicare Other | Admitting: Orthopaedic Surgery

## 2019-02-22 ENCOUNTER — Encounter: Payer: Self-pay | Admitting: Orthopaedic Surgery

## 2019-02-22 VITALS — Ht 63.0 in | Wt 89.0 lb

## 2019-02-22 DIAGNOSIS — T8131XS Disruption of external operation (surgical) wound, not elsewhere classified, sequela: Secondary | ICD-10-CM

## 2019-02-22 NOTE — Progress Notes (Signed)
Office Visit Note   Patient: [name]           Date of Birth: 1928-09-19           MRN: [id number] Visit Date: 02/22/2019              Requested by: Antony Contras, MD [place],  [place] [address] PCP: Antony Contras, MD   Assessment & Plan: Visit Diagnoses:  1. Postoperative wound dehiscence, sequela     Plan: At this point she has healed her wound.  I would like her to continue with PT until she plateaus and is discharged by them.  From my standpoint we can see her back as needed.  Follow-Up Instructions: Return if symptoms worsen or fail to improve.   Orders:  No orders of the defined types were placed in this encounter.  No orders of the defined types were placed in this encounter.     Procedures: No procedures performed   Clinical Data: No additional findings.   Subjective: Chief Complaint  Patient presents with  . Left Elbow - Follow-up    10/07/2018 ORIF Left Olecranon Fracture    [name] returns today for follow-up of her left elbow.  She is doing very well and has no complaints.  She is nearly complete with PT.   Review of Systems   Objective: Vital Signs: Ht 5\' 3"  (1.6 m)   Wt 89 lb (40.4 kg)   LMP  (LMP Unknown)   BMI 15.77 kg/m   Physical Exam  Ortho Exam Left elbow shows a dry scab on the posterior aspect.  There is no open wounds.  Range of motion is 55 degrees to 110 degrees. Specialty Comments:  No specialty comments available.  Imaging: No results found.   PMFS History: Patient Active Problem List   Diagnosis Date Noted  . History of open reduction and internal fixation (ORIF) procedure 11/15/2018  . Wound dehiscence, surgical 11/04/2018  . Closed fracture of left olecranon process, initial encounter 09/20/2018  . Chronic anticoagulation 02/10/2014  . Pure hypercholesterolemia 02/10/2014  . Encounter for therapeutic drug monitoring 06/15/2013  . Atrioventricular block, complete ([place]) 06/07/2013   . Pacemaker-Medtronic 06/07/2013  . Atrial fibrillation ([place]) 02/24/2013   Past Medical History:  Diagnosis Date  . Ambulates with cane    straight  . Anxiety   . Arthritis    lower back  . CKD (chronic kidney disease), stage III   . Closed fracture of left olecranon process   . COPD (chronic obstructive pulmonary disease) ([place])   . Depression   . Diabetes mellitus without complication ([place])    type 2  . Headache   . Hypertension   . Orthostatic hypotension   . Osteopenia   . Pacemaker   . 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 Permanent atrial fibrillation ([place])    a.  s/p AVN ablation 2010 with complete heart block s/p PPM (gen change 2015, pt preference for Coumadin).  . Protein calorie malnutrition ([place])   . S/P AV nodal ablation   . TIA (transient ischemic attack)     Family History  Problem Relation Age of Onset  . Stroke Father     Past Surgical History:  Procedure Laterality Date  . ABLATION  2012   AVN ablation by Dr Lovena Le  . cateracts Bilateral    eyes  . CYST REMOVAL TRUNK     from breast  .  I&D EXTREMITY Left 11/04/2018   Procedure: IRRIGATION AND DEBRIDEMENT LEFT ELBOW, WOUND VAC;  Surgeon: Leandrew Koyanagi, MD;  Location: [place];  Service: Orthopedics;  Laterality: Left;  . I&D EXTREMITY Left 11/15/2018   Procedure: IRRIGATION AND DEBRIDEMENT LEFT ELBOW, WOUND VAC;  Surgeon: Leandrew Koyanagi, MD;  Location: [facility];  Service: Orthopedics;  Laterality: Left;  . ORIF ELBOW FRACTURE Left 10/07/2018   Procedure: OPEN REDUCTION INTERNAL FIXATION (ORIF) LEFT OLECRANON FRACTURE;  Surgeon: Leandrew Koyanagi, MD;  Location: [facility];  Service: Orthopedics;  Laterality: Left;  . PACEMAKER INSERTION    . PERMANENT PACEMAKER GENERATOR CHANGE N/A 07/18/2013   Procedure: PERMANENT PACEMAKER GENERATOR CHANGE;  Surgeon: Deboraha Sprang, MD;  Location: [facility] CATH LAB;  Service: Cardiovascular;  Laterality: N/A;  . 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 Smoking status: Current Every Day Smoker    Packs/day: 0.50    Years: 67.00    Pack years: 33.50    Types: Cigarettes  . Smokeless tobacco: Never Used  Substance and Sexual Activity  . Alcohol use: No    Alcohol/week: 0.0 standard drinks  . Drug use: No  . Sexual activity: Not on file

## 2019-02-24 ENCOUNTER — Ambulatory Visit (INDEPENDENT_AMBULATORY_CARE_PROVIDER_SITE_OTHER): Payer: Medicare Other | Admitting: *Deleted

## 2019-02-24 DIAGNOSIS — I442 Atrioventricular block, complete: Secondary | ICD-10-CM

## 2019-02-24 LAB — CUP PACEART REMOTE DEVICE CHECK
Battery Impedance: 579 Ohm
Battery Remaining Longevity: 80 mo
Battery Voltage: 2.78 V
Brady Statistic RV Percent Paced: 100 %
Date Time Interrogation Session: 20201008102205
Implantable Lead Implant Date: 20080416
Implantable Lead Implant Date: 20080416
Implantable Lead Location: 753859
Implantable Lead Location: 753860
Implantable Lead Model: 4076
Implantable Lead Model: 5076
Implantable Pulse Generator Implant Date: 20150302
Lead Channel Impedance Value: 466 Ohm
Lead Channel Pacing Threshold Amplitude: 0.5 V
Lead Channel Pacing Threshold Pulse Width: 0.4 ms
Lead Channel Setting Pacing Amplitude: 2.5 V
Lead Channel Setting Pacing Pulse Width: 0.4 ms
Lead Channel Setting Sensing Sensitivity: 5.6 mV

## 2019-02-25 ENCOUNTER — Encounter: Payer: Self-pay | Admitting: Cardiology

## 2019-02-25 NOTE — Progress Notes (Signed)
Remote pacemaker transmission.   

## 2019-03-01 DIAGNOSIS — I442 Atrioventricular block, complete: Secondary | ICD-10-CM | POA: Diagnosis not present

## 2019-03-01 DIAGNOSIS — I129 Hypertensive chronic kidney disease with stage 1 through stage 4 chronic kidney disease, or unspecified chronic kidney disease: Secondary | ICD-10-CM | POA: Diagnosis not present

## 2019-03-01 DIAGNOSIS — E1122 Type 2 diabetes mellitus with diabetic chronic kidney disease: Secondary | ICD-10-CM | POA: Diagnosis not present

## 2019-03-01 DIAGNOSIS — T8133XA Disruption of traumatic injury wound repair, initial encounter: Secondary | ICD-10-CM | POA: Diagnosis not present

## 2019-03-01 DIAGNOSIS — F17210 Nicotine dependence, cigarettes, uncomplicated: Secondary | ICD-10-CM | POA: Diagnosis not present

## 2019-03-01 DIAGNOSIS — N183 Chronic kidney disease, stage 3 unspecified: Secondary | ICD-10-CM | POA: Diagnosis not present

## 2019-03-01 DIAGNOSIS — S52022A Displaced fracture of olecranon process without intraarticular extension of left ulna, initial encounter for closed fracture: Secondary | ICD-10-CM | POA: Diagnosis not present

## 2019-03-01 DIAGNOSIS — I951 Orthostatic hypotension: Secondary | ICD-10-CM | POA: Diagnosis not present

## 2019-03-01 DIAGNOSIS — E46 Unspecified protein-calorie malnutrition: Secondary | ICD-10-CM | POA: Diagnosis not present

## 2019-03-01 DIAGNOSIS — E7800 Pure hypercholesterolemia, unspecified: Secondary | ICD-10-CM | POA: Diagnosis not present

## 2019-03-01 DIAGNOSIS — F329 Major depressive disorder, single episode, unspecified: Secondary | ICD-10-CM | POA: Diagnosis not present

## 2019-03-01 DIAGNOSIS — Z7901 Long term (current) use of anticoagulants: Secondary | ICD-10-CM | POA: Diagnosis not present

## 2019-03-01 DIAGNOSIS — I4819 Other persistent atrial fibrillation: Secondary | ICD-10-CM | POA: Diagnosis not present

## 2019-03-01 DIAGNOSIS — M8580 Other specified disorders of bone density and structure, unspecified site: Secondary | ICD-10-CM | POA: Diagnosis not present

## 2019-03-01 DIAGNOSIS — Z794 Long term (current) use of insulin: Secondary | ICD-10-CM | POA: Diagnosis not present

## 2019-03-01 DIAGNOSIS — J440 Chronic obstructive pulmonary disease with acute lower respiratory infection: Secondary | ICD-10-CM | POA: Diagnosis not present

## 2019-03-21 ENCOUNTER — Other Ambulatory Visit: Payer: Self-pay | Admitting: *Deleted

## 2019-03-21 MED ORDER — RIVAROXABAN 15 MG PO TABS: 15.0000 mg | ORAL_TABLET | Freq: Every day | ORAL | 1 refills | Status: DC

## 2019-03-21 NOTE — Telephone Encounter (Signed)
Prescription refill request for Xarelto received.   Last office visit: Truitt Merle (02-01-2019) Weight: 40.4 kg Age: 85 yo Scr: 1.59 (02-01-2019) CrCl: 16 ml/min   Reviewed pt's refill with pharm D. Prescription refill sent.

## 2019-04-06 ENCOUNTER — Other Ambulatory Visit: Payer: Self-pay | Admitting: Orthopaedic Surgery

## 2019-04-22 ENCOUNTER — Telehealth: Payer: Self-pay | Admitting: Podiatry

## 2019-04-22 MED ORDER — PREGABALIN 50 MG PO CAPS: 50.0000 mg | ORAL_CAPSULE | Freq: Every day | ORAL | 5 refills | Status: DC

## 2019-04-22 NOTE — Telephone Encounter (Signed)
Pt called requesting a refill of Lyrica

## 2019-04-22 NOTE — Telephone Encounter (Signed)
I informed pt the Lyrica had been refilled. Left message [facility] [phone] with Lyrica orders.

## 2019-04-22 NOTE — Addendum Note (Signed)
Addended by: Harriett Sine D on: 04/22/2019 10:39 AM   Modules accepted: Orders

## 2019-05-15 DIAGNOSIS — Z794 Long term (current) use of insulin: Secondary | ICD-10-CM | POA: Diagnosis not present

## 2019-05-15 DIAGNOSIS — E119 Type 2 diabetes mellitus without complications: Secondary | ICD-10-CM | POA: Diagnosis not present

## 2019-05-26 ENCOUNTER — Ambulatory Visit (INDEPENDENT_AMBULATORY_CARE_PROVIDER_SITE_OTHER): Payer: Medicare Other | Admitting: *Deleted

## 2019-05-26 DIAGNOSIS — Z950 Presence of cardiac pacemaker: Secondary | ICD-10-CM

## 2019-05-26 LAB — CUP PACEART REMOTE DEVICE CHECK
Battery Impedance: 630 Ohm
Battery Remaining Longevity: 77 mo
Battery Voltage: 2.78 V
Brady Statistic RV Percent Paced: 100 %
Date Time Interrogation Session: 20210107091827
Implantable Lead Implant Date: 20080416
Implantable Lead Implant Date: 20080416
Implantable Lead Location: 753859
Implantable Lead Location: 753860
Implantable Lead Model: 4076
Implantable Lead Model: 5076
Implantable Pulse Generator Implant Date: 20150302
Lead Channel Impedance Value: 467 Ohm
Lead Channel Pacing Threshold Amplitude: 0.625 V
Lead Channel Pacing Threshold Pulse Width: 0.4 ms
Lead Channel Setting Pacing Amplitude: 2.5 V
Lead Channel Setting Pacing Pulse Width: 0.4 ms
Lead Channel Setting Sensing Sensitivity: 5.6 mV

## 2019-05-27 DIAGNOSIS — F411 Generalized anxiety disorder: Secondary | ICD-10-CM | POA: Diagnosis not present

## 2019-05-27 DIAGNOSIS — F339 Major depressive disorder, recurrent, unspecified: Secondary | ICD-10-CM | POA: Diagnosis not present

## 2019-05-27 DIAGNOSIS — I129 Hypertensive chronic kidney disease with stage 1 through stage 4 chronic kidney disease, or unspecified chronic kidney disease: Secondary | ICD-10-CM | POA: Diagnosis not present

## 2019-05-27 DIAGNOSIS — Z0000 Encounter for general adult medical examination without abnormal findings: Secondary | ICD-10-CM | POA: Diagnosis not present

## 2019-06-01 ENCOUNTER — Other Ambulatory Visit: Payer: Self-pay | Admitting: Family Medicine

## 2019-06-01 DIAGNOSIS — I129 Hypertensive chronic kidney disease with stage 1 through stage 4 chronic kidney disease, or unspecified chronic kidney disease: Secondary | ICD-10-CM | POA: Diagnosis not present

## 2019-06-01 DIAGNOSIS — R946 Abnormal results of thyroid function studies: Secondary | ICD-10-CM | POA: Diagnosis not present

## 2019-06-01 DIAGNOSIS — M810 Age-related osteoporosis without current pathological fracture: Secondary | ICD-10-CM | POA: Diagnosis not present

## 2019-06-01 DIAGNOSIS — E1149 Type 2 diabetes mellitus with other diabetic neurological complication: Secondary | ICD-10-CM | POA: Diagnosis not present

## 2019-06-07 ENCOUNTER — Other Ambulatory Visit: Payer: Self-pay

## 2019-06-07 ENCOUNTER — Ambulatory Visit: Payer: Medicare Other | Admitting: Orthopaedic Surgery

## 2019-06-07 ENCOUNTER — Ambulatory Visit: Payer: Self-pay

## 2019-06-07 ENCOUNTER — Encounter: Payer: Self-pay | Admitting: Orthopaedic Surgery

## 2019-06-07 DIAGNOSIS — S52022D Displaced fracture of olecranon process without intraarticular extension of left ulna, subsequent encounter for closed fracture with routine healing: Secondary | ICD-10-CM

## 2019-06-07 MED ORDER — SULFAMETHOXAZOLE-TRIMETHOPRIM 800-160 MG PO TABS: 1.0000 | ORAL_TABLET | Freq: Two times a day (BID) | ORAL | 0 refills | Status: DC

## 2019-06-07 MED ORDER — TRAMADOL HCL 50 MG PO TABS: 50.0000 mg | ORAL_TABLET | Freq: Three times a day (TID) | ORAL | 1 refills | Status: DC | PRN

## 2019-06-07 MED ORDER — LIDOCAINE HCL 1 % IJ SOLN
5.0000 mL | INTRAMUSCULAR | Status: AC | PRN
  Administered 2019-06-07: 15:00:00 5 mL

## 2019-06-07 NOTE — Progress Notes (Addendum)
Office Visit Note   Patient: [name]           Date of Birth: Dec 10, 1928           MRN: [id number] Visit Date: 06/07/2019              Requested by: Antony Contras, MD [place] [place],  [place] [address] PCP: Antony Contras, MD   Assessment & Plan: Visit Diagnoses:  1. Closed fracture of olecranon process of left ulna with routine healing, subsequent encounter     Plan: Aspiration of the elbow did not yield any fluid.  Compressive dressing was applied.  We will place her on empiric antibiotics for the next 10 days.  I would like to recheck her in a week.  Follow-Up Instructions: Return in about 1 week (around 06/14/2019).   Orders:  Orders Placed This Encounter  Procedures  . XR Elbow 2 Views Left   No orders of the defined types were placed in this encounter.     Procedures: Medium Joint Inj: L olecranon bursa on 06/07/2019 3:22 PM Medications: 5 mL lidocaine 1 %      Clinical Data: No additional findings.   Subjective: Chief Complaint  Patient presents with  . Left Elbow - Pain, Follow-up    HPI patient is a pleasant 86 year old female who comes in today with concerns about her left elbow.  She is status post ORIF left olecranon fracture 10/07/2018 with subsequent wound dehiscence.  Her tissues went on to heal.  She plateaued with range of motion and physical therapy back in October.  She notes this past Sunday, she woke up in the middle of the night with pain to the left elbow.  There is no injury or change in activity leading up to the onset of the pain.  No fevers or chills.  She does note increased pain over the past few days.  She has slight decreased range of motion due to this.  Review of Systems as detailed in HPI.  All others reviewed and are negative.   Objective: Vital Signs: LMP  (LMP Unknown)   Physical Exam well-developed well-nourished female no acute distress.  Alert and oriented x3.  Ortho Exam examination of her left  elbow reveals range of motion from about 45 to 90 degrees.  There is slight erythema with mild fluctuance to the lateral elbow.  This is mildly tender.  No drainage.  She is neurovascular intact distally.  Specialty Comments:  No specialty comments available.  Imaging: No results found.   PMFS History: Patient Active Problem List   Diagnosis Date Noted  . History of open reduction and internal fixation (ORIF) procedure 11/15/2018  . Wound dehiscence, surgical 11/04/2018  . Closed fracture of left olecranon process, initial encounter 09/20/2018  . Chronic anticoagulation 02/10/2014  . Pure hypercholesterolemia 02/10/2014  . Encounter for therapeutic drug monitoring 06/15/2013  . Atrioventricular block, complete ([place]) 06/07/2013  . Pacemaker-Medtronic 06/07/2013  . Atrial fibrillation ([place]) 02/24/2013   Past Medical History:  Diagnosis Date  . Ambulates with cane    straight  . Anxiety   . Arthritis    lower back  . CKD (chronic kidney disease), stage III   . Closed fracture of left olecranon process   . COPD (chronic obstructive pulmonary disease) ([place])   . Depression   . Diabetes mellitus without complication ([place])    type 2  . Headache   . Hypertension   . Orthostatic hypotension   .  Osteopenia   . Pacemaker   . 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 Permanent atrial fibrillation ([place])    a.  s/p AVN ablation 2010 with complete heart block s/p PPM (gen change 2015, pt preference for Coumadin).  . Protein calorie malnutrition ([place])   . S/P AV nodal ablation   . TIA (transient ischemic attack)     Family History  Problem Relation Age of Onset  . Stroke Father     Past Surgical History:  Procedure Laterality Date  . ABLATION  2012   AVN ablation by Dr Lovena Le  . cateracts Bilateral    eyes  . CYST REMOVAL TRUNK     from breast  . I & D EXTREMITY Left 11/04/2018   Procedure: IRRIGATION AND DEBRIDEMENT LEFT ELBOW, WOUND  VAC;  Surgeon: Leandrew Koyanagi, MD;  Location: [facility];  Service: Orthopedics;  Laterality: Left;  . I & D EXTREMITY Left 11/15/2018   Procedure: IRRIGATION AND DEBRIDEMENT LEFT ELBOW, WOUND VAC;  Surgeon: Leandrew Koyanagi, MD;  Location: [facility];  Service: Orthopedics;  Laterality: Left;  . ORIF ELBOW FRACTURE Left 10/07/2018   Procedure: OPEN REDUCTION INTERNAL FIXATION (ORIF) LEFT OLECRANON FRACTURE;  Surgeon: Leandrew Koyanagi, MD;  Location: [facility];  Service: Orthopedics;  Laterality: Left;  . PACEMAKER INSERTION    . PERMANENT PACEMAKER GENERATOR CHANGE N/A 07/18/2013   Procedure: PERMANENT PACEMAKER GENERATOR CHANGE;  Surgeon: Deboraha Sprang, MD;  Location[facility] LAB;  Service: Cardiovascular;  Laterality: N/A;  . 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 Smoking status: Current Every Day Smoker    Packs/day: 0.50    Years: 67.00    Pack years: 33.50    Types: Cigarettes  . Smokeless tobacco: Never Used  Substance and Sexual Activity  . Alcohol use: No    Alcohol/week: 0.0 standard drinks  . Drug use: No  . Sexual activity: Not on file

## 2019-06-13 ENCOUNTER — Telehealth: Payer: Self-pay | Admitting: Orthopaedic Surgery

## 2019-06-13 NOTE — Telephone Encounter (Signed)
Patient called needing advice about antibiotic medication. Patient would like a call back from Dr. Erlinda Hong nurse for advice. Patient phone number is [phone].

## 2019-06-13 NOTE — Telephone Encounter (Signed)
Let's do keflex 500 mg QID x 10 days

## 2019-06-13 NOTE — Telephone Encounter (Signed)
Abx is making her nauseous could not eat and kept throwing up. Abx is making her sick. She was taking Sulfa (Bactrim). She will follow up on Wednesday she said. Would like to know if she needs a new Abx?

## 2019-06-13 NOTE — Telephone Encounter (Signed)
XU

## 2019-06-14 ENCOUNTER — Other Ambulatory Visit: Payer: Self-pay

## 2019-06-14 MED ORDER — CEPHALEXIN 500 MG PO CAPS: ORAL_CAPSULE | ORAL | 0 refills | Status: DC

## 2019-06-14 NOTE — Telephone Encounter (Signed)
Patient aware.

## 2019-06-14 NOTE — Telephone Encounter (Signed)
RX sent to her pharm.

## 2019-06-15 ENCOUNTER — Ambulatory Visit: Payer: Medicare Other | Admitting: Orthopaedic Surgery

## 2019-06-15 ENCOUNTER — Encounter: Payer: Self-pay | Admitting: Orthopaedic Surgery

## 2019-06-15 ENCOUNTER — Other Ambulatory Visit: Payer: Self-pay

## 2019-06-15 DIAGNOSIS — S52022D Displaced fracture of olecranon process without intraarticular extension of left ulna, subsequent encounter for closed fracture with routine healing: Secondary | ICD-10-CM

## 2019-06-15 NOTE — Progress Notes (Signed)
Office Visit Note   Patient: [name]           Date of Birth: December 27, 1928           MRN: [id number] Visit Date: 06/15/2019              Requested by: Antony Contras, MD [address] [place],  [place] [address] PCP: Antony Contras, MD   Assessment & Plan: Visit Diagnoses:  1. Closed fracture of olecranon process of left ulna with routine healing, subsequent encounter     Plan: Impression is left elbow pain suspect posttraumatic arthritis.  I recommend Voltaren gel for this.  Since she started Keflex I did recommend just finishing the course.  We will see her back as needed.  Follow-Up Instructions: Return if symptoms worsen or fail to improve.   Orders:  No orders of the defined types were placed in this encounter.  No orders of the defined types were placed in this encounter.     Procedures: No procedures performed   Clinical Data: No additional findings.   Subjective: Chief Complaint  Patient presents with  . Left Elbow - Pain    [name] is here for follow-up of her left elbow.  She vomiting with a Bactrim so we switched to Keflex.  40 is she states that she does not have any significant pain other than the moves her elbow pain.  Pain is worse at night.   Review of Systems   Objective: Vital Signs: LMP  (LMP Unknown)   Physical Exam  Ortho Exam Left elbow exam shows no signs of infection.  Minimal swelling.  Range of motion is unchanged. Specialty Comments:  No specialty comments available.  Imaging: No results found.   PMFS History: Patient Active Problem List   Diagnosis Date Noted  . History of open reduction and internal fixation (ORIF) procedure 11/15/2018  . Wound dehiscence, surgical 11/04/2018  . Closed fracture of left olecranon process 09/20/2018  . Chronic anticoagulation 02/10/2014  . Pure hypercholesterolemia 02/10/2014  . Encounter for therapeutic drug monitoring 06/15/2013  . Atrioventricular block, complete ([place])  06/07/2013  . Pacemaker-Medtronic 06/07/2013  . Atrial fibrillation ([place]) 02/24/2013   Past Medical History:  Diagnosis Date  . Ambulates with cane    straight  . Anxiety   . Arthritis    lower back  . CKD (chronic kidney disease), stage III   . Closed fracture of left olecranon process   . COPD (chronic obstructive pulmonary disease) ([place])   . Depression   . Diabetes mellitus without complication ([place])    type 2  . Headache   . Hypertension   . Orthostatic hypotension   . Osteopenia   . Pacemaker   . 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 Permanent atrial fibrillation ([employer])    a.  s/p AVN ablation 2010 with complete heart block s/p PPM (gen change 2015, pt preference for Coumadin).  . Protein calorie malnutrition ([place])   . S/P AV nodal ablation   . TIA (transient ischemic attack)     Family History  Problem Relation Age of Onset  . Stroke Father     Past Surgical History:  Procedure Laterality Date  . ABLATION  2012   AVN ablation by Dr Lovena Le  . cateracts Bilateral    eyes  . CYST REMOVAL TRUNK     from breast  . I & D EXTREMITY Left 11/04/2018   Procedure: IRRIGATION  AND DEBRIDEMENT LEFT ELBOW, WOUND VAC;  Surgeon: Leandrew Koyanagi, MD;  Location: [place] [facility];  Service: Orthopedics;  Laterality: Left;  . I & D EXTREMITY Left 11/15/2018   Procedure: IRRIGATION AND DEBRIDEMENT LEFT ELBOW, WOUND VAC;  Surgeon: Leandrew Koyanagi, MD;  Location: [facility];  Service: Orthopedics;  Laterality: Left;  . ORIF ELBOW FRACTURE Left 10/07/2018   Procedure: OPEN REDUCTION INTERNAL FIXATION (ORIF) LEFT OLECRANON FRACTURE;  Surgeon: Leandrew Koyanagi, MD;  Location: [facility];  Service: Orthopedics;  Laterality: Left;  . PACEMAKER INSERTION    . PERMANENT PACEMAKER GENERATOR CHANGE N/A 07/18/2013   Procedure: PERMANENT PACEMAKER GENERATOR CHANGE;  Surgeon: Deboraha Sprang, MD;  Location[facility] C[facility] LAB;  Service: Cardiovascular;  Laterality: N/A;  .  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 Smoking status: Current Every Day Smoker    Packs/day: 0.50    Years: 67.00    Pack years: 33.50    Types: Cigarettes  . Smokeless tobacco: Never Used  Substance and Sexual Activity  . Alcohol use: No    Alcohol/week: 0.0 standard drinks  . Drug use: No  . Sexual activity: Not on file

## 2019-07-15 DIAGNOSIS — R7989 Other specified abnormal findings of blood chemistry: Secondary | ICD-10-CM | POA: Diagnosis not present

## 2019-07-25 ENCOUNTER — Ambulatory Visit: Payer: Medicare Other | Admitting: Internal Medicine

## 2019-07-25 ENCOUNTER — Encounter: Payer: Self-pay | Admitting: Internal Medicine

## 2019-07-25 ENCOUNTER — Other Ambulatory Visit: Payer: Self-pay

## 2019-07-25 VITALS — BP 136/78 | HR 86 | Ht 63.0 in | Wt 90.2 lb

## 2019-07-25 DIAGNOSIS — Z950 Presence of cardiac pacemaker: Secondary | ICD-10-CM

## 2019-07-25 DIAGNOSIS — I4819 Other persistent atrial fibrillation: Secondary | ICD-10-CM

## 2019-07-25 NOTE — Progress Notes (Signed)
Patient Care Team: Antony Contras, MD as PCP - General (Family Medicine) [name], MD as PCP - Cardiology (Cardiology)   HPI  [name] is a 86 y.o. female  With PhD in [place] from [place] for pacemaker followup This had been undertaken for tachybradycardia syndrome. 2012 she underwent AV junction ablation by Dr. Lovena Le.  The patient denies chest pain, shortness of breath, nocturnal dyspnea, orthopnea or peripheral edema.  There have been no palpitations, lightheadedness or syncope.   Biggest issue has been unsteadiness.  She also fell this summer and fractured her left elbow; requiring multiple surgeries including drain; thankfully no evidence of device infection  On Anticoagulation;  No bleeding issues    DATE TEST EF   9/18 Echo   60-65 %         Date Cr K Hgb  9/20 1.59 3.9<<3.2 23.3     Thromboembolic risk profile includes prior TIA-2, hypertension-1, diabetes-1, gender-1, age-29 for a CHADS-VASc score of 7  She has been on coumadin with prior discussions re NOAC      Past Medical History:  Diagnosis Date  . Ambulates with cane    straight  . Anxiety   . Arthritis    lower back  . CKD (chronic kidney disease), stage III   . Closed fracture of left olecranon process   . COPD (chronic obstructive pulmonary disease) ([place])   . Depression   . Diabetes mellitus without complication ([place])    type 2  . Headache   . Hypertension   . Orthostatic hypotension   . Osteopenia   . Pacemaker   . 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 Permanent atrial fibrillation ([place])    a.  s/p AVN ablation 2010 with complete heart block s/p PPM (gen change 2015, pt preference for Coumadin).  . Protein calorie malnutrition ([place])   . S/P AV nodal ablation   . TIA (transient ischemic attack)     Past Surgical History:  Procedure Laterality Date  . ABLATION  2012   AVN ablation by Dr Lovena Le  . cateracts Bilateral    eyes  .  CYST REMOVAL TRUNK     from breast  . I & D EXTREMITY Left 11/04/2018   Procedure: IRRIGATION AND DEBRIDEMENT LEFT ELBOW, WOUND VAC;  Surgeon: Leandrew Koyanagi, MD;  Location: [facility];  Service: Orthopedics;  Laterality: Left;  . I & D EXTREMITY Left 11/15/2018   Procedure: IRRIGATION AND DEBRIDEMENT LEFT ELBOW, WOUND VAC;  Surgeon: Leandrew Koyanagi, MD;  Location: [facility];  Service: Orthopedics;  Laterality: Left;  . ORIF ELBOW FRACTURE Left 10/07/2018   Procedure: OPEN REDUCTION INTERNAL FIXATION (ORIF) LEFT OLECRANON FRACTURE;  Surgeon: Leandrew Koyanagi, MD;  Location: [facility];  Service: Orthopedics;  Laterality: Left;  . PACEMAKER INSERTION    . PERMANENT PACEMAKER GENERATOR CHANGE N/A 07/18/2013   Procedure: PERMANENT PACEMAKER GENERATOR CHANGE;  Surgeon: Deboraha Sprang, MD;  Location: [facility] CATH LAB;  Service: Cardiovascular;  Laterality: N/A;  . WISDOM TOOTH EXTRACTION      Current Outpatient Medications  Medication Sig Dispense Refill  . BD PEN NEEDLE NANO U/F 32G X 4 MM MISC AS DIRECTED USE WITH TOUJEO/ DX E11.49 IN VITRO  2  . calcium-vitamin D (OSCAL WITH D) 500-200 MG-UNIT TABS tablet TAKE 1 TABLET BY MOUTH THREE TIMES A DAY 270 tablet 2  . cephALEXin (KEFLEX) 500 MG capsule  keflex 500 mg QID x 10 days. 40 capsule 0  . clonazePAM (KLONOPIN) 0.5 MG tablet TAKE 1 TABLET BY MOUTH EVERYDAY AT BEDTIME    . escitalopram (LEXAPRO) 20 MG tablet Take 20 mg by mouth daily.   0  . Insulin Glargine (TOUJEO SOLOSTAR) 300 UNIT/ML SOPN Inject 4 Units into the skin daily.     [place] Kitchen levothyroxine (SYNTHROID) 50 MCG tablet Take 50 mcg by mouth every morning.    Marland Kitchen losartan (COZAAR) 50 MG tablet Take 100 mg by mouth daily.    . magic mouthwash SOLN Take 5 mLs by mouth 3 (three) times daily.   0  . Melatonin 5 MG TABS Take 5 mg by mouth at bedtime.     . mirtazapine (REMERON) 15 MG tablet Take 15 mg by mouth at bedtime.    . Multiple Vitamin (MULTIVITAMIN) tablet Take 1 tablet by mouth daily.    .  pregabalin (LYRICA) 50 MG capsule Take 1 capsule (50 mg total) by mouth at bedtime. 30 capsule 5  . PREVIDENT 5000 DRY MOUTH 1.1 % GEL dental gel Take 1 application by mouth See admin instructions. Use to brush teeth every day, replacement for regular toothpaste    . Rivaroxaban (XARELTO) 15 MG TABS tablet Take 1 tablet (15 mg total) by mouth daily with supper. 90 tablet 1  . sulfamethoxazole-trimethoprim (BACTRIM DS) 800-160 MG tablet Take 1 tablet by mouth 2 (two) times daily. 20 tablet 0   No current facility-administered medications for this visit.    Allergies  Allergen Reactions  . Eliquis [Apixaban] Other (See Comments)    Made pt have weak muscles  . Gabapentin Nausea Only and Other (See Comments)    SHAKES AND NAUSEA   . Rozerem [Ramelteon] Other (See Comments)    DIZZY AND HEAD THROBBING  . Tikosyn [Dofetilide] Itching and Swelling    Mouth swells, itching  . Tradjenta [Linagliptin] Diarrhea and Other (See Comments)    Joint Pain, Stomach issues  . Temazepam Other (See Comments)    INSOMNIA    Review of Systems negative except from HPI and PMH  Physical Exam BP 136/78   Pulse 86   Ht 5\' 3"  (1.6 m)   Wt 90 lb 3.2 oz (40.9 kg)   LMP  (LMP Unknown)   SpO2 98%   BMI 15.98 kg/m  Well developed and well nourished in no acute distress HENT normal Neck supple with JVP-flat Clear Device pocket well healed; without hematoma or erythema.  There is no tethering  Regular rate and rhythm, no   murmur Abd-soft with active BS No Clubbing cyanosis  edema Skin-warm and dry A & Oriented  Grossly normal sensory and motor function  ECG atrial fibrillation with ventricular pacing at 86 Intervals-/13/41  Assessment and  Plan  Atrial fibrillation-permanent   Complete heart block s/p AV ablation  Hypertension   Grade 4 renal insufficiency  Pacemaker Medtronic The patient's device was interrogated.  The information was reviewed. No changes were made in the programming.      BP well controlled  Afib rate controlled by junction ablation.  Renal function stable and rivaroxaban dosed appropriately

## 2019-07-25 NOTE — Patient Instructions (Signed)

## 2019-07-26 DIAGNOSIS — H43813 Vitreous degeneration, bilateral: Secondary | ICD-10-CM | POA: Diagnosis not present

## 2019-07-26 DIAGNOSIS — H353131 Nonexudative age-related macular degeneration, bilateral, early dry stage: Secondary | ICD-10-CM | POA: Diagnosis not present

## 2019-07-26 DIAGNOSIS — Z961 Presence of intraocular lens: Secondary | ICD-10-CM | POA: Diagnosis not present

## 2019-07-26 DIAGNOSIS — E119 Type 2 diabetes mellitus without complications: Secondary | ICD-10-CM | POA: Diagnosis not present

## 2019-07-26 NOTE — Addendum Note (Signed)
Addended by: Mendel Ryder on: 07/26/2019 02:09 PM   Modules accepted: Orders

## 2019-08-05 ENCOUNTER — Ambulatory Visit: Payer: Medicare Other | Admitting: Cardiology

## 2019-08-05 ENCOUNTER — Encounter: Payer: Self-pay | Admitting: Cardiology

## 2019-08-05 ENCOUNTER — Other Ambulatory Visit: Payer: Self-pay

## 2019-08-05 VITALS — BP 130/70 | HR 88 | Ht 63.0 in | Wt 87.0 lb

## 2019-08-05 DIAGNOSIS — N184 Chronic kidney disease, stage 4 (severe): Secondary | ICD-10-CM

## 2019-08-05 DIAGNOSIS — Z7901 Long term (current) use of anticoagulants: Secondary | ICD-10-CM | POA: Diagnosis not present

## 2019-08-05 DIAGNOSIS — I4821 Permanent atrial fibrillation: Secondary | ICD-10-CM

## 2019-08-05 DIAGNOSIS — E440 Moderate protein-calorie malnutrition: Secondary | ICD-10-CM

## 2019-08-05 DIAGNOSIS — Z950 Presence of cardiac pacemaker: Secondary | ICD-10-CM | POA: Diagnosis not present

## 2019-08-05 NOTE — Progress Notes (Signed)
Cardiology Office Note:    Date:  08/05/2019   ID:  [name], DOB 1928-10-04, MRN [id number]  PCP:  Antony Contras, MD  Cardiologist:  Candee Furbish, MD  Electrophysiologist:  None   Referring MD: Antony Contras, MD     History of Present Illness:    [name] is a 86 y.o. female here for follow-up of atrial fibrillation, pacemaker.  Back in 2012 underwent AV junction ablation by Dr. Lovena Le.  Has been seen recently by Dr. Caryl Comes.  Still having some disequilibrium.  No palpitations no syncope.  She fell fracturing her elbow.  Multiple surgeries.  Overall feels well.  Trying to maintain weight.  It is down a little bit.  She has used [employer] breakfast in the past.  Normal EF 60%.  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  Past Medical History:  Diagnosis Date  . Ambulates with cane    straight  . Anxiety   . Arthritis    lower back  . CKD (chronic kidney disease), stage III   . Closed fracture of left olecranon process   . COPD (chronic obstructive pulmonary disease) ([place])   . Depression   . Diabetes mellitus without complication ([place])    type 2  . Headache   . Hypertension   . Orthostatic hypotension   . Osteopenia   . Pacemaker   . 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 Permanent atrial fibrillation ([place])    a.  s/p AVN ablation 2010 with complete heart block s/p PPM (gen change 2015, pt preference for Coumadin).  . Protein calorie malnutrition ([place])   . S/P AV nodal ablation   . TIA (transient ischemic attack)     Past Surgical History:  Procedure Laterality Date  . ABLATION  2012   AVN ablation by Dr Lovena Le  . cateracts Bilateral    eyes  . CYST REMOVAL TRUNK     from breast  . I & D EXTREMITY Left 11/04/2018   Procedure: IRRIGATION AND DEBRIDEMENT LEFT ELBOW, WOUND VAC;  Surgeon: Leandrew Koyanagi, MD;  Location: [place];  Service: Orthopedics;  Laterality: Left;  . I & D EXTREMITY Left 11/15/2018   Procedure:  IRRIGATION AND DEBRIDEMENT LEFT ELBOW, WOUND VAC;  Surgeon: Leandrew Koyanagi, MD;  Location: [place];  Service: Orthopedics;  Laterality: Left;  . ORIF ELBOW FRACTURE Left 10/07/2018   Procedure: OPEN REDUCTION INTERNAL FIXATION (ORIF) LEFT OLECRANON FRACTURE;  Surgeon: Leandrew Koyanagi, MD;  Location: [place];  Service: Orthopedics;  Laterality: Left;  . PACEMAKER INSERTION    . PERMANENT PACEMAKER GENERATOR CHANGE N/A 07/18/2013   Procedure: PERMANENT PACEMAKER GENERATOR CHANGE;  Surgeon: Deboraha Sprang, MD;  Location: [facility] CATH LAB;  Service: Cardiovascular;  Laterality: N/A;  . WISDOM TOOTH EXTRACTION      Current Medications: Current Meds  Medication Sig  . BD PEN NEEDLE NANO U/F 32G X 4 MM MISC AS DIRECTED USE WITH TOUJEO/ DX E11.49 IN VITRO  . calcium-vitamin D (OSCAL WITH D) 500-200 MG-UNIT TABS tablet TAKE 1 TABLET BY MOUTH THREE TIMES A DAY  . cephALEXin (KEFLEX) 500 MG capsule keflex 500 mg QID x 10 days.  . clonazePAM (KLONOPIN) 0.5 MG tablet TAKE 1 TABLET BY MOUTH EVERYDAY AT BEDTIME  . escitalopram (LEXAPRO) 20 MG tablet Take 20 mg by mouth daily.   . Insulin Glargine (TOUJEO SOLOSTAR) 300 UNIT/ML SOPN Inject 4 Units into the skin daily.   Marland Kitchen  levothyroxine (SYNTHROID) 50 MCG tablet Take 50 mcg by mouth every morning.  Marland Kitchen losartan (COZAAR) 50 MG tablet Take 100 mg by mouth daily.  . magic mouthwash SOLN Take 5 mLs by mouth 3 (three) times daily.   . Melatonin 5 MG TABS Take 5 mg by mouth at bedtime.   . mirtazapine (REMERON) 15 MG tablet Take 15 mg by mouth at bedtime.  . Multiple Vitamin (MULTIVITAMIN) tablet Take 1 tablet by mouth daily.  . pregabalin (LYRICA) 50 MG capsule Take 1 capsule (50 mg total) by mouth at bedtime.  Marland Kitchen PREVIDENT 5000 DRY MOUTH 1.1 % GEL dental gel Take 1 application by mouth See admin instructions. Use to brush teeth every day, replacement for regular toothpaste  . Rivaroxaban (XARELTO) 15 MG TABS tablet Take 1 tablet (15 mg total) by mouth daily  with supper.  . sulfamethoxazole-trimethoprim (BACTRIM DS) 800-160 MG tablet Take 1 tablet by mouth 2 (two) times daily.     Allergies:   Eliquis [apixaban], Gabapentin, Rozerem [ramelteon], Tikosyn [dofetilide], Tradjenta [linagliptin], and Temazepam   Social History   Socioeconomic History  . Marital status: Married    Spouse name: [name]  . Number of children: 0  . Years of education: 1  . 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 Smoking status: Current Every Day Smoker    Packs/day: 0.50    Years: 67.00    Pack years: 33.50    Types: Cigarettes  . Smokeless tobacco: Never Used  Substance and Sexual Activity  . Alcohol use: No    Alcohol/week: 0.0 standard drinks  . Drug use: No  . Sexual activity: Not on file  Other Topics Concern  . Not on file  Social History Narrative   Lives at home with husband.  Education PHDD.  No children.     Social Determinants of Health   Financial Resource Strain:   . Difficulty of Paying Living Expenses:   Food Insecurity:   . Worried About Charity fundraiser in the Last Year:   . Arboriculturist in the Last Year:   Transportation Needs:   . Film/video editor (Medical):   Marland Kitchen Lack of Transportation (Non-Medical):   Physical Activity:   . Days of Exercise per Week:   . Minutes of Exercise per Session:   Stress:   . Feeling of Stress :   Social Connections:   . Frequency of Communication with Friends and Family:   . Frequency of Social Gatherings with Friends and Family:   . Attends Religious Services:   . Active Member of Clubs or Organizations:   . Attends Archivist Meetings:   Marland Kitchen Marital Status:      Family History: The patient's family history includes Stroke in her father.  ROS:   Please see the history of present illness.    No chest pain no shortness of breath no fevers no chills no bleeding all other systems reviewed and are  negative.  EKGs/Labs/Other Studies Reviewed:    The following studies were reviewed today: Prior echocardiogram  EKG:  EKG is not ordered today.  Recent Labs: 01/19/2019: ALT 15 02/01/2019: BUN 17; Creatinine, Ser 1.59; Hemoglobin 13.9; Platelets 211; Potassium 3.9; Sodium 138  Recent Lipid Panel    Component Value Date/Time   CHOL  07/08/2010 0612    89        ATP III CLASSIFICATION:  <200  mg/dL   Desirable  200-239  mg/dL   Borderline High  >=240    mg/dL   High          TRIG 99 07/08/2010 0612   HDL 35 (L) 07/08/2010 0612   CHOLHDL 2.5 07/08/2010 0612   VLDL 20 07/08/2010 0612   LDLCALC  07/08/2010 0612    34        Total Cholesterol/HDL:CHD Risk Coronary Heart Disease Risk Table                     Men   Women  1/2 Average Risk   3.4   3.3  Average Risk       5.0   4.4  2 X Average Risk   9.6   7.1  3 X Average Risk  23.4   11.0        Use the calculated Patient Ratio above and the CHD Risk Table to determine the patient's CHD Risk.        ATP III CLASSIFICATION (LDL):  <100     mg/dL   Optimal  100-129  mg/dL   Near or Above                    Optimal  130-159  mg/dL   Borderline  160-189  mg/dL   High  >190     mg/dL   Very High    Physical Exam:    VS:  Pulse 88   Ht 5\' 3"  (1.6 m)   Wt 87 lb (39.5 kg)   LMP  (LMP Unknown)   SpO2 97%   BMI 15.41 kg/m     Wt Readings from Last 3 Encounters:  08/05/19 87 lb (39.5 kg)  07/25/19 90 lb 3.2 oz (40.9 kg)  02/22/19 89 lb (40.4 kg)     GEN: Thin in no acute distress HEENT: Normal NECK: No JVD; No carotid bruits LYMPHATICS: No lymphadenopathy CARDIAC: RRR, no murmurs, rubs, gallops RESPIRATORY:  Clear to auscultation without rales, wheezing or rhonchi  ABDOMEN: Soft, non-tender, non-distended MUSCULOSKELETAL:  No edema; No deformity  SKIN: Warm and dry NEUROLOGIC:  Alert and oriented x 3 PSYCHIATRIC:  Normal affect   ASSESSMENT:    No diagnosis found. PLAN:    In order of problems listed  above:  Atrial fibrillation -Permanent continue with anticoagulation  Chronic anticoagulation/secondary hypercoagulable state -Xarelto 15 mg.  Creatinine clearance adjusted.  Creatinine 1.22 last check.  Hemoglobin 15  Complete heart block status post AV nodal ablation -Medtronic pacer.  Dr. Caryl Comes.  Functioning well.  Chronic kidney disease stage IV -Dose adjusted Xarelto.  Protein calorie malnutrition -Encourage calorie intake.  She has used instant breakfast in the past Carnation.  Continue to encourage.   Medication Adjustments/Labs and Tests Ordered: Current medicines are reviewed at length with the patient today.  Concerns regarding medicines are outlined above.  No orders of the defined types were placed in this encounter.  No orders of the defined types were placed in this encounter.   There are no Patient Instructions on file for this visit.   Signed, Candee Furbish, MD  08/05/2019 11:09 AM    [facility]

## 2019-08-05 NOTE — Patient Instructions (Signed)
Medication Instructions:  The current medical regimen is effective;  continue present plan and medications.  *If you need a refill on your cardiac medications before your next appointment, please call your pharmacy*  Follow-Up: At [facility], you and your health needs are our priority.  As part of our continuing mission to provide you with exceptional heart care, we have created designated Provider Care Teams.  These Care Teams include your primary Cardiologist (physician) and Advanced Practice Providers (APPs -  Physician Assistants and Nurse Practitioners) who all work together to provide you with the care you need, when you need it.  We recommend signing up for the patient portal called "MyChart".  Sign up information is provided on this After Visit Summary.  MyChart is used to connect with patients for Virtual Visits (Telemedicine).  Patients are able to view lab/test results, encounter notes, upcoming appointments, etc.  Non-urgent messages can be sent to your provider as well.   To learn more about what you can do with MyChart, go to [email or web address].    Your next appointment:   6 month(s)  The format for your next appointment:   In Person  Provider:   Truitt Merle, NP And Dr Candee Furbish in 1 year.  Thank you for choosing [facility]!!

## 2019-08-25 ENCOUNTER — Ambulatory Visit (INDEPENDENT_AMBULATORY_CARE_PROVIDER_SITE_OTHER): Payer: Medicare Other | Admitting: *Deleted

## 2019-08-25 DIAGNOSIS — G2581 Restless legs syndrome: Secondary | ICD-10-CM | POA: Diagnosis not present

## 2019-08-25 DIAGNOSIS — Z950 Presence of cardiac pacemaker: Secondary | ICD-10-CM | POA: Diagnosis not present

## 2019-08-25 DIAGNOSIS — G4700 Insomnia, unspecified: Secondary | ICD-10-CM | POA: Diagnosis not present

## 2019-08-25 LAB — CUP PACEART REMOTE DEVICE CHECK
Battery Impedance: 758 Ohm
Battery Remaining Longevity: 71 mo
Battery Voltage: 2.77 V
Brady Statistic RV Percent Paced: 100 %
Date Time Interrogation Session: 20210408093053
Implantable Lead Implant Date: 20080416
Implantable Lead Implant Date: 20080416
Implantable Lead Location: 753859
Implantable Lead Location: 753860
Implantable Lead Model: 4076
Implantable Lead Model: 5076
Implantable Pulse Generator Implant Date: 20150302
Lead Channel Impedance Value: 509 Ohm
Lead Channel Impedance Value: 67 Ohm
Lead Channel Pacing Threshold Amplitude: 0.625 V
Lead Channel Pacing Threshold Pulse Width: 0.4 ms
Lead Channel Setting Pacing Amplitude: 2.5 V
Lead Channel Setting Pacing Pulse Width: 0.4 ms
Lead Channel Setting Sensing Sensitivity: 5.6 mV

## 2019-08-25 NOTE — Progress Notes (Signed)
PPM Remote  

## 2019-09-02 ENCOUNTER — Other Ambulatory Visit: Payer: Self-pay

## 2019-09-02 ENCOUNTER — Ambulatory Visit
Admission: RE | Admit: 2019-09-02 | Discharge: 2019-09-02 | Disposition: A | Discharge status: Home / Self Care | Payer: Medicare Other | Source: Ambulatory Visit | Attending: Family Medicine | Admitting: Family Medicine

## 2019-09-02 DIAGNOSIS — M810 Age-related osteoporosis without current pathological fracture: Secondary | ICD-10-CM

## 2019-09-02 DIAGNOSIS — R7989 Other specified abnormal findings of blood chemistry: Secondary | ICD-10-CM | POA: Diagnosis not present

## 2019-09-02 DIAGNOSIS — M85831 Other specified disorders of bone density and structure, right forearm: Secondary | ICD-10-CM | POA: Diagnosis not present

## 2019-09-02 DIAGNOSIS — Z780 Asymptomatic menopausal state: Secondary | ICD-10-CM | POA: Diagnosis not present

## 2019-09-16 ENCOUNTER — Other Ambulatory Visit: Payer: Self-pay | Admitting: *Deleted

## 2019-09-16 MED ORDER — RIVAROXABAN 15 MG PO TABS: 15.0000 mg | ORAL_TABLET | Freq: Every day | ORAL | 1 refills | Status: DC

## 2019-09-16 NOTE — Telephone Encounter (Addendum)
Prescription refill request for Xarelto received.   Last office visit: [place], 08/05/2019 Weight: 39.7 kg Age: 86 yo Scr: 1.24, 06/01/2019 via [facility] CrCl: 19.56 ml/min  Prescription refill sent.

## 2019-10-17 ENCOUNTER — Other Ambulatory Visit: Payer: Self-pay | Admitting: Podiatry

## 2019-10-19 ENCOUNTER — Telehealth: Payer: Self-pay | Admitting: Podiatry

## 2019-10-19 NOTE — Telephone Encounter (Signed)
Pt called and stated that she would like a refill of lyrica she has seen Dr.Hyatt he was the one who originally gave medication pt sees Dr.Mayer for nail trims pt has not been seen by either DR since  2020 please advise on the refill

## 2019-10-20 MED ORDER — PREGABALIN 50 MG PO CAPS: 50.0000 mg | ORAL_CAPSULE | Freq: Every day | ORAL | 5 refills | Status: DC

## 2019-10-20 NOTE — Addendum Note (Signed)
Addended by: Harriett Sine D on: 10/20/2019 09:43 AM   Modules accepted: Orders

## 2019-10-20 NOTE — Telephone Encounter (Addendum)
Left message for orders Lyrica 50mg  #30 to [id number].

## 2019-11-24 ENCOUNTER — Ambulatory Visit (INDEPENDENT_AMBULATORY_CARE_PROVIDER_SITE_OTHER): Payer: Medicare Other | Admitting: *Deleted

## 2019-11-24 DIAGNOSIS — I442 Atrioventricular block, complete: Secondary | ICD-10-CM | POA: Diagnosis not present

## 2019-11-24 LAB — CUP PACEART REMOTE DEVICE CHECK
Battery Impedance: 810 Ohm
Battery Remaining Longevity: 68 mo
Battery Voltage: 2.77 V
Brady Statistic RV Percent Paced: 100 %
Date Time Interrogation Session: 20210708093211
Implantable Lead Implant Date: 20080416
Implantable Lead Implant Date: 20080416
Implantable Lead Location: 753859
Implantable Lead Location: 753860
Implantable Lead Model: 4076
Implantable Lead Model: 5076
Implantable Pulse Generator Implant Date: 20150302
Lead Channel Impedance Value: 472 Ohm
Lead Channel Impedance Value: 67 Ohm
Lead Channel Pacing Threshold Amplitude: 0.625 V
Lead Channel Pacing Threshold Pulse Width: 0.4 ms
Lead Channel Setting Pacing Amplitude: 2.5 V
Lead Channel Setting Pacing Pulse Width: 0.4 ms
Lead Channel Setting Sensing Sensitivity: 5.6 mV

## 2019-11-25 NOTE — Progress Notes (Signed)
Remote pacemaker transmission.   

## 2019-12-02 DIAGNOSIS — F339 Major depressive disorder, recurrent, unspecified: Secondary | ICD-10-CM | POA: Diagnosis not present

## 2019-12-02 DIAGNOSIS — E039 Hypothyroidism, unspecified: Secondary | ICD-10-CM | POA: Diagnosis not present

## 2019-12-02 DIAGNOSIS — I129 Hypertensive chronic kidney disease with stage 1 through stage 4 chronic kidney disease, or unspecified chronic kidney disease: Secondary | ICD-10-CM | POA: Diagnosis not present

## 2019-12-02 DIAGNOSIS — F411 Generalized anxiety disorder: Secondary | ICD-10-CM | POA: Diagnosis not present

## 2020-01-15 ENCOUNTER — Other Ambulatory Visit: Payer: Self-pay | Admitting: Orthopaedic Surgery

## 2020-01-17 ENCOUNTER — Ambulatory Visit: Payer: Medicare Other | Admitting: Cardiology

## 2020-01-17 ENCOUNTER — Other Ambulatory Visit: Payer: Self-pay

## 2020-01-17 ENCOUNTER — Encounter: Payer: Self-pay | Admitting: Cardiology

## 2020-01-17 VITALS — BP 112/72 | HR 87 | Ht 63.0 in | Wt 92.6 lb

## 2020-01-17 DIAGNOSIS — Z7901 Long term (current) use of anticoagulants: Secondary | ICD-10-CM | POA: Diagnosis not present

## 2020-01-17 DIAGNOSIS — I4821 Permanent atrial fibrillation: Secondary | ICD-10-CM

## 2020-01-17 DIAGNOSIS — Z950 Presence of cardiac pacemaker: Secondary | ICD-10-CM

## 2020-01-17 DIAGNOSIS — I442 Atrioventricular block, complete: Secondary | ICD-10-CM | POA: Diagnosis not present

## 2020-01-17 DIAGNOSIS — N184 Chronic kidney disease, stage 4 (severe): Secondary | ICD-10-CM

## 2020-01-17 NOTE — Progress Notes (Signed)
Cardiology Office Note:    Date:  01/17/2020   ID:  [name], DOB 05/13/1929, MRN [id number]  PCP:  Antony Contras, MD  [facility] Cardiologist:  Candee Furbish, MD  [facility] Electrophysiologist:  None   Referring MD: Antony Contras, MD     History of Present Illness:    [name] is a 86 y.o. female here for follow-up of atrial fibrillation pacemaker.  Prior AV junction ablation Dr. Lovena Le 2012-pacemaker dependent  Disequilibrium at times.  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  Been trying to maintain weight with [employer] instant breakfast.    Past Medical History:  Diagnosis Date  . Ambulates with cane    straight  . Anxiety   . Arthritis    lower back  . CKD (chronic kidney disease), stage III   . Closed fracture of left olecranon process   . COPD (chronic obstructive pulmonary disease) ([place])   . Depression   . Diabetes mellitus without complication ([place])    type 2  . Headache   . Hypertension   . Orthostatic hypotension   . Osteopenia   . Pacemaker   . 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 Permanent atrial fibrillation ([place])    a.  s/p AVN ablation 2010 with complete heart block s/p PPM (gen change 2015, pt preference for Coumadin).  . Protein calorie malnutrition ([place])   . S/P AV nodal ablation   . TIA (transient ischemic attack)     Past Surgical History:  Procedure Laterality Date  . ABLATION  2012   AVN ablation by Dr Lovena Le  . cateracts Bilateral    eyes  . CYST REMOVAL TRUNK     from breast  . I & D EXTREMITY Left 11/04/2018   Procedure: IRRIGATION AND DEBRIDEMENT LEFT ELBOW, WOUND VAC;  Surgeon: Leandrew Koyanagi, MD;  Location: [place];  Service: Orthopedics;  Laterality: Left;  . I & D EXTREMITY Left 11/15/2018   Procedure: IRRIGATION AND DEBRIDEMENT LEFT ELBOW, WOUND VAC;  Surgeon: Leandrew Koyanagi, MD;  Location: [place];  Service: Orthopedics;  Laterality: Left;  . ORIF ELBOW FRACTURE  Left 10/07/2018   Procedure: OPEN REDUCTION INTERNAL FIXATION (ORIF) LEFT OLECRANON FRACTURE;  Surgeon: Leandrew Koyanagi, MD;  Location: [place];  Service: Orthopedics;  Laterality: Left;  . PACEMAKER INSERTION    . PERMANENT PACEMAKER GENERATOR CHANGE N/A 07/18/2013   Procedure: PERMANENT PACEMAKER GENERATOR CHANGE;  Surgeon: Deboraha Sprang, MD;  Location[facility] C[facility] LAB;  Service: Cardiovascular;  Laterality: N/A;  . WISDOM TOOTH EXTRACTION      Current Medications: Current Meds  Medication Sig  . BD PEN NEEDLE NANO U/F 32G X 4 MM MISC AS DIRECTED USE WITH TOUJEO/ DX E11.49 IN VITRO  . calcium-vitamin D (OSCAL WITH D) 500-200 MG-UNIT TABS tablet TAKE 1 TABLET BY MOUTH THREE TIMES A DAY  . clonazePAM (KLONOPIN) 0.5 MG tablet TAKE 1 TABLET BY MOUTH EVERYDAY AT BEDTIME  . escitalopram (LEXAPRO) 20 MG tablet Take 20 mg by mouth daily.   . Insulin Glargine (TOUJEO SOLOSTAR) 300 UNIT/ML SOPN Inject 4 Units into the skin daily.   Marland Kitchen levothyroxine (SYNTHROID) 50 MCG tablet Take 50 mcg by mouth every morning.  Marland Kitchen losartan (COZAAR) 50 MG tablet Take 100 mg by mouth daily.  . magic mouthwash SOLN Take 5 mLs by mouth 3 (three) times daily.   . Melatonin 5 MG TABS Take 5 mg  by mouth at bedtime.   . mirtazapine (REMERON) 15 MG tablet Take 15 mg by mouth at bedtime.  . Multiple Vitamin (MULTIVITAMIN) tablet Take 1 tablet by mouth daily.  . pregabalin (LYRICA) 50 MG capsule Take 1 capsule (50 mg total) by mouth at bedtime.  Marland Kitchen PREVIDENT 5000 DRY MOUTH 1.1 % GEL dental gel Take 1 application by mouth See admin instructions. Use to brush teeth every day, replacement for regular toothpaste  . Rivaroxaban (XARELTO) 15 MG TABS tablet Take 1 tablet (15 mg total) by mouth daily with supper.  . sulfamethoxazole-trimethoprim (BACTRIM DS) 800-160 MG tablet Take 1 tablet by mouth 2 (two) times daily.     Allergies:   Eliquis [apixaban], Gabapentin, Rozerem [ramelteon], Tikosyn [dofetilide], Tradjenta  [linagliptin], and Temazepam   Social History   Socioeconomic History  . Marital status: Married    Spouse name: [name]  . Number of children: 0  . Years of education: 53  . 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 Smoking status: Current Every Day Smoker    Packs/day: 0.50    Years: 67.00    Pack years: 33.50    Types: Cigarettes  . Smokeless tobacco: Never Used  Vaping Use  . Vaping Use: Never used  Substance and Sexual Activity  . Alcohol use: No    Alcohol/week: 0.0 standard drinks  . Drug use: No  . Sexual activity: Not on file  Other Topics Concern  . Not on file  Social History Narrative   Lives at home with husband.  Education PHDD.  No children.     Social Determinants of Health   Financial Resource Strain:   . Difficulty of Paying Living Expenses: Not on file  Food Insecurity:   . Worried About Charity fundraiser in the Last Year: Not on file  . Ran Out of Food in the Last Year: Not on file  Transportation Needs:   . Lack of Transportation (Medical): Not on file  . Lack of Transportation (Non-Medical): Not on file  Physical Activity:   . Days of Exercise per Week: Not on file  . Minutes of Exercise per Session: Not on file  Stress:   . Feeling of Stress : Not on file  Social Connections:   . Frequency of Communication with Friends and Family: Not on file  . Frequency of Social Gatherings with Friends and Family: Not on file  . Attends Religious Services: Not on file  . Active Member of Clubs or Organizations: Not on file  . Attends Archivist Meetings: Not on file  . Marital Status: Not on file     Family History: The patient's family history includes Stroke in her father.  ROS:   Please see the history of present illness.     All other systems reviewed and are negative.  EKGs/Labs/Other Studies Reviewed:    The following studies were reviewed today:  Echocardiogram  01/26/2017: - Left ventricle: The cavity size was normal. Systolic function was  normal. The estimated ejection fraction was in the range of 60%  to 65%. Wall motion was normal; there were no regional wall  motion abnormalities. The study is not technically sufficient to  allow evaluation of LV diastolic function.  - Aortic valve: Transvalvular velocity was within the normal range.  There was no stenosis. There was no regurgitation.  - Mitral valve: Calcified annulus. Transvalvular velocity was  within the  normal range. There was no evidence for stenosis.  There was mild regurgitation.  - Left atrium: The atrium was mildly dilated.  - Right ventricle: The cavity size was normal. Wall thickness was  normal. Systolic function was normal.  - Right atrium: The atrium was severely dilated.  - Atrial septum: No defect or patent foramen ovale was identified.  - Tricuspid valve: There was mild regurgitation.  - Pulmonary arteries: Systolic pressure was within the normal  range. PA peak pressure: 25 mm Hg (S).   EKG: 07/25/2019-atrial fibrillation/flutter pacemaker backup  Recent Labs: 01/19/2019: ALT 15 02/01/2019: BUN 17; Creatinine, Ser 1.59; Hemoglobin 13.9; Platelets 211; Potassium 3.9; Sodium 138  Recent Lipid Panel    Component Value Date/Time   CHOL  07/08/2010 0612    89        ATP III CLASSIFICATION:  <200     mg/dL   Desirable  200-239  mg/dL   Borderline High  >=240    mg/dL   High          TRIG 99 07/08/2010 0612   HDL 35 (L) 07/08/2010 0612   CHOLHDL 2.5 07/08/2010 0612   VLDL 20 07/08/2010 0612   LDLCALC  07/08/2010 0612    34        Total Cholesterol/HDL:CHD Risk Coronary Heart Disease Risk Table                     Men   Women  1/2 Average Risk   3.4   3.3  Average Risk       5.0   4.4  2 X Average Risk   9.6   7.1  3 X Average Risk  23.4   11.0        Use the calculated Patient Ratio above and the CHD Risk Table to determine the patient's CHD  Risk.        ATP III CLASSIFICATION (LDL):  <100     mg/dL   Optimal  100-129  mg/dL   Near or Above                    Optimal  130-159  mg/dL   Borderline  160-189  mg/dL   High  >190     mg/dL   Very High    Physical Exam:    VS:  BP 112/72   Pulse 87   Ht 5\' 3"  (1.6 m)   Wt 92 lb 9.6 oz (42 kg)   LMP  (LMP Unknown)   SpO2 94%   BMI 16.40 kg/m     Wt Readings from Last 3 Encounters:  01/17/20 92 lb 9.6 oz (42 kg)  08/05/19 87 lb (39.5 kg)  07/25/19 90 lb 3.2 oz (40.9 kg)     GEN: Thin in no acute distress HEENT: Normal NECK: No JVD; No carotid bruits LYMPHATICS: No lymphadenopathy CARDIAC: RRR, no murmurs, rubs, gallops RESPIRATORY:  Clear to auscultation without rales, wheezing or rhonchi  ABDOMEN: Soft, non-tender, non-distended MUSCULOSKELETAL:  No edema; No deformity  SKIN: Warm and dry NEUROLOGIC:  Alert and oriented x 3 PSYCHIATRIC:  Normal affect   ASSESSMENT:    1. Permanent atrial fibrillation ([place])   2. Pacemaker   3. Atrioventricular block, complete ([place])   4. Chronic anticoagulation   5. CKD (chronic kidney disease) stage 4, GFR 15-29 ml/min (HCC)    PLAN:    In order of problems listed above:  Atrial fibrillation permanent -Pacemaker for backup  rate. -Doing well without any changes.  Permanent pacemaker [employer] -Functioning well. AV nodal ablation. Pacemaker dependent.  Chronic anticoagulation -Continue with Xarelto dose adjusted 15 mg.  No bleeding.  Last hemoglobin 15, creatinine 1.59 from outside labs.  ALT 17, LDL 71  Chronic kidney disease stage IV -GFR of 29.  Avoid NSAIDs.  On angiotensin receptor blocker, losartan for renal protection.  Protein calorie malnutrition -Continue to encourage protein intake.  Diabetes -Taking insulin.  Hemoglobin A1c 6.8.  Dr. Moreen Fowler has been monitoring.  Fracture left elbow -This is healed. Took a long time.  Medication Adjustments/Labs and Tests Ordered: Current medicines are reviewed  at length with the patient today.  Concerns regarding medicines are outlined above.  No orders of the defined types were placed in this encounter.  No orders of the defined types were placed in this encounter.   Patient Instructions  Medication Instructions:  The current medical regimen is effective;  continue present plan and medications.  *If you need a refill on your cardiac medications before your next appointment, please call your pharmacy*  Follow-Up: At [facility], you and your health needs are our priority.  As part of our continuing mission to provide you with exceptional heart care, we have created designated Provider Care Teams.  These Care Teams include your primary Cardiologist (physician) and Advanced Practice Providers (APPs -  Physician Assistants and Nurse Practitioners) who all work together to provide you with the care you need, when you need it.  We recommend signing up for the patient portal called "MyChart".  Sign up information is provided on this After Visit Summary.  MyChart is used to connect with patients for Virtual Visits (Telemedicine).  Patients are able to view lab/test results, encounter notes, upcoming appointments, etc.  Non-urgent messages can be sent to your provider as well.   To learn more about what you can do with MyChart, go to [email or web address].    Your next appointment:   6 month(s)  The format for your next appointment:   In Person  Provider:   Candee Furbish, MD   Thank you for choosing [facility]!!        Signed, Candee Furbish, MD  01/17/2020 11:50 AM    [place]

## 2020-01-17 NOTE — Patient Instructions (Signed)

## 2020-02-24 ENCOUNTER — Ambulatory Visit (INDEPENDENT_AMBULATORY_CARE_PROVIDER_SITE_OTHER): Payer: Medicare Other

## 2020-02-24 DIAGNOSIS — I4819 Other persistent atrial fibrillation: Secondary | ICD-10-CM

## 2020-02-25 LAB — CUP PACEART REMOTE DEVICE CHECK
Battery Impedance: 860 Ohm
Battery Remaining Longevity: 67 mo
Battery Voltage: 2.76 V
Brady Statistic RV Percent Paced: 100 %
Date Time Interrogation Session: 20211008112931
Implantable Lead Implant Date: 20080416
Implantable Lead Implant Date: 20080416
Implantable Lead Location: 753859
Implantable Lead Location: 753860
Implantable Lead Model: 4076
Implantable Lead Model: 5076
Implantable Pulse Generator Implant Date: 20150302
Lead Channel Impedance Value: 506 Ohm
Lead Channel Impedance Value: 67 Ohm
Lead Channel Pacing Threshold Amplitude: 0.625 V
Lead Channel Pacing Threshold Pulse Width: 0.4 ms
Lead Channel Setting Pacing Amplitude: 2.5 V
Lead Channel Setting Pacing Pulse Width: 0.4 ms
Lead Channel Setting Sensing Sensitivity: 5.6 mV

## 2020-02-28 NOTE — Progress Notes (Signed)
Remote pacemaker transmission.   

## 2020-03-01 DIAGNOSIS — G2581 Restless legs syndrome: Secondary | ICD-10-CM | POA: Diagnosis not present

## 2020-03-01 DIAGNOSIS — G4700 Insomnia, unspecified: Secondary | ICD-10-CM | POA: Diagnosis not present

## 2020-03-14 ENCOUNTER — Telehealth: Payer: Self-pay

## 2020-03-14 NOTE — Telephone Encounter (Signed)
Called patient. She states she cannot come in tomorrow. She will have to come in Friday. States she has another appt.

## 2020-03-14 NOTE — Telephone Encounter (Signed)
Patient states she fell, didn't hurt the surgical elbow but above it, states very painful to move arm at all, I was going to work her in tomorrow but it says not to, can you help her out and put her on the schedule somewhere?

## 2020-03-16 ENCOUNTER — Ambulatory Visit: Payer: Self-pay

## 2020-03-16 ENCOUNTER — Encounter: Payer: Self-pay | Admitting: Orthopaedic Surgery

## 2020-03-16 ENCOUNTER — Other Ambulatory Visit: Payer: Self-pay

## 2020-03-16 ENCOUNTER — Ambulatory Visit (INDEPENDENT_AMBULATORY_CARE_PROVIDER_SITE_OTHER): Payer: Medicare Other | Admitting: Orthopaedic Surgery

## 2020-03-16 VITALS — Ht 63.0 in | Wt 95.0 lb

## 2020-03-16 DIAGNOSIS — M25522 Pain in left elbow: Secondary | ICD-10-CM

## 2020-03-16 DIAGNOSIS — M79622 Pain in left upper arm: Secondary | ICD-10-CM | POA: Diagnosis not present

## 2020-03-16 MED ORDER — TRAMADOL HCL 50 MG PO TABS: 50.0000 mg | ORAL_TABLET | Freq: Every day | ORAL | 0 refills | Status: DC | PRN

## 2020-03-16 NOTE — Progress Notes (Signed)
Office Visit Note   Patient: [name]           Date of Birth: [birth year]           MRN: [id number] Visit Date: 03/16/2020              Requested by: Antony Contras, MD [place],  [place] [address] PCP: Antony Contras, MD   Assessment & Plan: Visit Diagnoses:  1. Pain in left upper arm   2. Left elbow pain     Plan: I impression is left elbow contusion.  I recommend heat to the bruising and hematoma.  Compression as needed.  Tramadol prescription sent in.  Sling for a week to help with support and then can wean as tolerated.  Follow-up as needed.  Follow-Up Instructions: Return if symptoms worsen or fail to improve.   Orders:  Orders Placed This Encounter  Procedures  . XR Humerus Left  . XR Elbow Complete Left (3+View)   Meds ordered this encounter  Medications  . traMADol (ULTRAM) 50 MG tablet    Sig: Take 1-2 tablets (50-100 mg total) by mouth daily as needed.    Dispense:  20 tablet    Refill:  0      Procedures: No procedures performed   Clinical Data: No additional findings.   Subjective: Chief Complaint  Patient presents with  . Left Upper Arm - Pain    [name] is a very pleasant 86 year old female who is well-known to me who fell about a week ago and cut her left elbow on the corner of a table.  She states that she felt okay until Tuesday when all of a sudden she had significant increase in pain and swelling and bruising.  She is on a blood thinner at baseline.  She is noticed some decreased range of motion of the elbow and she has trouble bringing food to her mouth.   Review of Systems  Constitutional: Negative.   HENT: Negative.   Eyes: Negative.   Respiratory: Negative.   Cardiovascular: Negative.   Endocrine: Negative.   Musculoskeletal: Negative.   Neurological: Negative.   Hematological: Negative.   Psychiatric/Behavioral: Negative.   All other systems reviewed and are negative.    Objective: Vital Signs:  Ht 5\' 3"  (1.6 m)   Wt 95 lb (43.1 kg)   LMP  (LMP Unknown)   BMI 16.83 kg/m   Physical Exam Vitals and nursing note reviewed.  Constitutional:      Appearance: She is well-developed.  Pulmonary:     Effort: Pulmonary effort is normal.  Skin:    General: Skin is warm.     Capillary Refill: Capillary refill takes less than 2 seconds.  Neurological:     Mental Status: She is alert and oriented to person, place, and time.  Psychiatric:        Behavior: Behavior normal.        Thought Content: Thought content normal.        Judgment: Judgment normal.     Ortho Exam Left elbow shows extensive bruising and swelling.  She has some soft tissue tenderness throughout.  No bony crepitus.  Range of motion of the elbow is slightly limited secondary to swelling and pain.  Shoulder exam is unremarkable. Specialty Comments:  No specialty comments available.  Imaging: XR Elbow Complete Left (3+View)  Result Date: 03/16/2020 No acute abnormalities.  XR Humerus Left  Result Date: 03/16/2020 No acute or structural abnormalities  PMFS History: Patient Active Problem List   Diagnosis Date Noted  . History of open reduction and internal fixation (ORIF) procedure 11/15/2018  . Wound dehiscence, surgical 11/04/2018  . Closed fracture of left olecranon process 09/20/2018  . Chronic anticoagulation 02/10/2014  . Pure hypercholesterolemia 02/10/2014  . Encounter for therapeutic drug monitoring 06/15/2013  . Atrioventricular block, complete ([place]) 06/07/2013  . Pacemaker-Medtronic 06/07/2013  . Atrial fibrillation ([place]) 02/24/2013   Past Medical History:  Diagnosis Date  . Ambulates with cane    straight  . Anxiety   . Arthritis    lower back  . CKD (chronic kidney disease), stage III ([place])   . Closed fracture of left olecranon process   . COPD (chronic obstructive pulmonary disease) ([place])   . Depression   . Diabetes mellitus without complication ([place])    type 2  . Headache     . Hypertension   . Orthostatic hypotension   . Osteopenia   . Pacemaker   . 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 Permanent atrial fibrillation ([place])    a.  s/p AVN ablation 2010 with complete heart block s/p PPM (gen change 2015, pt preference for Coumadin).  . Protein calorie malnutrition ([place])   . S/P AV nodal ablation   . TIA (transient ischemic attack)     Family History  Problem Relation Age of Onset  . Stroke Father     Past Surgical History:  Procedure Laterality Date  . ABLATION  2012   AVN ablation by Dr Lovena Le  . cateracts Bilateral    eyes  . CYST REMOVAL TRUNK     from breast  . I & D EXTREMITY Left 11/04/2018   Procedure: IRRIGATION AND DEBRIDEMENT LEFT ELBOW, WOUND VAC;  Surgeon: Leandrew Koyanagi, MD;  Location: [facility];  Service: Orthopedics;  Laterality: Left;  . I & D EXTREMITY Left 11/15/2018   Procedure: IRRIGATION AND DEBRIDEMENT LEFT ELBOW, WOUND VAC;  Surgeon: Leandrew Koyanagi, MD;  Location: [facility];  Service: Orthopedics;  Laterality: Left;  . ORIF ELBOW FRACTURE Left 10/07/2018   Procedure: OPEN REDUCTION INTERNAL FIXATION (ORIF) LEFT OLECRANON FRACTURE;  Surgeon: Leandrew Koyanagi, MD;  Location: [facility];  Service: Orthopedics;  Laterality: Left;  . PACEMAKER INSERTION    . PERMANENT PACEMAKER GENERATOR CHANGE N/A 07/18/2013   Procedure: PERMANENT PACEMAKER GENERATOR CHANGE;  Surgeon: Deboraha Sprang, MD;  Location: [facility] CATH LAB;  Service: Cardiovascular;  Laterality: N/A;  . 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 Smoking status: Current Every Day Smoker    Packs/day: 0.50    Years: 67.00    Pack years: 33.50    Types: Cigarettes  . Smokeless tobacco: Never Used  Vaping Use  . Vaping Use: Never used  Substance and Sexual Activity  . Alcohol use: No    Alcohol/week: 0.0 standard drinks  . Drug use: No  . Sexual activity:  Not on file

## 2020-03-27 ENCOUNTER — Other Ambulatory Visit: Payer: Self-pay | Admitting: Cardiology

## 2020-03-27 NOTE — Telephone Encounter (Signed)
Pt last saw Dr Marlou Porch 01/17/20, last labs 12/02/19 Creat 1.17 at [place], age 86, weight 43.1kg, based on specified criteria pt is on appropriate dosage of Xarelto 15mg  QD.  Will refill rx.

## 2020-04-16 ENCOUNTER — Other Ambulatory Visit: Payer: Self-pay

## 2020-04-16 ENCOUNTER — Telehealth: Payer: Self-pay | Admitting: Podiatry

## 2020-04-16 NOTE — Telephone Encounter (Signed)
Patient called in requesting refill for Lyrica, Please advise

## 2020-04-16 NOTE — Telephone Encounter (Signed)
Pt called again today requesting a refill on her lyrica. Please advise

## 2020-04-17 ENCOUNTER — Other Ambulatory Visit: Payer: Self-pay | Admitting: Podiatry

## 2020-04-17 MED ORDER — PREGABALIN 50 MG PO CAPS: 50.0000 mg | ORAL_CAPSULE | Freq: Every day | ORAL | 3 refills | Status: AC

## 2020-04-17 MED ORDER — PREGABALIN 50 MG PO CAPS: 50.0000 mg | ORAL_CAPSULE | Freq: Every day | ORAL | 5 refills | Status: AC

## 2020-04-17 NOTE — Telephone Encounter (Signed)
Thank you :)

## 2020-04-17 NOTE — Telephone Encounter (Signed)
Meds were sent.

## 2020-05-25 ENCOUNTER — Ambulatory Visit (INDEPENDENT_AMBULATORY_CARE_PROVIDER_SITE_OTHER): Payer: Medicare Other

## 2020-05-25 DIAGNOSIS — I4819 Other persistent atrial fibrillation: Secondary | ICD-10-CM | POA: Diagnosis not present

## 2020-05-26 LAB — CUP PACEART REMOTE DEVICE CHECK
Battery Impedance: 993 Ohm
Battery Remaining Longevity: 61 mo
Battery Voltage: 2.77 V
Brady Statistic RV Percent Paced: 100 %
Date Time Interrogation Session: 20220107114740
Implantable Lead Implant Date: 20080416
Implantable Lead Implant Date: 20080416
Implantable Lead Location: 753859
Implantable Lead Location: 753860
Implantable Lead Model: 4076
Implantable Lead Model: 5076
Implantable Pulse Generator Implant Date: 20150302
Lead Channel Impedance Value: 506 Ohm
Lead Channel Impedance Value: 67 Ohm
Lead Channel Pacing Threshold Amplitude: 0.625 V
Lead Channel Pacing Threshold Pulse Width: 0.4 ms
Lead Channel Setting Pacing Amplitude: 2.5 V
Lead Channel Setting Pacing Pulse Width: 0.4 ms
Lead Channel Setting Sensing Sensitivity: 5.6 mV

## 2020-06-05 DIAGNOSIS — F411 Generalized anxiety disorder: Secondary | ICD-10-CM | POA: Diagnosis not present

## 2020-06-05 DIAGNOSIS — I129 Hypertensive chronic kidney disease with stage 1 through stage 4 chronic kidney disease, or unspecified chronic kidney disease: Secondary | ICD-10-CM | POA: Diagnosis not present

## 2020-06-05 DIAGNOSIS — E039 Hypothyroidism, unspecified: Secondary | ICD-10-CM | POA: Diagnosis not present

## 2020-06-05 DIAGNOSIS — F339 Major depressive disorder, recurrent, unspecified: Secondary | ICD-10-CM | POA: Diagnosis not present

## 2020-06-08 NOTE — Progress Notes (Signed)
Remote pacemaker transmission.   

## 2020-08-14 ENCOUNTER — Encounter: Payer: Self-pay | Admitting: Cardiology

## 2020-08-14 ENCOUNTER — Other Ambulatory Visit: Payer: Self-pay

## 2020-08-14 ENCOUNTER — Ambulatory Visit: Payer: Medicare Other | Admitting: Cardiology

## 2020-08-14 VITALS — BP 140/90 | HR 86 | Ht 63.0 in | Wt 99.0 lb

## 2020-08-14 DIAGNOSIS — I442 Atrioventricular block, complete: Secondary | ICD-10-CM

## 2020-08-14 DIAGNOSIS — Z7901 Long term (current) use of anticoagulants: Secondary | ICD-10-CM

## 2020-08-14 DIAGNOSIS — N184 Chronic kidney disease, stage 4 (severe): Secondary | ICD-10-CM | POA: Diagnosis not present

## 2020-08-14 DIAGNOSIS — I4821 Permanent atrial fibrillation: Secondary | ICD-10-CM | POA: Diagnosis not present

## 2020-08-14 DIAGNOSIS — Z950 Presence of cardiac pacemaker: Secondary | ICD-10-CM | POA: Diagnosis not present

## 2020-08-14 DIAGNOSIS — Z5181 Encounter for therapeutic drug level monitoring: Secondary | ICD-10-CM | POA: Diagnosis not present

## 2020-08-14 NOTE — Patient Instructions (Signed)
Medication Instructions:  The current medical regimen is effective;  continue present plan and medications.  *If you need a refill on your cardiac medications before your next appointment, please call your pharmacy*  Lab Work: Please have blood work today (CBC,CMP) If you have labs (blood work) drawn today and your tests are completely normal, you will receive your results only by: Marland Kitchen MyChart Message (if you have MyChart) OR . A paper copy in the mail If you have any lab test that is abnormal or we need to change your treatment, we will call you to review the results.  Follow-Up: At [facility], you and your health needs are our priority.  As part of our continuing mission to provide you with exceptional heart care, we have created designated Provider Care Teams.  These Care Teams include your primary Cardiologist (physician) and Advanced Practice Providers (APPs -  Physician Assistants and Nurse Practitioners) who all work together to provide you with the care you need, when you need it.  We recommend signing up for the patient portal called "MyChart".  Sign up information is provided on this After Visit Summary.  MyChart is used to connect with patients for Virtual Visits (Telemedicine).  Patients are able to view lab/test results, encounter notes, upcoming appointments, etc.  Non-urgent messages can be sent to your provider as well.   To learn more about what you can do with MyChart, go to [email or web address].    Your next appointment:   6 month(s)  The format for your next appointment:   In Person  Provider:   Candee Furbish, MD   Thank you for choosing [facility]!!

## 2020-08-14 NOTE — Progress Notes (Signed)
Cardiology Office Note:    Date:  08/14/2020   ID:  DAMALI BAGLIO, DOB 1929/01/09, MRN [id number]  PCP:  Antony Contras, MD   [place]  Cardiologist:  Candee Furbish, MD  Advanced Practice Provider:  No care team member to display Electrophysiologist:  None       Referring MD: Antony Contras, MD     History of Present Illness:    [name] is a 87 y.o. female here for the follow-up of atrial fibrillation, pacemaker.  Prior AV junction ablation by Dr. Lovena Le in 2012-pacemaker dependent.  She has battled with disequilibrium at times.  Trying to maintain weight with Carnation instant breakfast.  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 Ambulates with cane    straight  . Anxiety   . Arthritis    lower back  . CKD (chronic kidney disease), stage III ([place])   . Closed fracture of left olecranon process   . COPD (chronic obstructive pulmonary disease) ([place])   . Depression   . Diabetes mellitus without complication ([place])    type 2  . Headache   . Hypertension   . Orthostatic hypotension   . Osteopenia   . Pacemaker   . 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 Permanent atrial fibrillation ([place])    a.  s/p AVN ablation 2010 with complete heart block s/p PPM (gen change 2015, pt preference for Coumadin).  . Protein calorie malnutrition ([place])   . S/P AV nodal ablation   . TIA (transient ischemic attack)     Past Surgical History:  Procedure Laterality Date  . ABLATION  2012   AVN ablation by Dr Lovena Le  . cateracts Bilateral    eyes  . CYST REMOVAL TRUNK     from breast  . I & D EXTREMITY Left 11/04/2018   Procedure: IRRIGATION AND DEBRIDEMENT LEFT ELBOW, WOUND VAC;  Surgeon: Leandrew Koyanagi, MD;  Location: [facility];  Service: Orthopedics;  Laterality: Left;  . I & D EXTREMITY Left 11/15/2018   Procedure: IRRIGATION AND DEBRIDEMENT LEFT ELBOW, WOUND VAC;   Surgeon: Leandrew Koyanagi, MD;  Location: [facility];  Service: Orthopedics;  Laterality: Left;  . ORIF ELBOW FRACTURE Left 10/07/2018   Procedure: OPEN REDUCTION INTERNAL FIXATION (ORIF) LEFT OLECRANON FRACTURE;  Surgeon: Leandrew Koyanagi, MD;  Location: [facility];  Service: Orthopedics;  Laterality: Left;  . PACEMAKER INSERTION    . PERMANENT PACEMAKER GENERATOR CHANGE N/A 07/18/2013   Procedure: PERMANENT PACEMAKER GENERATOR CHANGE;  Surgeon: Deboraha Sprang, MD;  Location: [facility] CATH LAB;  Service: Cardiovascular;  Laterality: N/A;  . WISDOM TOOTH EXTRACTION      Current Medications: Current Meds  Medication Sig  . BD PEN NEEDLE NANO U/F 32G X 4 MM MISC AS DIRECTED USE WITH TOUJEO/ DX E11.49 IN VITRO  . calcium-vitamin D (OSCAL WITH D) 500-200 MG-UNIT TABS tablet TAKE 1 TABLET BY MOUTH THREE TIMES A DAY  . clonazePAM (KLONOPIN) 0.5 MG tablet TAKE 1 TABLET BY MOUTH EVERYDAY AT BEDTIME  . escitalopram (LEXAPRO) 20 MG tablet Take 20 mg by mouth daily.   . Insulin Glargine 300 UNIT/ML SOPN Inject 4 Units into the skin daily.   Marland Kitchen levothyroxine (SYNTHROID) 50 MCG tablet Take 50 mcg by mouth every morning.  Marland Kitchen losartan (COZAAR) 50 MG tablet Take 100 mg by mouth daily.  . magic  mouthwash SOLN Take 5 mLs by mouth 3 (three) times daily.   . Melatonin 5 MG TABS Take 5 mg by mouth at bedtime.   . mirtazapine (REMERON) 30 MG tablet Take 30 mg by mouth at bedtime.  . Multiple Vitamin (MULTIVITAMIN) tablet Take 1 tablet by mouth daily.  . pregabalin (LYRICA) 50 MG capsule Take 1 capsule (50 mg total) by mouth at bedtime.  . pregabalin (LYRICA) 50 MG capsule Take 1 capsule (50 mg total) by mouth daily. Take one capsule (50 mg total) by mouth daily  . PREVIDENT 5000 DRY MOUTH 1.1 % GEL dental gel Take 1 application by mouth See admin instructions. Use to brush teeth every day, replacement for regular toothpaste  . sulfamethoxazole-trimethoprim (BACTRIM DS) 800-160 MG tablet Take 1 tablet by mouth 2 (two)  times daily.  . traMADol (ULTRAM) 50 MG tablet Take 1-2 tablets (50-100 mg total) by mouth daily as needed.  Alveda Reasons 15 MG TABS tablet TAKE 1 TABLET (15 MG TOTAL) BY MOUTH DAILY WITH SUPPER.     Allergies:   Eliquis [apixaban], Gabapentin, Rozerem [ramelteon], Tikosyn [dofetilide], Tradjenta [linagliptin], and Temazepam   Social History   Socioeconomic History  . Marital status: Married    Spouse name: [name]  . Number of children: 0  . Years of education: 31  . 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 Smoking status: Current Every Day Smoker    Packs/day: 0.50    Years: 67.00    Pack years: 33.50    Types: Cigarettes  . Smokeless tobacco: Never Used  Vaping Use  . Vaping Use: Never used  Substance and Sexual Activity  . Alcohol use: No    Alcohol/week: 0.0 standard drinks  . Drug use: No  . Sexual activity: Not on file  Other Topics Concern  . Not on file  Social History Narrative   Lives at home with husband.  Education PHDD.  No children.     Social Determinants of Health   Financial Resource Strain: Not on file  Food Insecurity: Not on file  Transportation Needs: Not on file  Physical Activity: Not on file  Stress: Not on file  Social Connections: Not on file     Family History: The patient's family history includes Stroke in her father.  ROS:   Please see the history of present illness.     All other systems reviewed and are negative.  EKGs/Labs/Other Studies Reviewed:    The following studies were reviewed today:  Echo 2018: -EF 65% mildly calcified mitral annulus, mild mitral regurgitation, mildly dilated left atrium, right atrium was severely dilated, PA pressures 25 mmHg.  EKG:  EKG is  ordered today.  The ekg ordered today demonstrates AFIB  Prior EKG 07/25/2019 shows atrial fibrillation/flutter with pacemaker backup.  Recent Labs: No results found for requested labs within last  8760 hours.  Recent Lipid Panel    Component Value Date/Time   CHOL  07/08/2010 0612    89        ATP III CLASSIFICATION:  <200     mg/dL   Desirable  200-239  mg/dL   Borderline High  >=240    mg/dL   High          TRIG 99 07/08/2010 0612   HDL 35 (L) 07/08/2010 0612   CHOLHDL 2.5 07/08/2010 0612   VLDL 20 07/08/2010 0612   LDLCALC  07/08/2010 [id number]  34        Total Cholesterol/HDL:CHD Risk Coronary Heart Disease Risk Table                     Men   Women  1/2 Average Risk   3.4   3.3  Average Risk       5.0   4.4  2 X Average Risk   9.6   7.1  3 X Average Risk  23.4   11.0        Use the calculated Patient Ratio above and the CHD Risk Table to determine the patient's CHD Risk.        ATP III CLASSIFICATION (LDL):  <100     mg/dL   Optimal  100-129  mg/dL   Near or Above                    Optimal  130-159  mg/dL   Borderline  160-189  mg/dL   High  >190     mg/dL   Very High     Risk Assessment/Calculations:      Physical Exam:    VS:  BP 140/90 (BP Location: Left Arm, Patient Position: Sitting, Cuff Size: Normal)   Pulse 86   Ht '5\' 3"'$  (1.6 m)   Wt 99 lb (44.9 kg)   LMP  (LMP Unknown)   SpO2 95%   BMI 17.54 kg/m     Wt Readings from Last 3 Encounters:  08/14/20 99 lb (44.9 kg)  03/16/20 95 lb (43.1 kg)  01/17/20 92 lb 9.6 oz (42 kg)     GEN:  Well nourished, well developed in no acute distress HEENT: Normal NECK: No JVD; No carotid bruits LYMPHATICS: No lymphadenopathy CARDIAC: RRR, no murmurs, rubs, gallops RESPIRATORY:  Clear to auscultation without rales, wheezing or rhonchi  ABDOMEN: Soft, non-tender, non-distended MUSCULOSKELETAL:  No edema; No deformity  SKIN: Warm and dry NEUROLOGIC:  Alert and oriented x 3 PSYCHIATRIC:  Normal affect   ASSESSMENT:    1. Permanent atrial fibrillation ([place])   2. Pacemaker   3. Chronic anticoagulation   4. CKD (chronic kidney disease) stage 4, GFR 15-29 ml/min (HCC)   5. Complete heart block ([place])    6. Encounter for therapeutic drug monitoring    PLAN:    In order of problems listed above:  Permanent atrial fibrillation -Has pacemaker for backup rate remember she is pacemaker dependent secondary to AV nodal ablation. -Doing well  Permanent pacemaker Medtronic -Functioning well.  AV nodal ablation, pacemaker dependent. Dr. Caryl Comes.  Chronic anticoagulation -Xarelto has been dose adjusted to 15 mg given her creatinine/GFR. Eliquis affected her legs, weak.   Chronic kidney disease stage IV -Trying to avoid NSAIDs.  She is on losartan for renal protection.  Diabetes -Prior hemoglobin A1c 6.8.  Insulin.  We will go ahead and check a complete metabolic profile and a CBC today.  She is on Xarelto.    Medication Adjustments/Labs and Tests Ordered: Current medicines are reviewed at length with the patient today.  Concerns regarding medicines are outlined above.  Orders Placed This Encounter  Procedures  . CBC  . Comprehensive metabolic panel  . EKG 12-Lead   No orders of the defined types were placed in this encounter.   Patient Instructions  Medication Instructions:  The current medical regimen is effective;  continue present plan and medications.  *If you need a refill on your cardiac medications before your next appointment, please call  your pharmacy*  Lab Work: Please have blood work today (CBC,CMP) If you have labs (blood work) drawn today and your tests are completely normal, you will receive your results only by: Marland Kitchen MyChart Message (if you have MyChart) OR . A paper copy in the mail If you have any lab test that is abnormal or we need to change your treatment, we will call you to review the results.  Follow-Up: At [facility], you and your health needs are our priority.  As part of our continuing mission to provide you with exceptional heart care, we have created designated Provider Care Teams.  These Care Teams include your primary Cardiologist (physician) and  Advanced Practice Providers (APPs -  Physician Assistants and Nurse Practitioners) who all work together to provide you with the care you need, when you need it.  We recommend signing up for the patient portal called "MyChart".  Sign up information is provided on this After Visit Summary.  MyChart is used to connect with patients for Virtual Visits (Telemedicine).  Patients are able to view lab/test results, encounter notes, upcoming appointments, etc.  Non-urgent messages can be sent to your provider as well.   To learn more about what you can do with MyChart, go to [email or web address].    Your next appointment:   6 month(s)  The format for your next appointment:   In Person  Provider:   Candee Furbish, MD   Thank you for choosing [facility]!!        Signed, Candee Furbish, MD  08/14/2020 2:08 PM    [place]

## 2020-08-15 LAB — COMPREHENSIVE METABOLIC PANEL
ALT: 16 IU/L (ref 0–32)
AST: 21 IU/L (ref 0–40)
Albumin/Globulin Ratio: 1.8 (ref 1.2–2.2)
Albumin: 4.2 g/dL (ref 3.6–4.6)
Alkaline Phosphatase: 135 IU/L — ABNORMAL HIGH (ref 44–121)
BUN/Creatinine Ratio: 15 (ref 12–28)
BUN: 19 mg/dL (ref 8–27)
Bilirubin Total: 0.4 mg/dL (ref 0.0–1.2)
CO2: 23 mmol/L (ref 20–29)
Calcium: 9.6 mg/dL (ref 8.7–10.3)
Chloride: 97 mmol/L (ref 96–106)
Creatinine, Ser: 1.26 mg/dL — ABNORMAL HIGH (ref 0.57–1.00)
Globulin, Total: 2.3 g/dL (ref 1.5–4.5)
Glucose: 144 mg/dL — ABNORMAL HIGH (ref 65–99)
Potassium: 4.7 mmol/L (ref 3.5–5.2)
Sodium: 140 mmol/L (ref 134–144)
Total Protein: 6.5 g/dL (ref 6.0–8.5)
eGFR: 41 mL/min/{1.73_m2} — ABNORMAL LOW (ref >59)

## 2020-08-15 LAB — CBC
Hematocrit: 45.2 % (ref 34.0–46.6)
Hemoglobin: 15.3 g/dL (ref 11.1–15.9)
MCH: 31.2 pg (ref 26.6–33.0)
MCHC: 33.8 g/dL (ref 31.5–35.7)
MCV: 92 fL (ref 79–97)
Platelets: 198 10*3/uL (ref 150–450)
RBC: 4.9 x10E6/uL (ref 3.77–5.28)
RDW: 12.7 % (ref 11.7–15.4)
WBC: 8.4 10*3/uL (ref 3.4–10.8)

## 2020-08-20 ENCOUNTER — Other Ambulatory Visit: Payer: Self-pay

## 2020-08-20 ENCOUNTER — Encounter: Payer: Self-pay | Admitting: Internal Medicine

## 2020-08-20 ENCOUNTER — Ambulatory Visit: Payer: Medicare Other | Admitting: Internal Medicine

## 2020-08-20 VITALS — BP 148/88 | HR 88 | Ht 63.0 in | Wt 101.6 lb

## 2020-08-20 DIAGNOSIS — I442 Atrioventricular block, complete: Secondary | ICD-10-CM | POA: Diagnosis not present

## 2020-08-20 DIAGNOSIS — I4821 Permanent atrial fibrillation: Secondary | ICD-10-CM | POA: Diagnosis not present

## 2020-08-20 DIAGNOSIS — Z950 Presence of cardiac pacemaker: Secondary | ICD-10-CM | POA: Diagnosis not present

## 2020-08-20 NOTE — Patient Instructions (Signed)

## 2020-08-20 NOTE — Progress Notes (Signed)
Patient Care Team: Antony Contras, MD as PCP - General (Family Medicine) [name], MD as PCP - Cardiology (Cardiology)   HPI  [name] is a 87 y.o. 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 2012 underwent AV junction ablation by Dr. Lovena Le.  The patient denies chest pain, shortness of breath, nocturnal dyspnea, orthopnea or peripheral edema.  There have been no palpitations, lightheadedness or syncope. " I am doing great for 89" tired in the afternoons.  On Anticoagulation;  No bleeding issues    DATE TEST EF   9/18 Echo   60-65 %         Date Cr K Hgb  9/20 1.59 3.9<<3.2 13.9  3/22 1.26 4.7 [id number]     Thromboembolic risk profile includes prior TIA-2, hypertension-1, diabetes-1, gender-1, age-57 for a CHADS-VASc score of 7  She has been on coumadin with prior discussions re NOAC      Past Medical History:  Diagnosis Date  . Ambulates with cane    straight  . Anxiety   . Arthritis    lower back  . CKD (chronic kidney disease), stage III ([place])   . Closed fracture of left olecranon process   . COPD (chronic obstructive pulmonary disease) ([place])   . Depression   . Diabetes mellitus without complication ([place])    type 2  . Headache   . Hypertension   . Orthostatic hypotension   . Osteopenia   . Pacemaker   . 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 Permanent atrial fibrillation ([place])    a.  s/p AVN ablation 2010 with complete heart block s/p PPM (gen change 2015, pt preference for Coumadin).  . Protein calorie malnutrition ([place])   . S/P AV nodal ablation   . TIA (transient ischemic attack)     Past Surgical History:  Procedure Laterality Date  . ABLATION  2012   AVN ablation by Dr Lovena Le  . cateracts Bilateral    eyes  . CYST REMOVAL TRUNK     from breast  . I & D EXTREMITY Left 11/04/2018   Procedure: IRRIGATION AND DEBRIDEMENT LEFT ELBOW, WOUND VAC;   Surgeon: Leandrew Koyanagi, MD;  Location: [facility];  Service: Orthopedics;  Laterality: Left;  . I & D EXTREMITY Left 11/15/2018   Procedure: IRRIGATION AND DEBRIDEMENT LEFT ELBOW, WOUND VAC;  Surgeon: Leandrew Koyanagi, MD;  Location: [facility];  Service: Orthopedics;  Laterality: Left;  . ORIF ELBOW FRACTURE Left 10/07/2018   Procedure: OPEN REDUCTION INTERNAL FIXATION (ORIF) LEFT OLECRANON FRACTURE;  Surgeon: Leandrew Koyanagi, MD;  Location: [facility];  Service: Orthopedics;  Laterality: Left;  . PACEMAKER INSERTION    . PERMANENT PACEMAKER GENERATOR CHANGE N/A 07/18/2013   Procedure: PERMANENT PACEMAKER GENERATOR CHANGE;  Surgeon: Deboraha Sprang, MD;  Location: [facility] CATH LAB;  Service: Cardiovascular;  Laterality: N/A;  . WISDOM TOOTH EXTRACTION      Current Outpatient Medications  Medication Sig Dispense Refill  . BD PEN NEEDLE NANO U/F 32G X 4 MM MISC AS DIRECTED USE WITH TOUJEO/ DX E11.49 IN VITRO  2  . calcium-vitamin D (OSCAL WITH D) 500-200 MG-UNIT TABS tablet TAKE 1 TABLET BY MOUTH THREE TIMES A DAY 270 tablet 2  . clonazePAM (KLONOPIN) 0.5 MG tablet TAKE 1 TABLET BY MOUTH EVERYDAY AT BEDTIME    . escitalopram (LEXAPRO) 20 MG tablet  Take 20 mg by mouth daily.   0  . Insulin Glargine 300 UNIT/ML SOPN Inject 4 Units into the skin daily.     Marland Kitchen levothyroxine (SYNTHROID) 50 MCG tablet Take 50 mcg by mouth every morning.    Marland Kitchen losartan (COZAAR) 50 MG tablet Take 100 mg by mouth daily.    . Melatonin 5 MG TABS Take 5 mg by mouth at bedtime.     . mirtazapine (REMERON) 30 MG tablet Take 30 mg by mouth at bedtime.    . Multiple Vitamin (MULTIVITAMIN) tablet Take 1 tablet by mouth daily.    . pregabalin (LYRICA) 50 MG capsule Take 1 capsule (50 mg total) by mouth at bedtime. 30 capsule 5  . pregabalin (LYRICA) 50 MG capsule Take 1 capsule (50 mg total) by mouth daily. Take one capsule (50 mg total) by mouth daily 90 capsule 3  . PREVIDENT 5000 DRY MOUTH 1.1 % GEL dental gel Take 1 application by  mouth See admin instructions. Use to brush teeth every day, replacement for regular toothpaste    . XARELTO 15 MG TABS tablet TAKE 1 TABLET (15 MG TOTAL) BY MOUTH DAILY WITH SUPPER. 90 tablet 1   No current facility-administered medications for this visit.    Allergies  Allergen Reactions  . Eliquis [Apixaban] Other (See Comments)    Made pt have weak muscles  . Gabapentin Nausea Only and Other (See Comments)    SHAKES AND NAUSEA   . Rozerem [Ramelteon] Other (See Comments)    DIZZY AND HEAD THROBBING  . Tikosyn [Dofetilide] Itching and Swelling    Mouth swells, itching  . Tradjenta [Linagliptin] Diarrhea and Other (See Comments)    Joint Pain, Stomach issues  . Temazepam Other (See Comments)    INSOMNIA    Review of Systems negative except from HPI and PMH  Physical Exam BP (!) 148/88   Pulse 88   Ht '5\' 3"'$  (1.6 m)   Wt 101 lb 9.6 oz (46.1 kg)   LMP  (LMP Unknown)   SpO2 96%   BMI 18.00 kg/m  Well developed and nourished in no acute distress HENT normal Neck supple with JVP-flat Carotids brisk and full without bruits Clear Regular rate and rhythm Abd-soft with active BS without hepatomegaly No Clubbing cyanosis edema Skin-warm and dry A & Oriented  Grossly normal sensory and motor function  ECG atrial fibrillation with underlying ventricular pacing at 83 Assessment and  Plan  Atrial fibrillation-permanent   Complete heart block s/p AV ablation  Hypertension   Renal insufficiency grade 3-4   Pacemaker Medtronic    On Anticoagulation;  No bleeding issues   Device function normal

## 2020-08-24 ENCOUNTER — Telehealth: Payer: Self-pay | Admitting: Internal Medicine

## 2020-08-24 ENCOUNTER — Ambulatory Visit (INDEPENDENT_AMBULATORY_CARE_PROVIDER_SITE_OTHER): Payer: Medicare Other

## 2020-08-24 DIAGNOSIS — I442 Atrioventricular block, complete: Secondary | ICD-10-CM

## 2020-08-24 NOTE — Telephone Encounter (Signed)
Patient states she was having a hard time sending a transmission. I transferred the patient to the tech support. She just wanted to let the office know in case the transmission does not go through

## 2020-08-24 NOTE — Telephone Encounter (Signed)
I let the patient know we got her message that her monitor was not working. She spoke with tech support and they are sending her a new monitor in 7-10 business days.

## 2020-08-27 LAB — CUP PACEART REMOTE DEVICE CHECK
Battery Impedance: 1099 Ohm
Battery Remaining Longevity: 58 mo
Battery Voltage: 2.77 V
Brady Statistic RV Percent Paced: 100 %
Date Time Interrogation Session: 20220411100528
Implantable Lead Implant Date: 20080416
Implantable Lead Implant Date: 20080416
Implantable Lead Location: 753859
Implantable Lead Location: 753860
Implantable Lead Model: 4076
Implantable Lead Model: 5076
Implantable Pulse Generator Implant Date: 20150302
Lead Channel Impedance Value: 534 Ohm
Lead Channel Impedance Value: 67 Ohm
Lead Channel Pacing Threshold Amplitude: 0.625 V
Lead Channel Pacing Threshold Pulse Width: 0.4 ms
Lead Channel Setting Pacing Amplitude: 2.5 V
Lead Channel Setting Pacing Pulse Width: 0.4 ms
Lead Channel Setting Sensing Sensitivity: 5.6 mV

## 2020-08-28 DIAGNOSIS — H353131 Nonexudative age-related macular degeneration, bilateral, early dry stage: Secondary | ICD-10-CM | POA: Diagnosis not present

## 2020-08-28 DIAGNOSIS — Z961 Presence of intraocular lens: Secondary | ICD-10-CM | POA: Diagnosis not present

## 2020-08-28 DIAGNOSIS — H43813 Vitreous degeneration, bilateral: Secondary | ICD-10-CM | POA: Diagnosis not present

## 2020-08-28 DIAGNOSIS — E119 Type 2 diabetes mellitus without complications: Secondary | ICD-10-CM | POA: Diagnosis not present

## 2020-09-06 NOTE — Progress Notes (Signed)
Remote pacemaker transmission.   

## 2020-09-10 ENCOUNTER — Other Ambulatory Visit: Payer: Self-pay

## 2020-09-10 ENCOUNTER — Emergency Department (HOSPITAL_COMMUNITY): Payer: Medicare Other

## 2020-09-10 ENCOUNTER — Encounter (HOSPITAL_COMMUNITY): Payer: Self-pay

## 2020-09-10 ENCOUNTER — Observation Stay (HOSPITAL_COMMUNITY): Payer: Medicare Other

## 2020-09-10 ENCOUNTER — Inpatient Hospital Stay (HOSPITAL_COMMUNITY)
Admission: EM | Admit: 2020-09-10 | Discharge: 2020-09-16 | DRG: 871 | Disposition: E | Payer: Medicare Other | Attending: Family Medicine | Admitting: Family Medicine

## 2020-09-10 DIAGNOSIS — Z823 Family history of stroke: Secondary | ICD-10-CM

## 2020-09-10 DIAGNOSIS — J189 Pneumonia, unspecified organism: Secondary | ICD-10-CM

## 2020-09-10 DIAGNOSIS — I5032 Chronic diastolic (congestive) heart failure: Secondary | ICD-10-CM | POA: Diagnosis present

## 2020-09-10 DIAGNOSIS — I700 Atherosclerosis of aorta: Secondary | ICD-10-CM | POA: Diagnosis present

## 2020-09-10 DIAGNOSIS — R6520 Severe sepsis without septic shock: Secondary | ICD-10-CM | POA: Diagnosis present

## 2020-09-10 DIAGNOSIS — E875 Hyperkalemia: Secondary | ICD-10-CM | POA: Diagnosis present

## 2020-09-10 DIAGNOSIS — Z7984 Long term (current) use of oral hypoglycemic drugs: Secondary | ICD-10-CM

## 2020-09-10 DIAGNOSIS — Z9889 Other specified postprocedural states: Secondary | ICD-10-CM

## 2020-09-10 DIAGNOSIS — Z79890 Hormone replacement therapy (postmenopausal): Secondary | ICD-10-CM

## 2020-09-10 DIAGNOSIS — A419 Sepsis, unspecified organism: Secondary | ICD-10-CM | POA: Diagnosis not present

## 2020-09-10 DIAGNOSIS — J90 Pleural effusion, not elsewhere classified: Secondary | ICD-10-CM | POA: Diagnosis not present

## 2020-09-10 DIAGNOSIS — R1310 Dysphagia, unspecified: Secondary | ICD-10-CM

## 2020-09-10 DIAGNOSIS — Z888 Allergy status to other drugs, medicaments and biological substances status: Secondary | ICD-10-CM

## 2020-09-10 DIAGNOSIS — N183 Chronic kidney disease, stage 3 unspecified: Secondary | ICD-10-CM

## 2020-09-10 DIAGNOSIS — J9601 Acute respiratory failure with hypoxia: Secondary | ICD-10-CM | POA: Diagnosis not present

## 2020-09-10 DIAGNOSIS — J918 Pleural effusion in other conditions classified elsewhere: Secondary | ICD-10-CM | POA: Diagnosis present

## 2020-09-10 DIAGNOSIS — Z515 Encounter for palliative care: Secondary | ICD-10-CM

## 2020-09-10 DIAGNOSIS — Z950 Presence of cardiac pacemaker: Secondary | ICD-10-CM | POA: Diagnosis present

## 2020-09-10 DIAGNOSIS — F419 Anxiety disorder, unspecified: Secondary | ICD-10-CM | POA: Diagnosis present

## 2020-09-10 DIAGNOSIS — I2510 Atherosclerotic heart disease of native coronary artery without angina pectoris: Secondary | ICD-10-CM | POA: Diagnosis present

## 2020-09-10 DIAGNOSIS — Z20822 Contact with and (suspected) exposure to covid-19: Secondary | ICD-10-CM | POA: Diagnosis present

## 2020-09-10 DIAGNOSIS — F32A Depression, unspecified: Secondary | ICD-10-CM | POA: Diagnosis present

## 2020-09-10 DIAGNOSIS — J449 Chronic obstructive pulmonary disease, unspecified: Secondary | ICD-10-CM | POA: Diagnosis present

## 2020-09-10 DIAGNOSIS — F17210 Nicotine dependence, cigarettes, uncomplicated: Secondary | ICD-10-CM | POA: Diagnosis present

## 2020-09-10 DIAGNOSIS — Z9842 Cataract extraction status, left eye: Secondary | ICD-10-CM

## 2020-09-10 DIAGNOSIS — M25511 Pain in right shoulder: Secondary | ICD-10-CM | POA: Diagnosis present

## 2020-09-10 DIAGNOSIS — Z79899 Other long term (current) drug therapy: Secondary | ICD-10-CM

## 2020-09-10 DIAGNOSIS — I495 Sick sinus syndrome: Secondary | ICD-10-CM | POA: Diagnosis present

## 2020-09-10 DIAGNOSIS — Z794 Long term (current) use of insulin: Secondary | ICD-10-CM

## 2020-09-10 DIAGNOSIS — Z9841 Cataract extraction status, right eye: Secondary | ICD-10-CM

## 2020-09-10 DIAGNOSIS — R0602 Shortness of breath: Secondary | ICD-10-CM

## 2020-09-10 DIAGNOSIS — Z634 Disappearance and death of family member: Secondary | ICD-10-CM

## 2020-09-10 DIAGNOSIS — N179 Acute kidney failure, unspecified: Secondary | ICD-10-CM | POA: Diagnosis not present

## 2020-09-10 DIAGNOSIS — I130 Hypertensive heart and chronic kidney disease with heart failure and stage 1 through stage 4 chronic kidney disease, or unspecified chronic kidney disease: Secondary | ICD-10-CM | POA: Diagnosis present

## 2020-09-10 DIAGNOSIS — G9341 Metabolic encephalopathy: Secondary | ICD-10-CM

## 2020-09-10 DIAGNOSIS — E871 Hypo-osmolality and hyponatremia: Secondary | ICD-10-CM | POA: Diagnosis present

## 2020-09-10 DIAGNOSIS — N1831 Chronic kidney disease, stage 3a: Secondary | ICD-10-CM | POA: Diagnosis present

## 2020-09-10 DIAGNOSIS — E039 Hypothyroidism, unspecified: Secondary | ICD-10-CM | POA: Diagnosis present

## 2020-09-10 DIAGNOSIS — Z66 Do not resuscitate: Secondary | ICD-10-CM | POA: Diagnosis present

## 2020-09-10 DIAGNOSIS — Z8673 Personal history of transient ischemic attack (TIA), and cerebral infarction without residual deficits: Secondary | ICD-10-CM

## 2020-09-10 DIAGNOSIS — R54 Age-related physical debility: Secondary | ICD-10-CM | POA: Diagnosis present

## 2020-09-10 DIAGNOSIS — R0902 Hypoxemia: Secondary | ICD-10-CM

## 2020-09-10 DIAGNOSIS — E1122 Type 2 diabetes mellitus with diabetic chronic kidney disease: Secondary | ICD-10-CM | POA: Diagnosis present

## 2020-09-10 DIAGNOSIS — J9811 Atelectasis: Secondary | ICD-10-CM | POA: Diagnosis present

## 2020-09-10 DIAGNOSIS — J690 Pneumonitis due to inhalation of food and vomit: Secondary | ICD-10-CM | POA: Diagnosis present

## 2020-09-10 DIAGNOSIS — I4821 Permanent atrial fibrillation: Secondary | ICD-10-CM

## 2020-09-10 DIAGNOSIS — Z7901 Long term (current) use of anticoagulants: Secondary | ICD-10-CM

## 2020-09-10 DIAGNOSIS — J869 Pyothorax without fistula: Secondary | ICD-10-CM

## 2020-09-10 DIAGNOSIS — R0600 Dyspnea, unspecified: Secondary | ICD-10-CM | POA: Diagnosis present

## 2020-09-10 DIAGNOSIS — Z9689 Presence of other specified functional implants: Secondary | ICD-10-CM

## 2020-09-10 LAB — PROTIME-INR
INR: 2.3 — ABNORMAL HIGH (ref 0.8–1.2)
Prothrombin Time: 25.3 seconds — ABNORMAL HIGH (ref 11.4–15.2)

## 2020-09-10 LAB — COMPREHENSIVE METABOLIC PANEL
ALT: 22 U/L (ref 0–44)
AST: 27 U/L (ref 15–41)
Albumin: 3.1 g/dL — ABNORMAL LOW (ref 3.5–5.0)
Alkaline Phosphatase: 104 U/L (ref 38–126)
Anion gap: 11 (ref 5–15)
BUN: 29 mg/dL — ABNORMAL HIGH (ref 8–23)
CO2: 26 mmol/L (ref 22–32)
Calcium: 9.4 mg/dL (ref 8.9–10.3)
Chloride: 87 mmol/L — ABNORMAL LOW (ref 98–111)
Creatinine, Ser: 1.56 mg/dL — ABNORMAL HIGH (ref 0.44–1.00)
GFR, Estimated: 32 mL/min — ABNORMAL LOW (ref >60)
Glucose, Bld: 183 mg/dL — ABNORMAL HIGH (ref 70–99)
Potassium: 5.4 mmol/L — ABNORMAL HIGH (ref 3.5–5.1)
Sodium: 124 mmol/L — ABNORMAL LOW (ref 135–145)
Total Bilirubin: 1 mg/dL (ref 0.3–1.2)
Total Protein: 6.8 g/dL (ref 6.5–8.1)

## 2020-09-10 LAB — CBC WITH DIFFERENTIAL/PLATELET
Abs Immature Granulocytes: 1.28 10*3/uL — ABNORMAL HIGH (ref 0.00–0.07)
Basophils Absolute: 0.2 10*3/uL — ABNORMAL HIGH (ref 0.0–0.1)
Basophils Relative: 1 %
Eosinophils Absolute: 0 10*3/uL (ref 0.0–0.5)
Eosinophils Relative: 0 %
HCT: 42.2 % (ref 36.0–46.0)
Hemoglobin: 13.8 g/dL (ref 12.0–15.0)
Immature Granulocytes: 3 %
Lymphocytes Relative: 2 %
Lymphs Abs: 0.7 10*3/uL (ref 0.7–4.0)
MCH: 31.2 pg (ref 26.0–34.0)
MCHC: 32.7 g/dL (ref 30.0–36.0)
MCV: 95.5 fL (ref 80.0–100.0)
Monocytes Absolute: 1.5 10*3/uL — ABNORMAL HIGH (ref 0.1–1.0)
Monocytes Relative: 4 %
Neutro Abs: 35.4 10*3/uL — ABNORMAL HIGH (ref 1.7–7.7)
Neutrophils Relative %: 90 %
Platelets: 203 10*3/uL (ref 150–400)
RBC: 4.42 MIL/uL (ref 3.87–5.11)
RDW: 13.8 % (ref 11.5–15.5)
WBC: 39 10*3/uL — ABNORMAL HIGH (ref 4.0–10.5)
nRBC: 0 % (ref 0.0–0.2)

## 2020-09-10 LAB — URINALYSIS, ROUTINE W REFLEX MICROSCOPIC
Bacteria, UA: NONE SEEN
Bilirubin Urine: NEGATIVE
Glucose, UA: NEGATIVE mg/dL
Hgb urine dipstick: NEGATIVE
Ketones, ur: NEGATIVE mg/dL
Leukocytes,Ua: NEGATIVE
Nitrite: NEGATIVE
Protein, ur: 30 mg/dL — AB
Specific Gravity, Urine: 1.024 (ref 1.005–1.030)
pH: 5 (ref 5.0–8.0)

## 2020-09-10 LAB — HEMOGLOBIN A1C
Hgb A1c MFr Bld: 7.2 % — ABNORMAL HIGH (ref 4.8–5.6)
Mean Plasma Glucose: 159.94 mg/dL

## 2020-09-10 LAB — RESP PANEL BY RT-PCR (FLU A&B, COVID) ARPGX2
Influenza A by PCR: NEGATIVE
Influenza B by PCR: NEGATIVE
SARS Coronavirus 2 by RT PCR: NEGATIVE

## 2020-09-10 LAB — LIPASE, BLOOD: Lipase: 20 U/L (ref 11–51)

## 2020-09-10 LAB — TROPONIN I (HIGH SENSITIVITY): Troponin I (High Sensitivity): 11 ng/L (ref <18)

## 2020-09-10 LAB — TSH: TSH: 1.618 u[IU]/mL (ref 0.350–4.500)

## 2020-09-10 LAB — BRAIN NATRIURETIC PEPTIDE: B Natriuretic Peptide: 326.6 pg/mL — ABNORMAL HIGH (ref 0.0–100.0)

## 2020-09-10 LAB — GLUCOSE, CAPILLARY: Glucose-Capillary: 150 mg/dL — ABNORMAL HIGH (ref 70–99)

## 2020-09-10 LAB — LACTIC ACID, PLASMA: Lactic Acid, Venous: 1.7 mmol/L (ref 0.5–1.9)

## 2020-09-10 LAB — CBG MONITORING, ED: Glucose-Capillary: 177 mg/dL — ABNORMAL HIGH (ref 70–99)

## 2020-09-10 MED ORDER — ACETAMINOPHEN 325 MG PO TABS: 650.0000 mg | ORAL_TABLET | Freq: Four times a day (QID) | ORAL | Status: DC | PRN

## 2020-09-10 MED ORDER — CLONAZEPAM 0.5 MG PO TABS
0.5000 mg | ORAL_TABLET | Freq: Every evening | ORAL | Status: DC | PRN
  Administered 2020-09-10: 0.5 mg via ORAL
  Filled 2020-09-10: qty 1

## 2020-09-10 MED ORDER — ACETAMINOPHEN 650 MG RE SUPP: 650.0000 mg | Freq: Four times a day (QID) | RECTAL | Status: DC | PRN

## 2020-09-10 MED ORDER — SODIUM CHLORIDE 0.9 % IV SOLN
500.0000 mg | INTRAVENOUS | Status: DC
  Administered 2020-09-10 – 2020-09-11 (×2): 500 mg via INTRAVENOUS
  Filled 2020-09-10 (×3): qty 500

## 2020-09-10 MED ORDER — LEVOTHYROXINE SODIUM 50 MCG PO TABS
50.0000 ug | ORAL_TABLET | Freq: Every day | ORAL | Status: DC
  Administered 2020-09-11: 50 ug via ORAL
  Filled 2020-09-10: qty 1

## 2020-09-10 MED ORDER — RIVAROXABAN 15 MG PO TABS: 15.0000 mg | ORAL_TABLET | Freq: Every day | ORAL | Status: DC

## 2020-09-10 MED ORDER — SODIUM CHLORIDE 0.9% FLUSH: 3.0000 mL | INTRAVENOUS | Status: DC | PRN

## 2020-09-10 MED ORDER — INSULIN ASPART 100 UNIT/ML ~~LOC~~ SOLN
0.0000 [IU] | Freq: Three times a day (TID) | SUBCUTANEOUS | Status: DC
  Administered 2020-09-10: 2 [IU] via SUBCUTANEOUS
  Administered 2020-09-11: 3 [IU] via SUBCUTANEOUS
  Administered 2020-09-11 – 2020-09-12 (×2): 2 [IU] via SUBCUTANEOUS
  Administered 2020-09-12: 3 [IU] via SUBCUTANEOUS
  Administered 2020-09-12 – 2020-09-13 (×2): 2 [IU] via SUBCUTANEOUS
  Administered 2020-09-13: 1 [IU] via SUBCUTANEOUS
  Administered 2020-09-13 – 2020-09-14 (×3): 2 [IU] via SUBCUTANEOUS
  Filled 2020-09-10: qty 0.09

## 2020-09-10 MED ORDER — LACTATED RINGERS IV BOLUS (SEPSIS): 1000.0000 mL | Freq: Once | INTRAVENOUS | Status: DC

## 2020-09-10 MED ORDER — ONDANSETRON HCL 4 MG/2ML IJ SOLN
4.0000 mg | Freq: Four times a day (QID) | INTRAMUSCULAR | Status: DC | PRN
  Administered 2020-09-10 – 2020-09-12 (×4): 4 mg via INTRAVENOUS
  Filled 2020-09-10 (×5): qty 2

## 2020-09-10 MED ORDER — SODIUM CHLORIDE 0.9 % IV SOLN
2.0000 g | INTRAVENOUS | Status: DC
  Administered 2020-09-10: 2 g via INTRAVENOUS
  Filled 2020-09-10 (×2): qty 20

## 2020-09-10 MED ORDER — LACTATED RINGERS IV BOLUS (SEPSIS)
500.0000 mL | Freq: Once | INTRAVENOUS | Status: AC
  Administered 2020-09-10: 500 mL via INTRAVENOUS

## 2020-09-10 MED ORDER — SODIUM CHLORIDE 0.9% FLUSH
3.0000 mL | Freq: Two times a day (BID) | INTRAVENOUS | Status: DC
  Administered 2020-09-10 – 2020-09-15 (×7): 3 mL via INTRAVENOUS

## 2020-09-10 MED ORDER — ONDANSETRON HCL 4 MG PO TABS
4.0000 mg | ORAL_TABLET | Freq: Four times a day (QID) | ORAL | Status: DC | PRN
  Filled 2020-09-10: qty 1

## 2020-09-10 MED ORDER — FUROSEMIDE 10 MG/ML IJ SOLN
40.0000 mg | Freq: Once | INTRAMUSCULAR | Status: AC
  Administered 2020-09-10: 40 mg via INTRAVENOUS
  Filled 2020-09-10: qty 4

## 2020-09-10 MED ORDER — MELATONIN 5 MG PO TABS
5.0000 mg | ORAL_TABLET | Freq: Every day | ORAL | Status: DC
  Administered 2020-09-10: 5 mg via ORAL
  Filled 2020-09-10: qty 1

## 2020-09-10 MED ORDER — ESCITALOPRAM OXALATE 20 MG PO TABS
20.0000 mg | ORAL_TABLET | Freq: Every day | ORAL | Status: DC
  Filled 2020-09-10: qty 1

## 2020-09-10 MED ORDER — LACTATED RINGERS IV SOLN: INTRAVENOUS | Status: DC

## 2020-09-10 MED ORDER — SODIUM CHLORIDE 0.9 % IV SOLN
250.0000 mL | INTRAVENOUS | Status: DC | PRN
  Administered 2020-09-12: 250 mL via INTRAVENOUS

## 2020-09-10 MED ORDER — MIRTAZAPINE 15 MG PO TABS
30.0000 mg | ORAL_TABLET | Freq: Every day | ORAL | Status: DC
  Administered 2020-09-10: 30 mg via ORAL
  Filled 2020-09-10: qty 2

## 2020-09-10 NOTE — ED Notes (Signed)
Patient transported to CT 

## 2020-09-10 NOTE — ED Triage Notes (Signed)
Coming from home, called out for SOB by neighbor, fire got pulse ox in 70s, ems got pulse ox of upper 80s on RA, put on 2L 94%, cough, weak, nausea, right shoulder pain

## 2020-09-10 NOTE — Sepsis Progress Note (Signed)
elink monitoring code sepsis.  

## 2020-09-10 NOTE — ED Notes (Signed)
Leandra Kern  [phone]

## 2020-09-10 NOTE — ED Provider Notes (Signed)
[facility] DEPT Provider Note   CSN: [id number] Arrival date & time: 08/26/2020  1422     History Chief Complaint  Patient presents with  . Shortness of Breath  . Nausea  . Shoulder Pain    [name] is a 87 y.o. female.  HPI Patient has medical history significant for chronic atrial fibrillation with pacemaker, COPD and diabetes.  Patient has had increasingly worsening shortness of breath.  She also reports increasingly severe pain in her right shoulder.  Patient has felt generally very weak.  Symptoms have been going on for at least a few days.  On [facility] arrival, patient's O2 saturation was in the 70s, EMS documented oxygen saturations in the mid 80s on room air, with supplemental oxygen 2 L oxygen saturations up to mid 90s.  Patient does endorse a lot of pain in her left shoulder but denies she is having anterior chest pain.  No documented fever at home that she is aware of.    Past Medical History:  Diagnosis Date  . Ambulates with cane    straight  . Anxiety   . Arthritis    lower back  . CKD (chronic kidney disease), stage III ([place])   . Closed fracture of left olecranon process   . COPD (chronic obstructive pulmonary disease) ([place])   . Depression   . Diabetes mellitus without complication ([place])    type 2  . Headache   . Hypertension   . Orthostatic hypotension   . Osteopenia   . Pacemaker   . 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 Permanent atrial fibrillation ([place])    a.  s/p AVN ablation 2010 with complete heart block s/p PPM (gen change 2015, pt preference for Coumadin).  . Protein calorie malnutrition ([place])   . S/P AV nodal ablation   . TIA (transient ischemic attack)     Patient Active Problem List   Diagnosis Date Noted  . History of open reduction and internal fixation (ORIF) procedure 11/15/2018  . Wound dehiscence, surgical 11/04/2018  . Closed fracture of left  olecranon process 09/20/2018  . Chronic anticoagulation 02/10/2014  . Pure hypercholesterolemia 02/10/2014  . Encounter for therapeutic drug monitoring 06/15/2013  . Atrioventricular block, complete ([place]) 06/07/2013  . Pacemaker-Medtronic 06/07/2013  . Atrial fibrillation ([place]) 02/24/2013    Past Surgical History:  Procedure Laterality Date  . ABLATION  2012   AVN ablation by Dr Lovena Le  . cateracts Bilateral    eyes  . CYST REMOVAL TRUNK     from breast  . I & D EXTREMITY Left 11/04/2018   Procedure: IRRIGATION AND DEBRIDEMENT LEFT ELBOW, WOUND VAC;  Surgeon: Leandrew Koyanagi, MD;  Location: [facility];  Service: Orthopedics;  Laterality: Left;  . I & D EXTREMITY Left 11/15/2018   Procedure: IRRIGATION AND DEBRIDEMENT LEFT ELBOW, WOUND VAC;  Surgeon: Leandrew Koyanagi, MD;  Location: [facility];  Service: Orthopedics;  Laterality: Left;  . ORIF ELBOW FRACTURE Left 10/07/2018   Procedure: OPEN REDUCTION INTERNAL FIXATION (ORIF) LEFT OLECRANON FRACTURE;  Surgeon: Leandrew Koyanagi, MD;  Location: [facility];  Service: Orthopedics;  Laterality: Left;  . PACEMAKER INSERTION    . PERMANENT PACEMAKER GENERATOR CHANGE N/A 07/18/2013   Procedure: PERMANENT PACEMAKER GENERATOR CHANGE;  Surgeon: Deboraha Sprang, MD;  Location: [facility] CATH LAB;  Service: Cardiovascular;  Laterality: N/A;  . WISDOM TOOTH EXTRACTION       OB History  No obstetric history on file.     Family History  Problem Relation Age of Onset  . Stroke Father     Social History   Tobacco Use  . Smoking status: Current Every Day Smoker    Packs/day: 0.50    Years: 67.00    Pack years: 33.50    Types: Cigarettes  . Smokeless tobacco: Never Used  Vaping Use  . Vaping Use: Never used  Substance Use Topics  . Alcohol use: No    Alcohol/week: 0.0 standard drinks  . Drug use: No    Home Medications Prior to Admission medications   Medication Sig Start Date End Date Taking? Authorizing Provider  BD PEN NEEDLE NANO U/F 32G X  4 MM MISC AS DIRECTED USE WITH TOUJEO/ DX E11.49 IN VITRO 01/21/18   [provider]  calcium-vitamin D (OSCAL WITH D) 500-200 MG-UNIT TABS tablet TAKE 1 TABLET BY MOUTH THREE TIMES A DAY 04/06/19   Leandrew Koyanagi, MD  clonazePAM (KLONOPIN) 0.5 MG tablet TAKE 1 TABLET BY MOUTH EVERYDAY AT BEDTIME 02/01/19   [provider]  escitalopram (LEXAPRO) 20 MG tablet Take 20 mg by mouth daily.  02/10/15   [provider]  Insulin Glargine 300 UNIT/ML SOPN Inject 4 Units into the skin daily.     [provider]  levothyroxine (SYNTHROID) 50 MCG tablet Take 50 mcg by mouth every morning. 07/22/19   [provider]  losartan (COZAAR) 50 MG tablet Take 100 mg by mouth daily. 12/01/18   [provider]  Melatonin 5 MG TABS Take 5 mg by mouth at bedtime.     [provider]  mirtazapine (REMERON) 30 MG tablet Take 30 mg by mouth at bedtime. 08/13/20   [provider]  Multiple Vitamin (MULTIVITAMIN) tablet Take 1 tablet by mouth daily.    [provider]  pregabalin (LYRICA) 50 MG capsule Take 1 capsule (50 mg total) by mouth at bedtime. 04/17/20   Hyatt, Max T, DPM  pregabalin (LYRICA) 50 MG capsule Take 1 capsule (50 mg total) by mouth daily. Take one capsule (50 mg total) by mouth daily 04/17/20   Hyatt, Max T, DPM  PREVIDENT 5000 DRY MOUTH 1.1 % GEL dental gel Take 1 application by mouth See admin instructions. Use to brush teeth every day, replacement for regular toothpaste 08/23/18   [provider]  XARELTO 15 MG TABS tablet TAKE 1 TABLET (15 MG TOTAL) BY MOUTH DAILY WITH SUPPER. 03/27/20   [name], MD    Allergies    Eliquis [apixaban], Gabapentin, Rozerem [ramelteon], Tikosyn [dofetilide], Tradjenta [linagliptin], and Temazepam  Review of Systems   Review of Systems 10 systems reviewed and negative except as per HPI Physical Exam Updated Vital Signs LMP  (LMP Unknown)   Physical Exam Constitutional:       Comments: Alert.  Mild increased work of breathing.  Speaking in full short sentences.  HENT:     Head: Normocephalic and atraumatic.     Mouth/Throat:     Pharynx: Oropharynx is clear.  Eyes:     Extraocular Movements: Extraocular movements intact.  Cardiovascular:     Rate and Rhythm: Normal rate and regular rhythm.  Pulmonary:     Comments: Breath sounds significantly diminished on the right.  Airflow adequate on the left. Abdominal:     General: There is no distension.     Palpations: Abdomen is soft.     Tenderness: There is no abdominal tenderness.  Musculoskeletal:  General: No swelling, tenderness or deformity. Normal range of motion.  Skin:    General: Skin is warm and dry.  Neurological:     General: No focal deficit present.     Motor: No weakness.     Coordination: Coordination normal.  Psychiatric:        Mood and Affect: Mood normal.     ED Results / Procedures / Treatments   Labs (all labs ordered are listed, but only abnormal results are displayed) Labs Reviewed - No data to display  EKG None  Radiology No results found.  Procedures Procedures  CRITICAL CARE Performed by: Charlesetta Shanks   Total critical care time: 33 minutes  Critical care time was exclusive of separately billable procedures and treating other patients.  Critical care was necessary to treat or prevent imminent or life-threatening deterioration.  Critical care was time spent personally by me on the following activities: development of treatment plan with patient and/or surrogate as well as nursing, discussions with consultants, evaluation of patient's response to treatment, examination of patient, obtaining history from patient or surrogate, ordering and performing treatments and interventions, ordering and review of laboratory studies, ordering and review of radiographic studies, pulse oximetry and re-evaluation of patient's condition. Medications Ordered in ED Medications -  No data to display  ED Course  I have reviewed the triage vital signs and the nursing notes.  Pertinent labs & imaging results that were available during my care of the patient were reviewed by me and considered in my medical decision making (see chart for details).    MDM Rules/Calculators/A&P                          Patient presents as outlined.  She has had increasing shortness of breath.  CT chest shows a large right pleural effusion with loculated areas.  Patient has very high white count at 39,000.  She does not have hypotension.  Oxygen saturation has responded to supplemental oxygen.  At this time patient will need admission.  Will empirically start on community-acquired pneumonia antibiotics.  Patient will need thoracentesis to further define source of pleural effusion.  Consideration given to possible malignancy.  Patient admitted to medical service for symptomatic treatment of pleural effusion and further diagnostic evaluation. Final Clinical Impression(s) / ED Diagnoses Final diagnoses:  Pleural effusion  Hypoxia  Community acquired pneumonia of right lung, unspecified part of lung    Rx / DC Orders ED Discharge Orders    None       Charlesetta Shanks, MD 09/18/20 1323

## 2020-09-10 NOTE — H&P (Signed)
History and Physical    [name] [id number] DOB: 1928/05/26 DOA: 08/27/2020  I have briefly reviewed the patient's prior medical records in [place]  PCP: Antony Contras, MD  Patient coming from: home  Chief Complaint: Weakness, right shoulder pain  HPI: [name] is a 87 y.o. female with medical history significant of COPD, longstanding tobacco use, DM2, depression, chronic kidney disease stage IIIa, hypertension, A. fib status post ablation/pacemaker dependent, comes to the hospital with complaints of left shoulder pain, weakness and shortness of breath.  Patient has been feeling quite poorly over the last several days, along with nausea but no vomiting.  She denies any fever or chills, she denies any chest congestion.  No chest pain. No abdominal pain, no diarrhea.  ED Course: In the ED she is afebrile, normotensive, requiring 4 L supplemental oxygen.  Blood work shows a potassium of 5.4, sodium 124, BUN 29 creatinine 1.56.  Her white count is 39,000.  Lactic acid is normal.  Chest x-ray showed large right pleural effusion with vascular congestion, possible edema.  She was given ceftriaxone and azithromycin an we are asked to admit  Review of Systems: All systems reviewed, and apart from HPI, all negative  Past Medical History:  Diagnosis Date  . Ambulates with cane    straight  . Anxiety   . Arthritis    lower back  . CKD (chronic kidney disease), stage III ([place])   . Closed fracture of left olecranon process   . COPD (chronic obstructive pulmonary disease) ([place])   . Depression   . Diabetes mellitus without complication ([place])    type 2  . Headache   . Hypertension   . Orthostatic hypotension   . Osteopenia   . Pacemaker   . 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 Permanent atrial fibrillation ([place])    a.  s/p AVN ablation 2010 with complete heart block s/p PPM (gen change 2015, pt preference for Coumadin).  .  Protein calorie malnutrition ([place])   . S/P AV nodal ablation   . TIA (transient ischemic attack)     Past Surgical History:  Procedure Laterality Date  . ABLATION  2012   AVN ablation by Dr Lovena Le  . cateracts Bilateral    eyes  . CYST REMOVAL TRUNK     from breast  . I & D EXTREMITY Left 11/04/2018   Procedure: IRRIGATION AND DEBRIDEMENT LEFT ELBOW, WOUND VAC;  Surgeon: Leandrew Koyanagi, MD;  Location: [facility];  Service: Orthopedics;  Laterality: Left;  . I & D EXTREMITY Left 11/15/2018   Procedure: IRRIGATION AND DEBRIDEMENT LEFT ELBOW, WOUND VAC;  Surgeon: Leandrew Koyanagi, MD;  Location: [facility];  Service: Orthopedics;  Laterality: Left;  . ORIF ELBOW FRACTURE Left 10/07/2018   Procedure: OPEN REDUCTION INTERNAL FIXATION (ORIF) LEFT OLECRANON FRACTURE;  Surgeon: Leandrew Koyanagi, MD;  Location: [facility];  Service: Orthopedics;  Laterality: Left;  . PACEMAKER INSERTION    . PERMANENT PACEMAKER GENERATOR CHANGE N/A 07/18/2013   Procedure: PERMANENT PACEMAKER GENERATOR CHANGE;  Surgeon: Deboraha Sprang, MD;  Location: [facility] CATH [facility];  Service: Cardiovascular;  Laterality: N/A;  . WISDOM TOOTH EXTRACTION       reports that she has been smoking cigarettes. She has a 33.50 pack-year smoking history. She has never used smokeless tobacco. She reports that she does not drink alcohol and does not use drugs.  Allergies  Allergen Reactions  .  Eliquis [Apixaban] Other (See Comments)    Made pt have weak muscles  . Gabapentin Nausea Only and Other (See Comments)    SHAKES AND NAUSEA   . Rozerem [Ramelteon] Other (See Comments)    DIZZY AND HEAD THROBBING  . Tikosyn [Dofetilide] Itching and Swelling    Mouth swells, itching  . Tradjenta [Linagliptin] Diarrhea and Other (See Comments)    Joint Pain, Stomach issues  . Temazepam Other (See Comments)    INSOMNIA    Family History  Problem Relation Age of Onset  . Stroke Father     Prior to Admission medications   Medication Sig Start  Date End Date Taking? Authorizing Provider  BD PEN NEEDLE NANO U/F 32G X 4 MM MISC AS DIRECTED USE WITH TOUJEO/ DX E11.49 IN VITRO 01/21/18   [provider]  calcium-vitamin D (OSCAL WITH D) 500-200 MG-UNIT TABS tablet TAKE 1 TABLET BY MOUTH THREE TIMES A DAY 04/06/19   Leandrew Koyanagi, MD  clonazePAM (KLONOPIN) 0.5 MG tablet TAKE 1 TABLET BY MOUTH EVERYDAY AT BEDTIME 02/01/19   [provider]  escitalopram (LEXAPRO) 20 MG tablet Take 20 mg by mouth daily.  02/10/15   [provider]  Insulin Glargine 300 UNIT/ML SOPN Inject 4 Units into the skin daily.     [provider]  levothyroxine (SYNTHROID) 50 MCG tablet Take 50 mcg by mouth every morning. 07/22/19   [provider]  losartan (COZAAR) 50 MG tablet Take 100 mg by mouth daily. 12/01/18   [provider]  Melatonin 5 MG TABS Take 5 mg by mouth at bedtime.     [provider]  mirtazapine (REMERON) 30 MG tablet Take 30 mg by mouth at bedtime. 08/13/20   [provider]  Multiple Vitamin (MULTIVITAMIN) tablet Take 1 tablet by mouth daily.    [provider]  pregabalin (LYRICA) 50 MG capsule Take 1 capsule (50 mg total) by mouth at bedtime. 04/17/20   Hyatt, Max T, DPM  pregabalin (LYRICA) 50 MG capsule Take 1 capsule (50 mg total) by mouth daily. Take one capsule (50 mg total) by mouth daily 04/17/20   Hyatt, Max T, DPM  PREVIDENT 5000 DRY MOUTH 1.1 % GEL dental gel Take 1 application by mouth See admin instructions. Use to brush teeth every day, replacement for regular toothpaste 08/23/18   [provider]  XARELTO 15 MG TABS tablet TAKE 1 TABLET (15 MG TOTAL) BY MOUTH DAILY WITH SUPPER. 03/27/20   [name], MD    Physical Exam: Vitals:   08/21/2020 1515 09/09/2020 1530 08/27/2020 1547 08/26/2020 1633  BP:   (!) 146/73 (!) 172/88  Pulse: 80 80 80 81  Resp: (!) 21 (!) 22 (!) 22 (!) 21  Temp:   (!) 97.5 F (36.4 C)   TempSrc:   Oral   SpO2: 97% 95% 99% 93%       Constitutional: NAD, calm, comfortable Eyes: PERRL, lids and conjunctivae normal ENMT: Mucous membranes are moist.  Neck: normal, supple Respiratory: Diminished breath sounds on the right, no wheezing, tachypneic Cardiovascular: Regular rate and rhythm, no murmurs / rubs / gallops. No extremity edema  Abdomen: no tenderness, no masses palpated. Bowel sounds positive.  Musculoskeletal: no clubbing / cyanosis. Normal muscle tone.  Skin: no rashes, lesions, ulcers. No induration Neurologic: CN 2-12 grossly intact. Strength 5/5 in all 4.  Psychiatric: Normal judgment and insight. Alert and oriented x 3. Normal mood.   Labs on Admission: I have  personally reviewed following labs and imaging studies  CBC: Recent Labs  Lab 08/27/2020 1458  WBC 39.0*  NEUTROABS 35.4*  HGB 13.8  HCT 42.2  MCV 95.5  PLT 123456   Basic Metabolic Panel: Recent Labs  Lab 08/26/2020 1458  NA 124*  K 5.4*  CL 87*  CO2 26  GLUCOSE 183*  BUN 29*  CREATININE 1.56*  CALCIUM 9.4   Liver Function Tests: Recent Labs  Lab 09/03/2020 1458  AST 27  ALT 22  ALKPHOS 104  BILITOT 1.0  PROT 6.8  ALBUMIN 3.1*   Coagulation Profile: Recent Labs  Lab 09/13/2020 1458  INR 2.3*   BNP (last 3 results) No results for input(s): PROBNP in the last 8760 hours. CBG: No results for input(s): GLUCAP in the last 168 hours. Thyroid Function Tests: No results for input(s): TSH, T4TOTAL, FREET4, T3FREE, THYROIDAB in the last 72 hours. Urine analysis:    Component Value Date/Time   COLORURINE YELLOW 01/19/2019 [place] 01/19/2019 1437   LABSPEC 1.008 01/19/2019 1437   PHURINE 7.0 01/19/2019 1437   GLUCOSEU NEGATIVE 01/19/2019 1437   HGBUR NEGATIVE 01/19/2019 1437   BILIRUBINUR NEGATIVE 01/19/2019 1437   KETONESUR 20 (A) 01/19/2019 1437   PROTEINUR NEGATIVE 01/19/2019 1437   UROBILINOGEN 1.0 12/31/2006 1154   NITRITE NEGATIVE 01/19/2019 1437   LEUKOCYTESUR NEGATIVE 01/19/2019 1437      Radiological Exams on Admission: DG Chest Port 1 View  Result Date: 09/11/2020 CLINICAL DATA:  Dyspnea.  Shortness of breath.  Cough. EXAM: PORTABLE CHEST 1 VIEW COMPARISON:  08/20/2015 FINDINGS: Large right pleural effusion occupies the lower 1/2 of right hemithorax, and tracks laterally at the apex. Trace left pleural effusion suspected. Left-sided pacemaker in place. Heart size grossly normal, right heart border obscured. Aortic atherosclerosis. There is vascular congestion with mild interstitial thickening, possible edema. No pneumothorax. Bones are under mineralized without acute osseous abnormality. IMPRESSION: 1. Large right pleural effusion spanning the lower 1/2 of the right hemithorax. 2. Trace left pleural effusion suspected. 3. Vascular congestion with possible interstitial edema. Electronically Signed   By: Keith Rake M.D.   On: 09/11/2020 15:50    EKG: Independently reviewed. Paced rhythm   Assessment/Plan  Principal Problem Acute hypoxic respiratory failure due to large right-sided pleural effusion -this is new, given longstanding smoking history there is some concern about malignancy. -We will give Lasix x1, ultrasound thoracentesis in the morning -Obtain 2D echo -CT scan of the chest pending  Active Problems CKD stage IIIa-Baseline creatinine 1.2-1.6, currently at baseline  Hypothyroidism-continue home Synthroid, check TSH  Hyponatremia -likely hypervolemic, give Lasix and monitor  Hyperkalemia-potassium borderline elevated at 5.4, will receive Lasix  Permanent A. fib-status post ablation, currently has a pacemaker.  Continue Xarelto  Type 2 diabetes mellitus-placed on sliding scale  Depression/anxiety-continue home medications  Elevated WBC-White count 39K on admission, prior values normal just last  month.  She was given ceftriaxone and azithromycin, continue  DVT prophylaxis: Xarelto  Code Status: DNR  Family Communication: no family in the  room Disposition Plan: home when ready  Bed Type: Telemetry  Consults called: none  Obs/Inp: Obs   Marzetta Board, MD, PhD [facility]  Contact via [email or web address]  09/03/2020, 4:38 PM

## 2020-09-10 NOTE — Progress Notes (Signed)
Patient transported to room 1424 at 2030 by ED on 2L [place]. VS taken on admission with significant SpO2 of 78%. Patient titrated up on oxygen and now on 7L HFNC. spO2 increased to 94% Patient placed on telemetry verified by 2 RN. Known V paced rhythm. Patient with rhonchi rales.

## 2020-09-10 NOTE — Progress Notes (Incomplete)
Patient transported to room 1424 at 2030 by ED on 2L [place]. VS taken on admission with significant SpO2 of 78%. Patient titrated up on oxygen and now on 7L HFNC. spO2 increased to 94% Patient placed on telemetry verified by 2 RN. Known V paced rhythm. Patient with rhonchi rales heard. States

## 2020-09-11 ENCOUNTER — Observation Stay (HOSPITAL_COMMUNITY): Payer: Medicare Other

## 2020-09-11 ENCOUNTER — Inpatient Hospital Stay (HOSPITAL_COMMUNITY): Payer: Medicare Other

## 2020-09-11 DIAGNOSIS — R0600 Dyspnea, unspecified: Secondary | ICD-10-CM | POA: Diagnosis present

## 2020-09-11 DIAGNOSIS — N1831 Chronic kidney disease, stage 3a: Secondary | ICD-10-CM | POA: Diagnosis present

## 2020-09-11 DIAGNOSIS — J9601 Acute respiratory failure with hypoxia: Secondary | ICD-10-CM

## 2020-09-11 DIAGNOSIS — J90 Pleural effusion, not elsewhere classified: Secondary | ICD-10-CM

## 2020-09-11 DIAGNOSIS — Z950 Presence of cardiac pacemaker: Secondary | ICD-10-CM | POA: Diagnosis not present

## 2020-09-11 DIAGNOSIS — R1310 Dysphagia, unspecified: Secondary | ICD-10-CM | POA: Diagnosis not present

## 2020-09-11 DIAGNOSIS — Z515 Encounter for palliative care: Secondary | ICD-10-CM | POA: Diagnosis not present

## 2020-09-11 DIAGNOSIS — Z20822 Contact with and (suspected) exposure to covid-19: Secondary | ICD-10-CM | POA: Diagnosis present

## 2020-09-11 DIAGNOSIS — E039 Hypothyroidism, unspecified: Secondary | ICD-10-CM | POA: Diagnosis present

## 2020-09-11 DIAGNOSIS — E875 Hyperkalemia: Secondary | ICD-10-CM | POA: Diagnosis present

## 2020-09-11 DIAGNOSIS — I4821 Permanent atrial fibrillation: Secondary | ICD-10-CM | POA: Diagnosis present

## 2020-09-11 DIAGNOSIS — Z66 Do not resuscitate: Secondary | ICD-10-CM | POA: Diagnosis present

## 2020-09-11 DIAGNOSIS — I5032 Chronic diastolic (congestive) heart failure: Secondary | ICD-10-CM | POA: Diagnosis present

## 2020-09-11 DIAGNOSIS — A419 Sepsis, unspecified organism: Secondary | ICD-10-CM | POA: Diagnosis present

## 2020-09-11 DIAGNOSIS — Z7901 Long term (current) use of anticoagulants: Secondary | ICD-10-CM | POA: Diagnosis not present

## 2020-09-11 DIAGNOSIS — J9811 Atelectasis: Secondary | ICD-10-CM | POA: Diagnosis present

## 2020-09-11 DIAGNOSIS — J189 Pneumonia, unspecified organism: Secondary | ICD-10-CM | POA: Diagnosis not present

## 2020-09-11 DIAGNOSIS — J918 Pleural effusion in other conditions classified elsewhere: Secondary | ICD-10-CM | POA: Diagnosis present

## 2020-09-11 DIAGNOSIS — I5031 Acute diastolic (congestive) heart failure: Secondary | ICD-10-CM | POA: Diagnosis not present

## 2020-09-11 DIAGNOSIS — J869 Pyothorax without fistula: Secondary | ICD-10-CM

## 2020-09-11 DIAGNOSIS — E871 Hypo-osmolality and hyponatremia: Secondary | ICD-10-CM | POA: Diagnosis present

## 2020-09-11 DIAGNOSIS — F419 Anxiety disorder, unspecified: Secondary | ICD-10-CM | POA: Diagnosis present

## 2020-09-11 DIAGNOSIS — I130 Hypertensive heart and chronic kidney disease with heart failure and stage 1 through stage 4 chronic kidney disease, or unspecified chronic kidney disease: Secondary | ICD-10-CM | POA: Diagnosis present

## 2020-09-11 DIAGNOSIS — G9341 Metabolic encephalopathy: Secondary | ICD-10-CM | POA: Diagnosis present

## 2020-09-11 DIAGNOSIS — J690 Pneumonitis due to inhalation of food and vomit: Secondary | ICD-10-CM | POA: Diagnosis present

## 2020-09-11 DIAGNOSIS — F32A Depression, unspecified: Secondary | ICD-10-CM | POA: Diagnosis present

## 2020-09-11 DIAGNOSIS — N179 Acute kidney failure, unspecified: Secondary | ICD-10-CM | POA: Diagnosis not present

## 2020-09-11 DIAGNOSIS — E1122 Type 2 diabetes mellitus with diabetic chronic kidney disease: Secondary | ICD-10-CM | POA: Diagnosis present

## 2020-09-11 DIAGNOSIS — R6520 Severe sepsis without septic shock: Secondary | ICD-10-CM | POA: Diagnosis present

## 2020-09-11 LAB — CBC
HCT: 39.1 % (ref 36.0–46.0)
Hemoglobin: 12.7 g/dL (ref 12.0–15.0)
MCH: 30.9 pg (ref 26.0–34.0)
MCHC: 32.5 g/dL (ref 30.0–36.0)
MCV: 95.1 fL (ref 80.0–100.0)
Platelets: 194 10*3/uL (ref 150–400)
RBC: 4.11 MIL/uL (ref 3.87–5.11)
RDW: 13.8 % (ref 11.5–15.5)
WBC: 42.7 10*3/uL — ABNORMAL HIGH (ref 4.0–10.5)
nRBC: 0 % (ref 0.0–0.2)

## 2020-09-11 LAB — BODY FLUID CELL COUNT WITH DIFFERENTIAL
Eos, Fluid: 0 %
Lymphs, Fluid: 8 %
Monocyte-Macrophage-Serous Fluid: 4 % — ABNORMAL LOW (ref 50–90)
Neutrophil Count, Fluid: 88 % — ABNORMAL HIGH (ref 0–25)
Total Nucleated Cell Count, Fluid: 12570 cu mm — ABNORMAL HIGH (ref 0–1000)

## 2020-09-11 LAB — COMPREHENSIVE METABOLIC PANEL
ALT: 18 U/L (ref 0–44)
AST: 15 U/L (ref 15–41)
Albumin: 2.6 g/dL — ABNORMAL LOW (ref 3.5–5.0)
Alkaline Phosphatase: 102 U/L (ref 38–126)
Anion gap: 12 (ref 5–15)
BUN: 24 mg/dL — ABNORMAL HIGH (ref 8–23)
CO2: 24 mmol/L (ref 22–32)
Calcium: 8.9 mg/dL (ref 8.9–10.3)
Chloride: 89 mmol/L — ABNORMAL LOW (ref 98–111)
Creatinine, Ser: 1.35 mg/dL — ABNORMAL HIGH (ref 0.44–1.00)
GFR, Estimated: 38 mL/min — ABNORMAL LOW (ref >60)
Glucose, Bld: 173 mg/dL — ABNORMAL HIGH (ref 70–99)
Potassium: 4.9 mmol/L (ref 3.5–5.1)
Sodium: 125 mmol/L — ABNORMAL LOW (ref 135–145)
Total Bilirubin: 0.6 mg/dL (ref 0.3–1.2)
Total Protein: 5.8 g/dL — ABNORMAL LOW (ref 6.5–8.1)

## 2020-09-11 LAB — GLUCOSE, CAPILLARY
Glucose-Capillary: 182 mg/dL — ABNORMAL HIGH (ref 70–99)
Glucose-Capillary: 216 mg/dL — ABNORMAL HIGH (ref 70–99)
Glucose-Capillary: 194 mg/dL — ABNORMAL HIGH (ref 70–99)
Glucose-Capillary: 165 mg/dL — ABNORMAL HIGH (ref 70–99)

## 2020-09-11 LAB — ALBUMIN, PLEURAL OR PERITONEAL FLUID: Albumin, Fluid: 1.9 g/dL

## 2020-09-11 LAB — GLUCOSE, PLEURAL OR PERITONEAL FLUID: Glucose, Fluid: 42 mg/dL

## 2020-09-11 LAB — ECHOCARDIOGRAM COMPLETE
S' Lateral: 2.3 cm
Weight: 1738.99 oz

## 2020-09-11 LAB — LACTATE DEHYDROGENASE, PLEURAL OR PERITONEAL FLUID: LD, Fluid: 1617 U/L — ABNORMAL HIGH (ref 3–23)

## 2020-09-11 LAB — PROTEIN, PLEURAL OR PERITONEAL FLUID: Total protein, fluid: 3.8 g/dL

## 2020-09-11 MED ORDER — METRONIDAZOLE IN NACL 5-0.79 MG/ML-% IV SOLN
500.0000 mg | Freq: Three times a day (TID) | INTRAVENOUS | Status: DC
  Administered 2020-09-11 – 2020-09-13 (×5): 500 mg via INTRAVENOUS
  Filled 2020-09-11 (×5): qty 100

## 2020-09-11 MED ORDER — GUAIFENESIN ER 600 MG PO TB12: 1200.0000 mg | ORAL_TABLET | Freq: Two times a day (BID) | ORAL | Status: DC

## 2020-09-11 MED ORDER — LIDOCAINE HCL 1 % IJ SOLN
INTRAMUSCULAR | Status: AC
  Filled 2020-09-11: qty 20

## 2020-09-11 MED ORDER — IPRATROPIUM BROMIDE 0.02 % IN SOLN
0.5000 mg | Freq: Three times a day (TID) | RESPIRATORY_TRACT | Status: DC
  Administered 2020-09-11 – 2020-09-12 (×2): 0.5 mg via RESPIRATORY_TRACT
  Filled 2020-09-11 (×2): qty 2.5

## 2020-09-11 MED ORDER — SODIUM CHLORIDE (PF) 0.9 % IJ SOLN
10.0000 mg | Freq: Every day | INTRAMUSCULAR | Status: DC
  Administered 2020-09-11 – 2020-09-12 (×2): 10 mg via INTRAPLEURAL
  Filled 2020-09-11 (×3): qty 10

## 2020-09-11 MED ORDER — VANCOMYCIN HCL 1000 MG/200ML IV SOLN
1000.0000 mg | Freq: Once | INTRAVENOUS | Status: AC
  Administered 2020-09-11: 1000 mg via INTRAVENOUS
  Filled 2020-09-11: qty 200

## 2020-09-11 MED ORDER — STERILE WATER FOR INJECTION IJ SOLN
5.0000 mg | Freq: Every day | RESPIRATORY_TRACT | Status: DC
  Administered 2020-09-11 – 2020-09-12 (×2): 5 mg via INTRAPLEURAL
  Filled 2020-09-11 (×3): qty 5

## 2020-09-11 MED ORDER — IPRATROPIUM BROMIDE 0.02 % IN SOLN
0.5000 mg | Freq: Four times a day (QID) | RESPIRATORY_TRACT | Status: DC
  Administered 2020-09-11: 0.5 mg via RESPIRATORY_TRACT
  Filled 2020-09-11: qty 2.5

## 2020-09-11 MED ORDER — VANCOMYCIN HCL 750 MG/150ML IV SOLN: 750.0000 mg | INTRAVENOUS | Status: DC

## 2020-09-11 MED ORDER — SODIUM CHLORIDE 0.9% FLUSH
10.0000 mL | Freq: Three times a day (TID) | INTRAVENOUS | Status: DC
  Administered 2020-09-11 – 2020-09-15 (×9): 10 mL

## 2020-09-11 MED ORDER — SODIUM CHLORIDE 0.9 % IV SOLN
1.0000 g | Freq: Two times a day (BID) | INTRAVENOUS | Status: DC
  Administered 2020-09-11: 1 g via INTRAVENOUS
  Filled 2020-09-11 (×2): qty 1

## 2020-09-11 MED ORDER — CHLORHEXIDINE GLUCONATE CLOTH 2 % EX PADS
6.0000 | MEDICATED_PAD | Freq: Every day | CUTANEOUS | Status: DC
  Administered 2020-09-11 – 2020-09-15 (×5): 6 via TOPICAL

## 2020-09-11 MED ORDER — LEVALBUTEROL HCL 0.63 MG/3ML IN NEBU
0.6300 mg | INHALATION_SOLUTION | Freq: Four times a day (QID) | RESPIRATORY_TRACT | Status: DC
  Administered 2020-09-11: 0.63 mg via RESPIRATORY_TRACT
  Filled 2020-09-11: qty 3

## 2020-09-11 MED ORDER — FUROSEMIDE 10 MG/ML IJ SOLN
40.0000 mg | Freq: Once | INTRAMUSCULAR | Status: AC
  Administered 2020-09-11: 40 mg via INTRAVENOUS
  Filled 2020-09-11: qty 4

## 2020-09-11 MED ORDER — LEVALBUTEROL HCL 0.63 MG/3ML IN NEBU
0.6300 mg | INHALATION_SOLUTION | Freq: Three times a day (TID) | RESPIRATORY_TRACT | Status: DC
  Administered 2020-09-11 – 2020-09-12 (×2): 0.63 mg via RESPIRATORY_TRACT
  Filled 2020-09-11 (×2): qty 3

## 2020-09-11 NOTE — Progress Notes (Signed)
Chaplain engaged in an initial visit with [name] and her sister.  [name] was sleeping during this time.  Chaplain explained to sister that the notaries at the hospital usually work with healthcare POA only.  Sister understood and said they would work on getting their own notary to come in.    Sister also noted that [name] lost her husband a couple of days ago unexpectedly.  They have been working to get affairs in order and be there for [name].  Chaplain offered support.  Chaplain is available to provide assistance as needed.    09/11/20 1600  Clinical Encounter Type  Visited With Patient and family together  Visit Type Initial

## 2020-09-11 NOTE — Progress Notes (Signed)
  Echocardiogram 2D Echocardiogram has been performed.  [name] 09/11/2020, 10:55 AM

## 2020-09-11 NOTE — Progress Notes (Signed)
Procedure completed, Vital signs stable, family back at bedside.

## 2020-09-11 NOTE — Progress Notes (Signed)
Right Chest tube placement procedure, starting. Vital Signs stable

## 2020-09-11 NOTE — Progress Notes (Signed)
PROGRESS NOTE    [name]  [id number] DOB: 06/03/1928 DOA: 09/05/2020 PCP: Antony Contras, MD   Brief Narrative:  The patient is an 87 year old elderly Caucasian female with a past medical history significant for but not limited to COPD, longstanding tobacco abuse, diabetes mellitus type 2, depression, chronic kidney disease stage IIIa, hypertension, history of atrial fibrillation status post ablation pacemaker placement, as well as other comorbidities who presented to the hospital with shoulder pain weakness and shortness of breath.  She has been feeling quite poorly over the last few days along with nausea and sister states that she has been vomiting.  She presented because of these times and in the ED she was found to be afebrile but required 4 L supplemental oxygen via nasal cannula and had a WBC of 39,000 on admission.  Chest x-ray showed a large right pleural effusion with vascular congestion and possible edema and she is initiated on IV ceftriaxone azithromycin and further work-up including obtain a CT scan.  CT scan showed moderate right pleural effusion which is partially loculated as well as tracking in the fissures as well as significant debris and retained secretions in the tracheobronchial tree with filling of the right greater than left bronchi.  She had a complete middle lobe and near complete opacification of the right lower lobe with a combination of postobstructive and compressive atelectasis.  This raises a question of aspiration.  SLP evaluated and recommending n.p.o. for now.  Attempted thoracentesis was done however because the fluid was multiloculated in nature only 60 cc was able to be aspirated and sent for studies.  Pulmonary was consulted for further evaluation and patient was given another dose of IV Lasix and started on neb treatments.  Her respiratory status is extremely tenuous and WBC is worsening and it went from 39,000 and increased to 42,700.  We will need to monitor  her clinical status and respiratory status carefully.  Assessment & Plan:   Active Problems:   Acute hypoxemic respiratory failure (HCC)  Acute Hypoxic Respiratory Failure due to large right-sided pleural effusion  -This is new, given longstanding smoking history there is some concern about malignancy. -SpO2: 90 % O2 Flow Rate (L/min): 6 L/min -Initially given IV Lasix x1 and will repeat -Ultrasound thoracentesis done but limited as  -Added Xopenex/Atrovent q6h as well as Guaifenesin 1200 mg po BID and has Flutter Valve and Incentive Spirometry  -Obtain 2D echo and showed EF of 123456; LV Diastolic Fxn could not be evaluated due to A Fib; Right Ventricular Systolic Fxn was Normal but she did have a mildly elevated pulmonary artery systolic pressure  -BNP was 326.6  -CT scan of the chest done and showed "Moderate right pleural effusion which is partially loculated as well as tracking into the fissures. Significant debris retained secretions in the tracheobronchial tree, with filling of the right greater than left bronchi. Complete right middle lobe and near complete opacification of the right lower lobe likely combination of postobstructive and compressive atelectasis. There is complete filling of the right lower lobe bronchus, segmental bronchi, and some segmental branches. Possibility of aspiration is raised given patulous esophagus.  Consider follow-up imaging after improvement of pleural effusion and re-expansion of the right lung. No obvious centrally obstructing or pulmonary mass. Small left pleural effusion. Patchy airspace disease in the dependent left upper lobe, lingula, and left lower lobe may be pneumonia or aspiration. Few prominent mediastinal lymph nodes are likely reactive. Detailed  mediastinal assessment is limited on this  noncontrast exam.  Borderline cardiomegaly with coronary artery calcifications. Aortic atherosclerosis." -SLP Evaluating and recommending NPO currently   -Interventional radiology tried an ultrasound-guided diagnostic and therapeutic right thoracentesis but only yielded 60 cc of turbid yellow fluid given the extensive multiloculated nature of the pleural fluid only 60 cc to be aspirated and was sent for studies -Pulmonary consulted for further evaluation and recommendations -Repeat chest x-ray in a.m. -Patient's WBC is worsening went from 39.0 and now 42.7  CKD stage IIIa -Baseline creatinine 1.2-1.6, currently at baseline as BUN/Cr went from 29/1.56 -> 24/1.35 -Getting IV Lasix today -Avoid further nephrotoxic medications, contrast dyes, hypotension and renally dose medications  -Continue to monitor and trend renal function and repeat CMP in a.m.  Hypothyroidism -Continue Levothyroxine 50 mcg po Daily  -Check TSH in the AM   Hyponatremia -? Hypervolemic; Patient's sister states she has not been eating very well  -Given IV Lasix yesterday and will repeat a dose of IV Lasix 40 mg x1 -Was getting IVF but now stopped  -Continue to Monitor and Trend -Repeat CMP in the AM   Hyperkalemia -Potassium was borderline elevated at 5.4 on admission and received IV Lasix -K+ has now gone from 5.4 -> 4.9 -Will get another does of IV Lasix today -Continue to Monitor and Trend -Repeat CMP in the AM   Permanent A. Fib -Status post ablation, currently has a pacemaker.   -Continue to Monitor On Telemetry -C/w Rivaroxaban 15 mg po Daily  Continue Xarelto  Type 2 Diabetes Mellitus -Placed on Sensitive Novolog SSI AC -CBG's ranging from 150-216 -Continue to Monitor CBG's carefully   Depression/Anxiety -Husband recently passed away -C/w Mirtazapine 30 mg po qHS and Escitalopram 20 mg po Daily  -C/w Clonazepam 0.5 mg po qhSprn Sleep  Elevated WBC -White count 39K on admission, prior values normal just last month and now is 42.7K.   -She was given ceftriaxone and azithromycin and will continue for now -May need to escalate antibiotics  and give another dose of IV Lasix and will have pulmonary weigh in -We will need to follow pleural fluid studies and blood cultures x2 -Urinalysis was unremarkable and urine culture still pending -Repeat chest x-ray in the a.m.   DVT prophylaxis: Anticoagulated with Xarelto  Code Status: DO NOT RESUSCITATE  Family Communication: Discussed with Sister at bedside  Disposition Plan: Pending further clinical improvement and evaluation by PT/OT as well SLP and clearance by Pulmonary   Status is: Observation  The patient will require care spanning > 2 midnights and should be moved to inpatient because: Ongoing diagnostic testing needed not appropriate for outpatient work up, Unsafe d/c plan, IV treatments appropriate due to intensity of illness or inability to take PO and Inpatient level of care appropriate due to severity of illness  Dispo: The patient is from: Home              Anticipated d/c is to: TBD              Patient currently is not medically stable to d/c.   Difficult to place patient No  Consultants:   Pulmonary   IR   Procedures:  Ultrasound-guided diagnostic and therapeutic right thoracentesis performed yielding 60 cc of turbid, yellow fluid  ECHOCARDIOGRAM  IMPRESSIONS    1. Left ventricular ejection fraction, by estimation, is 60 to 65%. The  left ventricle has normal function. The left ventricle has no regional  wall motion abnormalities. Left ventricular diastolic function could not  be evaluated.  2. Right ventricular systolic function is normal. The right ventricular  size is normal. There is mildly elevated pulmonary artery systolic  pressure.  3. Left atrial size was mildly dilated.  4. Right atrial size was mildly dilated.  5. The mitral valve is normal in structure. Trivial mitral valve  regurgitation. No evidence of mitral stenosis.  6. Tricuspid valve regurgitation is mild to moderate.  7. The aortic valve is normal in structure. Aortic valve  regurgitation is  not visualized. No aortic stenosis is present.  8. The inferior vena cava is normal in size with greater than 50%  respiratory variability, suggesting right atrial pressure of 3 mmHg.   FINDINGS  Left Ventricle: Left ventricular ejection fraction, by estimation, is 60  to 65%. The left ventricle has normal function. The left ventricle has no  regional wall motion abnormalities. The left ventricular internal cavity  size was normal in size. There is  no left ventricular hypertrophy. Left ventricular diastolic function  could not be evaluated due to atrial fibrillation. Left ventricular  diastolic function could not be evaluated.   Right Ventricle: The right ventricular size is normal. No increase in  right ventricular wall thickness. Right ventricular systolic function is  normal. There is mildly elevated pulmonary artery systolic pressure. The  tricuspid regurgitant velocity is 2.91  m/s, and with an assumed right atrial pressure of 3 mmHg, the estimated  right ventricular systolic pressure is 123456 mmHg.   Left Atrium: Left atrial size was mildly dilated.   Right Atrium: Right atrial size was mildly dilated.   Pericardium: There is no evidence of pericardial effusion.   Mitral Valve: The mitral valve is normal in structure. Mild mitral annular  calcification. Trivial mitral valve regurgitation. No evidence of mitral  valve stenosis.   Tricuspid Valve: The tricuspid valve is normal in structure. Tricuspid  valve regurgitation is mild to moderate. No evidence of tricuspid  stenosis.   Aortic Valve: The aortic valve is normal in structure. Aortic valve  regurgitation is not visualized. No aortic stenosis is present.   Pulmonic Valve: The pulmonic valve was normal in structure. Pulmonic valve  regurgitation is mild. No evidence of pulmonic stenosis.   Aorta: The aortic root is normal in size and structure.   Venous: The inferior vena cava is normal in size  with greater than 50%  respiratory variability, suggesting right atrial pressure of 3 mmHg.   IAS/Shunts: No atrial level shunt detected by color flow Doppler.   Additional Comments: A device lead is visualized in the right atrium and  right ventricle.     LEFT VENTRICLE  PLAX 2D  LVIDd:     3.80 cm  LVIDs:     2.30 cm  LV PW:     0.80 cm  LV IVS:    1.00 cm  LVOT diam:   1.50 cm  LV SV:     50  LV SV Index:  34  LVOT Area:   1.77 cm     RIGHT VENTRICLE  TAPSE (M-mode): 1.6 cm   LEFT ATRIUM       Index    RIGHT ATRIUM      Index  LA diam:    3.70 cm 2.48 cm/m RA Area:   17.60 cm  LA Vol (A2C):  48.7 ml 32.64 ml/m RA Volume:  53.00 ml 35.52 ml/m  LA Vol (A4C):  48.9 ml 32.77 ml/m  LA Biplane Vol: 49.8 ml 33.37 ml/m  AORTIC VALVE  LVOT Vmax:  168.33 cm/s  LVOT Vmean: 110.667 cm/s  LVOT VTI:  0.283 m    AORTA  Ao Root diam: 2.80 cm  Ao Asc diam: 2.90 cm   TRICUSPID VALVE  TR Peak grad:  33.9 mmHg  TR Vmax:    291.00 cm/s    SHUNTS  Systemic VTI: 0.28 m  Systemic Diam: 1.50 cm   Antimicrobials:  Anti-infectives (From admission, onward)   Start     Dose/Rate Route Frequency Ordered Stop   08/30/2020 1615  cefTRIAXone (ROCEPHIN) 2 g in sodium chloride 0.9 % 100 mL IVPB        2 g 200 mL/hr over 30 Minutes Intravenous Every 24 hours 08/24/2020 1601     09/07/2020 1615  azithromycin (ZITHROMAX) 500 mg in sodium chloride 0.9 % 250 mL IVPB        500 mg 250 mL/hr over 60 Minutes Intravenous Every 24 hours 08/20/2020 1601          Subjective: Seen and examined at bedside and she is very deconditioned and fatigued appearing and denied any pain but was very weak.  Had a congestion noted to her and had a slight respiratory increase and mild distress.  Wearing supplemental oxygen and states that she is not feeling so good.  No other concerns or points at this time.  Objective: Vitals:   09/11/20 1314  09/11/20 1334 09/11/20 1404 09/11/20 1407  BP: (!) 135/54     Pulse: 79  80 80  Resp: (!) 22     Temp: (!) 97.5 F (36.4 C)     TempSrc: Oral     SpO2: 93% 96% 94% 90%  Weight:        Intake/Output Summary (Last 24 hours) at 09/11/2020 1429 Last data filed at 09/11/2020 [id number] Gross per 24 hour  Intake 2419.81 ml  Output 1275 ml  Net 1144.81 ml   Filed Weights   08/25/2020 2036  Weight: 49.3 kg   Examination: Physical Exam:  Constitutional: Thin frail chronically ill-appearing Caucasian female currently in mild respiratory distress on supplemental oxygen via nasal cannula and is slightly tachypneic with diminished breath sounds and rhonchi as well as a wet sounding cough Eyes: Lids and conjunctivae normal, sclerae anicteric  ENMT: External Ears, Nose appear normal. Grossly normal hearing.  Neck: Appears normal, supple, no cervical masses, normal ROM, no appreciable thyromegaly; Respiratory: Diminished to auscultation bilaterally with coarse breath sounds and increased rhonchi and some mild wheezing/crackles.,  Has increased respiratory effort and is wearing supplemental oxygen via nasal cannula  Cardiovascular: Irregularly irregular and tachycardic, no murmurs / rubs / gallops. S1 and S2 auscultated. No extremity edema. 2+ pedal pulses. No carotid bruits.  Abdomen: Soft, non-tender, non-distended. Bowel sounds positive.  GU: Deferred. Musculoskeletal: No clubbing / cyanosis of digits/nails. No joint deformity upper and lower extremities.  Skin: No rashes, lesions, ulcers on a limited skin evaluation. No induration; Warm and dry.  Neurologic: CN 2-12 grossly intact with no focal deficits. Romberg sign and cerebellar reflexes not assessed.  Psychiatric: Mildly impaired judgment and insight.  Somnolent and drowsy. Fatigued appearing and mildly depressed mood and appropriate affect.   Data Reviewed: I have personally reviewed following labs and imaging studies  CBC: Recent Labs  Lab  09/04/2020 1458 09/11/20 0345  WBC 39.0* 42.7*  NEUTROABS 35.4*  --   HGB 13.8 12.7  HCT 42.2 39.1  MCV 95.5 95.1  PLT 203 [id number]   Basic Metabolic Panel: Recent Labs  Lab 08/19/2020 1458 09/11/20 0345  NA 124* 125*  K 5.4* 4.9  CL 87* 89*  CO2 26 24  GLUCOSE 183* 173*  BUN 29* 24*  CREATININE 1.56* 1.35*  CALCIUM 9.4 8.9   GFR: Estimated Creatinine Clearance: 22 mL/min (A) (by C-G formula based on SCr of 1.35 mg/dL (H)). Liver Function Tests: Recent Labs  Lab 08/18/2020 1458 09/11/20 0345  AST 27 15  ALT 22 18  ALKPHOS 104 102  BILITOT 1.0 0.6  PROT 6.8 5.8*  ALBUMIN 3.1* 2.6*   Recent Labs  Lab 09/12/2020 1458  LIPASE 20   No results for input(s): AMMONIA in the last 168 hours. Coagulation Profile: Recent Labs  Lab 08/18/2020 1458  INR 2.3*   Cardiac Enzymes: No results for input(s): CKTOTAL, CKMB, CKMBINDEX, TROPONINI in the last 168 hours. BNP (last 3 results) No results for input(s): PROBNP in the last 8760 hours. HbA1C: Recent Labs    09/13/2020 1731  HGBA1C 7.2*   CBG: Recent Labs  Lab 09/14/2020 1648 09/05/2020 2224 09/11/20 0727 09/11/20 1251  GLUCAP 177* 150* 182* 216*   Lipid Profile: No results for input(s): CHOL, HDL, LDLCALC, TRIG, CHOLHDL, LDLDIRECT in the last 72 hours. Thyroid Function Tests: Recent Labs    09/07/2020 1731  TSH 1.618   Anemia Panel: No results for input(s): VITAMINB12, FOLATE, FERRITIN, TIBC, IRON, RETICCTPCT in the last 72 hours. Sepsis Labs: Recent Labs  Lab 09/02/2020 1458  LATICACIDVEN 1.7    Recent Results (from the past 240 hour(s))  Culture, blood (routine x 2)     Status: None (Preliminary result)   Collection Time: 09/04/2020  2:58 PM   Specimen: BLOOD  Result Value Ref Range Status   Specimen Description   Final    BLOOD LEFT ARM Performed at [facility], [place] [address], [place], [place] [address]    Special Requests   Final    BOTTLES DRAWN AEROBIC AND ANAEROBIC Blood Culture  adequate volume Performed at [facility] [address], [place], [place] [address]    Culture   Final    NO GROWTH < 24 HOURS Performed at [facility] [address] [place], [place] [address]    Report Status PENDING  Incomplete  Culture, blood (routine x 2)     Status: None (Preliminary result)   Collection Time: 08/19/2020  2:58 PM   Specimen: BLOOD  Result Value Ref Range Status   Specimen Description   Final    BLOOD LEFT ARM Performed at [facility] [address] [place], [place] [address]    Special Requests   Final    BOTTLES DRAWN AEROBIC AND ANAEROBIC Blood Culture adequate volume Performed at [facility] [address], [place], [place] [address]    Culture   Final    NO GROWTH < 24 HOURS Performed at [facility] [address] [place], [place] [address]    Report Status PENDING  Incomplete  Resp Panel by RT-PCR (Flu A&B, Covid) Nasopharyngeal Swab     Status: None   Collection Time: 08/20/2020  3:00 PM   Specimen: Nasopharyngeal Swab; Nasopharyngeal(NP) swabs in vial transport medium  Result Value Ref Range Status   SARS Coronavirus 2 by RT PCR NEGATIVE NEGATIVE Final    Comment: (NOTE) SARS-CoV-2 target nucleic acids are NOT DETECTED.  The SARS-CoV-2 RNA is generally detectable in upper respiratory specimens during the acute phase of infection. The lowest concentration of SARS-CoV-2 viral copies this assay can  detect is 138 copies/mL. A negative result does not preclude SARS-Cov-2 infection and should not be used as the sole basis for treatment or other patient management decisions. A negative result may occur with  improper specimen collection/handling, submission of specimen other than nasopharyngeal swab, presence of viral mutation(s) within the areas targeted by this assay, and inadequate number of viral copies(<138 copies/mL). A negative result must be combined with clinical  observations, patient history, and epidemiological information. The expected result is Negative.  Fact Sheet for Patients:  EntrepreneurPulse.com.au  Fact Sheet for Healthcare Providers:  IncredibleEmployment.be  This test is no t yet approved or cleared by the [place] FDA and  has been authorized for detection and/or diagnosis of SARS-CoV-2 by FDA under an Emergency Use Authorization (EUA). This EUA will remain  in effect (meaning this test can be used) for the duration of the COVID-19 declaration under Section 564(b)(1) of the Act, 21 U.S.C.section 360bbb-3(b)(1), unless the authorization is terminated  or revoked sooner.       Influenza A by PCR NEGATIVE NEGATIVE Final   Influenza B by PCR NEGATIVE NEGATIVE Final    Comment: (NOTE) The Xpert Xpress SARS-CoV-2/FLU/RSV plus assay is intended as an aid in the diagnosis of influenza from Nasopharyngeal swab specimens and should not be used as a sole basis for treatment. Nasal washings and aspirates are unacceptable for Xpert Xpress SARS-CoV-2/FLU/RSV testing.  Fact Sheet for Patients: EntrepreneurPulse.com.au  Fact Sheet for Healthcare Providers: IncredibleEmployment.be  This test is not yet approved or cleared by the [place] FDA and has been authorized for detection and/or diagnosis of SARS-CoV-2 by FDA under an Emergency Use Authorization (EUA). This EUA will remain in effect (meaning this test can be used) for the duration of the COVID-19 declaration under Section 564(b)(1) of the Act, 21 U.S.C. section 360bbb-3(b)(1), unless the authorization is terminated or revoked.  Performed at [facility], [place] [address] [place], [place] [address]      RN Pressure Injury Documentation:     Estimated body mass index is 19.25 kg/m as calculated from the following:   Height as of 08/20/20: '5\' 3"'$  (1.6 m).   Weight as of this  encounter: 49.3 kg.  Malnutrition Type:   Malnutrition Characteristics:   Nutrition Interventions:   Radiology Studies: DG Chest 1 View  Result Date: 09/11/2020 CLINICAL DATA:  Post thoracentesis. EXAM: CHEST  1 VIEW COMPARISON:  CT 09/04/2020.  Chest x-ray 09/14/2020. FINDINGS: Cardiac pacer in stable position. Heart size stable. Persistent right lower lobe atelectasis/consolidation and diffuse bilateral pulmonary infiltrates/edema. Persistent moderate right pleural effusion. A small collection of pleural air is noted on the right following thoracentesis. Degenerative change thoracic spine. IMPRESSION: 1. Small collection of air in the right pleural space following thoracentesis. No prominent pneumothorax. These findings were given to PA Allred at the time of this dictation. 2. Persistent right lower lobe atelectasis/consolidation and bilateral pulmonary infiltrates/edema. 3.  Cardiac pacer in stable position. Electronically Signed   By: Marcello Moores  Register   On: 09/11/2020 13:00   CT Chest Wo Contrast  Result Date: 08/19/2020 CLINICAL DATA:  Abnormal xray - pleural effusion Cough. EXAM: CT CHEST WITHOUT CONTRAST TECHNIQUE: Multidetector CT imaging of the chest was performed following the standard protocol without IV contrast. COMPARISON:  Radiograph earlier today. FINDINGS: Cardiovascular: Pacemaker in place with leads in the right atrium and ventricle. Borderline cardiomegaly. There are coronary artery calcifications. Mild mitral annulus calcifications. Aortic atherosclerosis and tortuosity. Mediastinum/Nodes: Detailed assessment for adenopathy is limited  in the absence of IV contrast as well as paucity of mediastinal fat. Probable 11 mm right paratracheal node, series 2, image 51. 10 mm anterior paratracheal node, series 2, image 58. Mid and upper esophagus is patulous. No obvious thyroid nodule. Lungs/Pleura: Moderate right pleural effusion which is partially loculated as well as tracking into the  fissures. Debris/retained secretions noted within the dependent trachea, extending into the right and left mainstem bronchi. There is complete filling of the right lower lobe bronchus, segmental bronchi, and some segmental branches. Near complete opacification of the right lower lobe likely combination of postobstructive and compressive atelectasis. There is complete opacification of the right middle lobe, primarily due to compressive change from pleural fluid, components of bronchial filling is also seen. There is bronchial filling involving the anterior right upper lobe segmental branches. No obvious centrally obstructing mass, although evaluation is limited on this noncontrast exam. There is airway thickening throughout the right upper lobe. Air a filling of the segmental branches of the left lower lobe with patchy and ground-glass peripheral opacity at the left lung base. Small left pleural effusion. Perifissural patchy ground-glass opacity in the dependent perifissural left upper lobe as well as lingula. Occasional areas of septal thickening. Upper Abdomen: Motion artifact through the upper abdomen. No obvious acute finding. Calcified granuloma in the liver. Atherosclerosis of upper abdominal vasculature. Musculoskeletal: Advanced degenerative disc disease at T1-T2 with Modic endplate changes. Mild thoracic scoliosis and degenerative change. Focal bone lesion or acute osseous abnormality. No chest wall soft tissue abnormality. IMPRESSION: 1. Moderate right pleural effusion which is partially loculated as well as tracking into the fissures. 2. Significant debris retained secretions in the tracheobronchial tree, with filling of the right greater than left bronchi. Complete right middle lobe and near complete opacification of the right lower lobe likely combination of postobstructive and compressive atelectasis. There is complete filling of the right lower lobe bronchus, segmental bronchi, and some segmental  branches. Possibility of aspiration is raised given patulous esophagus. 3. Consider follow-up imaging after improvement of pleural effusion and re-expansion of the right lung. No obvious centrally obstructing or pulmonary mass. 4. Small left pleural effusion. Patchy airspace disease in the dependent left upper lobe, lingula, and left lower lobe may be pneumonia or aspiration. 5. Few prominent mediastinal lymph nodes are likely reactive. Detailed mediastinal assessment is limited on this noncontrast exam. 6. Borderline cardiomegaly with coronary artery calcifications. Aortic atherosclerosis. Aortic Atherosclerosis (ICD10-I70.0). Electronically Signed   By: Keith Rake M.D.   On: 08/21/2020 18:15   DG Chest Port 1 View  Result Date: 08/22/2020 CLINICAL DATA:  Dyspnea.  Shortness of breath.  Cough. EXAM: PORTABLE CHEST 1 VIEW COMPARISON:  08/20/2015 FINDINGS: Large right pleural effusion occupies the lower 1/2 of right hemithorax, and tracks laterally at the apex. Trace left pleural effusion suspected. Left-sided pacemaker in place. Heart size grossly normal, right heart border obscured. Aortic atherosclerosis. There is vascular congestion with mild interstitial thickening, possible edema. No pneumothorax. Bones are under mineralized without acute osseous abnormality. IMPRESSION: 1. Large right pleural effusion spanning the lower 1/2 of the right hemithorax. 2. Trace left pleural effusion suspected. 3. Vascular congestion with possible interstitial edema. Electronically Signed   By: Keith Rake M.D.   On: 08/21/2020 15:50   ECHOCARDIOGRAM COMPLETE  Result Date: 09/11/2020    ECHOCARDIOGRAM REPORT   Patient Name:   SARADA DEVANNEY Date of Exam: 09/11/2020 Medical Rec #:  [id number]     Height:  63.0 in Accession #:    [id number]    Weight:       108.7 lb Date of Birth:  12/22/1928     BSA:          1.492 m Patient Age:    11 years      BP:           137/47 mmHg Patient Gender: F             HR:            80 bpm. Exam Location:  Inpatient Procedure: 2D Echo, Color Doppler and Cardiac Doppler Indications:    CHF-Acute Diastolic [id number]  History:        Patient has prior history of Echocardiogram examinations, most                 recent 01/26/2017. TIA and COPD, Arrythmias:Atrial Fibrillation;                 Risk Factors:Hypertension and Diabetes.  Sonographer:    Bernadene Person RDCS Referring Phys: [place]  1. Left ventricular ejection fraction, by estimation, is 60 to 65%. The left ventricle has normal function. The left ventricle has no regional wall motion abnormalities. Left ventricular diastolic function could not be evaluated.  2. Right ventricular systolic function is normal. The right ventricular size is normal. There is mildly elevated pulmonary artery systolic pressure.  3. Left atrial size was mildly dilated.  4. Right atrial size was mildly dilated.  5. The mitral valve is normal in structure. Trivial mitral valve regurgitation. No evidence of mitral stenosis.  6. Tricuspid valve regurgitation is mild to moderate.  7. The aortic valve is normal in structure. Aortic valve regurgitation is not visualized. No aortic stenosis is present.  8. The inferior vena cava is normal in size with greater than 50% respiratory variability, suggesting right atrial pressure of 3 mmHg. FINDINGS  Left Ventricle: Left ventricular ejection fraction, by estimation, is 60 to 65%. The left ventricle has normal function. The left ventricle has no regional wall motion abnormalities. The left ventricular internal cavity size was normal in size. There is  no left ventricular hypertrophy. Left ventricular diastolic function could not be evaluated due to atrial fibrillation. Left ventricular diastolic function could not be evaluated. Right Ventricle: The right ventricular size is normal. No increase in right ventricular wall thickness. Right ventricular systolic function is normal. There is mildly elevated  pulmonary artery systolic pressure. The tricuspid regurgitant velocity is 2.91  m/s, and with an assumed right atrial pressure of 3 mmHg, the estimated right ventricular systolic pressure is 123456 mmHg. Left Atrium: Left atrial size was mildly dilated. Right Atrium: Right atrial size was mildly dilated. Pericardium: There is no evidence of pericardial effusion. Mitral Valve: The mitral valve is normal in structure. Mild mitral annular calcification. Trivial mitral valve regurgitation. No evidence of mitral valve stenosis. Tricuspid Valve: The tricuspid valve is normal in structure. Tricuspid valve regurgitation is mild to moderate. No evidence of tricuspid stenosis. Aortic Valve: The aortic valve is normal in structure. Aortic valve regurgitation is not visualized. No aortic stenosis is present. Pulmonic Valve: The pulmonic valve was normal in structure. Pulmonic valve regurgitation is mild. No evidence of pulmonic stenosis. Aorta: The aortic root is normal in size and structure. Venous: The inferior vena cava is normal in size with greater than 50% respiratory variability, suggesting right atrial pressure of 3 mmHg. IAS/Shunts: No atrial level shunt detected  by color flow Doppler. Additional Comments: A device lead is visualized in the right atrium and right ventricle.  LEFT VENTRICLE PLAX 2D LVIDd:         3.80 cm LVIDs:         2.30 cm LV PW:         0.80 cm LV IVS:        1.00 cm LVOT diam:     1.50 cm LV SV:         50 LV SV Index:   34 LVOT Area:     1.77 cm  RIGHT VENTRICLE TAPSE (M-mode): 1.6 cm LEFT ATRIUM             Index       RIGHT ATRIUM           Index LA diam:        3.70 cm 2.48 cm/m  RA Area:     17.60 cm LA Vol (A2C):   48.7 ml 32.64 ml/m RA Volume:   53.00 ml  35.52 ml/m LA Vol (A4C):   48.9 ml 32.77 ml/m LA Biplane Vol: 49.8 ml 33.37 ml/m  AORTIC VALVE LVOT Vmax:   168.33 cm/s LVOT Vmean:  110.667 cm/s LVOT VTI:    0.283 m  AORTA Ao Root diam: 2.80 cm Ao Asc diam:  2.90 cm TRICUSPID VALVE  TR Peak grad:   33.9 mmHg TR Vmax:        291.00 cm/s  SHUNTS Systemic VTI:  0.28 m Systemic Diam: 1.50 cm Dani Gobble Croitoru MD Electronically signed by Sanda Klein MD Signature Date/Time: 09/11/2020/11:06:34 AM    Final    Scheduled Meds: . Chlorhexidine Gluconate Cloth  6 each Topical Daily  . escitalopram  20 mg Oral Daily  . guaiFENesin  1,200 mg Oral BID  . insulin aspart  0-9 Units Subcutaneous TID WC  . ipratropium  0.5 mg Nebulization Q6H  . levalbuterol  0.63 mg Nebulization Q6H  . levothyroxine  50 mcg Oral QAC breakfast  . lidocaine      . melatonin  5 mg Oral QHS  . mirtazapine  30 mg Oral QHS  . Rivaroxaban  15 mg Oral Q lunch  . sodium chloride flush  3 mL Intravenous Q12H   Continuous Infusions: . sodium chloride    . azithromycin Stopped (08/25/2020 1802)  . cefTRIAXone (ROCEPHIN)  IV Stopped (08/20/2020 1701)    LOS: 0 days   Kerney Elbe, DO [facility] PAGER is on Standard  If 7PM-7AM, please contact night-coverage [email or web address]

## 2020-09-11 NOTE — Evaluation (Signed)
Clinical/Bedside Swallow Evaluation Patient Details  Name: [name] MRN: [id number] Date of Birth: [birth year]  Today's Date: 09/11/2020 Time: SLP Start Time (ACUTE ONLY): 1400 SLP Stop Time (ACUTE ONLY): 1430 SLP Time Calculation (min) (ACUTE ONLY): 30 min  Past Medical History:  Past Medical History:  Diagnosis Date  . Ambulates with cane    straight  . Anxiety   . Arthritis    lower back  . CKD (chronic kidney disease), stage III ([place])   . Closed fracture of left olecranon process   . COPD (chronic obstructive pulmonary disease) ([place])   . Depression   . Diabetes mellitus without complication ([place])    type 2  . Headache   . Hypertension   . Orthostatic hypotension   . Osteopenia   . Pacemaker   . 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 Permanent atrial fibrillation ([place])    a.  s/p AVN ablation 2010 with complete heart block s/p PPM (gen change 2015, pt preference for Coumadin).  . Protein calorie malnutrition ([place])   . S/P AV nodal ablation   . TIA (transient ischemic attack)    Past Surgical History:  Past Surgical History:  Procedure Laterality Date  . ABLATION  2012   AVN ablation by Dr Lovena Le  . cateracts Bilateral    eyes  . CYST REMOVAL TRUNK     from breast  . I & D EXTREMITY Left 11/04/2018   Procedure: IRRIGATION AND DEBRIDEMENT LEFT ELBOW, WOUND VAC;  Surgeon: Leandrew Koyanagi, MD;  Location: [facility];  Service: Orthopedics;  Laterality: Left;  . I & D EXTREMITY Left 11/15/2018   Procedure: IRRIGATION AND DEBRIDEMENT LEFT ELBOW, WOUND VAC;  Surgeon: Leandrew Koyanagi, MD;  Location: [facility];  Service: Orthopedics;  Laterality: Left;  . ORIF ELBOW FRACTURE Left 10/07/2018   Procedure: OPEN REDUCTION INTERNAL FIXATION (ORIF) LEFT OLECRANON FRACTURE;  Surgeon: Leandrew Koyanagi, MD;  Location: [facility];  Service: Orthopedics;  Laterality: Left;  . PACEMAKER INSERTION    . PERMANENT PACEMAKER GENERATOR CHANGE N/A  07/18/2013   Procedure: PERMANENT PACEMAKER GENERATOR CHANGE;  Surgeon: Deboraha Sprang, MD;  Location: [facility] CATH LAB;  Service: Cardiovascular;  Laterality: N/A;  . WISDOM TOOTH EXTRACTION     HPI:  87yo female admitted 09/13/2020 with weakness, shoulder pain. PMH: COPD, TIA, longstanding tobacco use, DM2, CKD3a, HTN, anxiety, depression, AFib s/p ablation/pacemaker. CXR = large right pleural effusion with vascular congestion   Assessment / Plan / Recommendation Clinical Impression  Pt seen at bedside for limited swallow evaluation to determine safe diet. Pt exhibits mild generalized weakness orally, with dry mouth at baseline. Adequate natural dentition noted. Pt exhibits congested nonproductive cough at baseline. Oral care was completed with suction. Pt was encouraged to cough and bring up phlegm to be suctioned out. This elicited a gag reflex and subsequent vomiting. O2 sats were noted to varying widely during this assessment, dropping into the low 70's, back up into the mid 80's. Given pt lethargy, vomiting, and decreased management of secretions, recommend NPO status at this time. SLP will recheck for improvement and readiness for PO intake next date.    SLP Visit Diagnosis: Dysphagia, unspecified (R13.10)    Aspiration Risk  Severe aspiration risk;Risk for inadequate nutrition/hydration    Diet Recommendation NPO   Medication Administration: Via alternative means    Other  Recommendations Oral Care Recommendations: Oral care QID Other Recommendations: Have oral suction available  Follow up Recommendations Other (comment) (TBD)      Frequency and Duration min 1 x/week  1 week;2 weeks       Prognosis Prognosis for Safe Diet Advancement: Fair      Swallow Study   General HPI: 87yo female admitted 09/14/2020 with weakness, shoulder pain. PMH: COPD, TIA, longstanding tobacco use, DM2, depression, CKD3a, HTN, anxiety, depression, AFib s/p ablation/pacemaker. CXR = large right pleural  effusion with vascular congestion Type of Study: Bedside Swallow Evaluation Previous Swallow Assessment: none Diet Prior to this Study: NPO Temperature Spikes Noted: No Respiratory Status: Nasal cannula History of Recent Intubation: No Behavior/Cognition: Confused;Cooperative;Lethargic/Drowsy Oral Cavity Assessment: Dry Oral Care Completed by SLP: Yes Oral Cavity - Dentition: Adequate natural dentition Self-Feeding Abilities: Total assist Patient Positioning: Upright in bed Baseline Vocal Quality: Normal Volitional Cough: Congested Volitional Swallow: Unable to elicit    Oral/Motor/Sensory Function Overall Oral Motor/Sensory Function: Mild impairment   [name] B. [name], [facility], [place] Speech Language Pathologist Office: [phone] Pager: [phone]  Shonna Chock 09/11/2020,2:40 PM

## 2020-09-11 NOTE — Progress Notes (Signed)
Pharmacy Antibiotic Note  [name] is a 87 y.o. female admitted on 08/23/2020 with respiratory failure.  Pharmacy has been consulted for vancomycin + cefepime dosing for PNA.  Today, 09/11/20  WBC elevated (42.7)  SCr 1.35, CrCl ~22 mL/min  Antibiotics being broadened from ceftriaxone + azithromycin to vancomycin + cefepime.   Plan:  Discontinue ceftriaxone  Cefepime 1 g IV q12h  Metronidazole 500 mg IV q8h per provider  Vancomycin 1000 mg LD followed by 750 mg IV q48h  Goal vancomycin AUC 400-550. Check levels at steady state as needed  Ordered MRSA PCR  Weight: 49.3 kg (108 lb 11 oz)  Temp (24hrs), Avg:97.8 F (36.6 C), Min:97.4 F (36.3 C), Max:98.4 F (36.9 C)  Recent Labs  Lab 08/18/2020 1458 09/11/20 0345  WBC 39.0* 42.7*  CREATININE 1.56* 1.35*  LATICACIDVEN 1.7  --     Estimated Creatinine Clearance: 22 mL/min (A) (by C-G formula based on SCr of 1.35 mg/dL (H)).    Allergies  Allergen Reactions  . Eliquis [Apixaban] Other (See Comments)    Made pt have weak muscles  . Gabapentin Nausea Only and Other (See Comments)    SHAKES AND NAUSEA   . Rozerem [Ramelteon] Other (See Comments)    DIZZY AND HEAD THROBBING  . Tikosyn [Dofetilide] Itching and Swelling    Mouth swells, itching  . Tradjenta [Linagliptin] Diarrhea and Other (See Comments)    Joint Pain, Stomach issues  . Temazepam Other (See Comments)    INSOMNIA    Antimicrobials this admission: ceftriaxone 4/25 >> 4/26 azithromycin 4/25 >>  Vancomycin 4/26 >> Cefepime 4/26 >> Metronidazole 4/26 >>  Dose adjustments this admission:  Microbiology results: 4/25 BCx: ngtd 4/25 UCx:   4/26 MRSA PCR: Ordered  Thank you for allowing pharmacy to be a part of this patient's care.  Lenis Noon, PharmD 09/11/2020 5:17 PM

## 2020-09-11 NOTE — Progress Notes (Incomplete)
87 year old smoker with large right loculated pleural effusion for which we are consulted by Dr. Shelton Silvas from [facility]. Her husband passed away a week ago, she was admitted with generalized weakness, poor oral intake, found to be hypoxic requiring 4 L and WBC count of 30 9K. Chest x-ray showed right pleural effusion. I personally reviewed CT chest which showed moderate loculated right pleural effusion and some secretions on the right-sided bronchi. Thoracentesis was attempted, 60 cc was aspirated, I reviewed ultrasound images which shows multiple septic suggestive of a multiloculated effusion.  Pleural fluid analysis shows neutrophilic exudate with low glucose suggestive of empyema  She is minimally responsive, reviewed medications last dose of Zofran was in the morning.  She sticks out her tongue to command but does not follow other commands, minimally responds to her name.  Decreased breath sounds on right, S1-S2 regular, cool extremities, oxygen saturation 94% on 15 L nasal cannula.  Labs show hyponatremia, extreme leukocytosis, creatinine 1.3.  Impression :right empyema Severe sepsis causing encephalopathy Acute hypoxic respiratory failure  Detailed discussion with her Sister Myra at the bedside who is a retired Marine scientist and her son.  Explained that normal unfortunately is not a surgical candidate due to her severe hypoxia and comorbidities and advanced age.  Options include placement of pigtail chest tube with intrapleural lytics versus antibiotic's alone. She was willing to proceed with intrapleural lytics.  We requested IR but given the lateness of the hour, and emergent need for procedure.  Procedure was performed at the bedside with rapid response nurse present. Follow-up chest x-ray shows good placement of pigtail catheter. Intrapleural tPA and dornase will be instilled for 1 hour and we will see how much drainage we get. We will change antibiotics to cefepime Flagyl and vancomycin  while awaiting pleural fluid cultures If she gets worse, sister is agreeable to full comfort care

## 2020-09-11 NOTE — Progress Notes (Signed)
Patient has a stat order for a thoracentesis. [place] called for update on procedure. Per [place] department "we were informed thoracentesis to be performed tomorrow morning." [facility] on call provider Dr. Sabino Gasser called to clarify order and to provide update on patient's condition. Patient on 8L HFNC spO2 at 94%. Rhonchi heard without auscultation. CT reviewed. Per MD okay to postpone US thoracentesis until morning. Will continue to monitor patient closely.

## 2020-09-11 NOTE — Procedures (Signed)
Pleural Fibrinolytic Administration Procedure Note  [name]  [id number]  [birth year]  Date:09/11/20  Time:7:02 PM   Provider Performing:Abraham Margulies Jerilynn Mages Ayesha Rumpf   Procedure: Pleural Fibrinolysis Initial day [id number])  Indication(s) Fibrinolysis of complicated pleural effusion  Consent Risks of the procedure as well as the alternatives and risks of each were explained to the patient and/or caregiver.  Consent for the procedure was obtained.  Anesthesia None  Time Out Verified patient identification, verified procedure, site/side was marked, verified correct patient position, special equipment/implants available, medications/allergies/relevant history reviewed, required imaging and test results available.  Sterile Technique Hand hygiene, gloves  Procedure Description Existing pleural catheter was cleaned and accessed in sterile manner.  '10mg'$  of tPA in 30cc of saline and '5mg'$  of dornase in 30cc of sterile water were injected into pleural space using existing pleural catheter.  Catheter will be clamped for 1 hour and then placed back to suction.  Complications/Tolerance None; patient tolerated the procedure well.  EBL None  Specimen(s) None  Lestine Mount, [place] [facility] 09/11/20 7:02 PM  Please see [email or web address] for pager details.

## 2020-09-11 NOTE — Procedures (Signed)
Ultrasound-guided diagnostic and therapeutic right thoracentesis performed yielding 60 cc of turbid, yellow fluid. No immediate complications. Follow-up chest x-ray pending. Due to the extensive multiloculated nature of the pleural fluid only the above amount could be aspirated today. The fluid was sent to the lab for preordered studies. EBL none. Consider TCTS /pulm consult.

## 2020-09-11 NOTE — Consult Note (Addendum)
NAME:  [name], MRN:  [id number], DOB:  03/18/1929, LOS: 0 ADMISSION DATE:  09/07/2020, CONSULTATION DATE:  09/11/2020 REFERRING MD:  Raiford Noble Latif CHIEF COMPLAINT:  Loculated R pleural effusion   History of Present Illness:  87 year old female, smoker, with PMHx significant for HTN, T2DM, CKD stage IIIa, Afib (s/p ablation, pacemaker placement) and COPD who presented to [facility] 4/25 for shoulder pain, weakness and SOB requiring 4L [place]. Found to have large R pleural effusion on CXR with concern for vascular congestion and ?pulmonary edema. Empirically treated for CAP (ceftriaxone, azithromycin), given WBC 39K on admission. S/p thoracentesis with IR 4/26.  PCCM consulted for assistance with management.  Pertinent Medical History:   Past Medical History:  Diagnosis Date  . Ambulates with cane    straight  . Anxiety   . Arthritis    lower back  . CKD (chronic kidney disease), stage III ([place])   . Closed fracture of left olecranon process   . COPD (chronic obstructive pulmonary disease) ([place])   . Depression   . Diabetes mellitus without complication ([place])    type 2  . Headache   . Hypertension   . Orthostatic hypotension   . Osteopenia   . Pacemaker   . 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 Permanent atrial fibrillation ([place])    a.  s/p AVN ablation 2010 with complete heart block s/p PPM (gen change 2015, pt preference for Coumadin).  . Protein calorie malnutrition ([place])   . S/P AV nodal ablation   . TIA (transient ischemic attack)    Significant Hospital Events: Including procedures, antibiotic start and stop dates in addition to other pertinent events    4/25 - Admitted via [facility] ED for SOB, shoulder pain; CXR demonstrating large R pleural effusion. Ceftriaxone/Azithromycin started for empiric CAP.  4/26 - IR thoracentesis with 30m fluid, 12K cells (88% N), exudative. PCCM consulted. Pigtail placed at bedside and lytics ordered.  Abx broadened to cefepime/flagyl.  Interim History / Subjective:    Objective   Blood pressure (!) 135/54, pulse 80, temperature (!) 97.5 F (36.4 C), temperature source Oral, resp. rate (!) 22, weight 49.3 kg, SpO2 90 %.        Intake/Output Summary (Last 24 hours) at 09/11/2020 1449 Last data filed at 09/11/2020 0520 Gross per 24 hour  Intake 2419.81 ml  Output 1275 ml  Net 1144.81 ml   Filed Weights   09/09/2020 2036  Weight: 49.3 kg   Physical Examination: General: Chronically ill-appearing elderly woman, agonal breathing. Cachectic. HEENT: [place]/AT, anicteric sclera, PERRL, dry mucous membranes, mouth breathing. Neuro: Lethargic. Responds to verbal stimuli. Following commands intermittently. Moves all 4 extremities spontaneously. CV: RRR, no m/g/r. PULM: Breathing even and mildly labored/agonal-appearing on 15L [place]. Lung fields with coarse rhonchi and bilateral crackles, R > L. GI: Soft, nontender, nondistended. Normoactive bowel sounds. Extremities: No LE edema noted. Skin: Warm/dry, no rashes.  Labs/imaging that I have personally reviewed: (right click and "Reselect all SmartList Selections" daily)  WBC 42.7 (39), H&H 12.7/39.1, Plt 194  Na 125, K 4.9, Bicarb 24, Cr 1.35 (1.5)  Pleural fluid studies: Cell count 12,570 (88% N) Protein 3.8 LDH 1617 Gluc 42 PH, Fluid Culture pending  CXR 4/25 Large R pleural effusion spanning the lower half of the R hemithorax  Noncon CT Chest 4/25 Moderate R pleural effusion (partially loculated) tracking into the fissures, significant debris retained secretions in the tracheobronchial tree with filling of bronchi R >  L, complete RML and near-complete opacification of RLL; complete filling of RLL bronchus, segmental bronchi, and some segmental branches, few prominent mediastinal LNs  Resolved Hospital Problem List    Assessment & Plan:  Acute hypoxic respiratory failure secondary to loculated exudative right pleural  effusion History of COPD Active smoker Presented to [facility] ED 4/25 for SOB, R shoulder pain (pleuritic) and weakness. History of COPD with longstanding tobacco abuse history; does not require supplemental O2 at baseline. Now requiring 15L [place]. Minimally responsive on exam. CXR demonstrated significant R pleural effusion. CT Chest demonstrated significant loculated R pleural effusion, concerning for infection. Thoracentesis was completed with IR 4/26 with sample demonstrating 12K nucleated cells (88% neutrophils) and properties of exudative effusion. Given loculated nature of effusion, only 39m was able to be aspirated. PCCM was subsequently consulted for further management.  Of note, patient's sister (at bedside) states that patient's husband just passed 4/18 and she believes this was from sepsis. Patient has not eaten or been very active since his passing and as a result is severely deconditioned beyond her baseline (she and husband previously lived independently without assistance). Patient is a DNR (new this admission).  When considering patient's severely deconditioned status, desire for DNR and frailty in the setting of likely sepsis secondary to pneumonia, we do not feel patient would be a surgical candidate for VATS or similar definitive management at this time. As such, would suggest chest tube placement and trial of lytics to attempt to break up loculations for improved drainage. Additionally, could attempt IR CT-guided drain. Discussed with patient's sister/nephew at bedside, if patient does not improve with these interventions or continues to worsen, would likely recommend transition to comfort.  Plan: - S/p R pleural pigtail placement at bedside with PCCM - Lytics to be administered 4/26PM and tube opened 1 hour post-administration - Monitor chest tube output post-lytics - Repeat CXR 4/27AM - Consider IR CT-guided catheter placement 4/27 if no significant improvement in clinical  status/respiratory needs/fluid drainage  Best practice (right click and "Reselect all SmartList Selections" daily)  Per Primary Team  Labs   CBC: Recent Labs  Lab 08/21/2020 1458 09/11/20 0345  WBC 39.0* 42.7*  NEUTROABS 35.4*  --   HGB 13.8 12.7  HCT 42.2 39.1  MCV 95.5 95.1  PLT 203 [id number]  Basic Metabolic Panel: Recent Labs  Lab 08/24/2020 1458 09/11/20 0345  NA 124* 125*  K 5.4* 4.9  CL 87* 89*  CO2 26 24  GLUCOSE 183* 173*  BUN 29* 24*  CREATININE 1.56* 1.35*  CALCIUM 9.4 8.9   GFR: Estimated Creatinine Clearance: 22 mL/min (A) (by C-G formula based on SCr of 1.35 mg/dL (H)). Recent Labs  Lab 08/24/2020 1458 09/11/20 0345  WBC 39.0* 42.7*  LATICACIDVEN 1.7  --    Liver Function Tests: Recent Labs  Lab 08/29/2020 1458 09/11/20 0345  AST 27 15  ALT 22 18  ALKPHOS 104 102  BILITOT 1.0 0.6  PROT 6.8 5.8*  ALBUMIN 3.1* 2.6*   Recent Labs  Lab 08/26/2020 1458  LIPASE 20   No results for input(s): AMMONIA in the last 168 hours.  ABG:    Component Value Date/Time   TCO2 26 09/17/2014 2217    Coagulation Profile: Recent Labs  Lab 09/14/2020 1458  INR 2.3*   Cardiac Enzymes: No results for input(s): CKTOTAL, CKMB, CKMBINDEX, TROPONINI in the last 168 hours.  HbA1C: Hgb A1c MFr Bld  Date/Time Value Ref Range Status  08/23/2020 05:31 PM 7.2 (H)  4.8 - 5.6 % Final    Comment:    (NOTE) Pre diabetes:          5.7%-6.4%  Diabetes:              >6.4%  Glycemic control for   <7.0% adults with diabetes   10/21/2018 11:25 AM 7.2 (H) <5.7 % of total Hgb Final    Comment:    For someone without known diabetes, a hemoglobin A1c value of 6.5% or greater indicates that they may have  diabetes and this should be confirmed with a follow-up  test. . For someone with known diabetes, a value <7% indicates  that their diabetes is well controlled and a value  greater than or equal to 7% indicates suboptimal  control. A1c targets should be individualized based  on  duration of diabetes, age, comorbid conditions, and  other considerations. . Currently, no consensus exists regarding use of hemoglobin A1c for diagnosis of diabetes for children. .    CBG: Recent Labs  Lab 08/20/2020 1648 08/17/2020 2224 09/11/20 0727 09/11/20 1251  GLUCAP 177* 150* 182* 216*   Review of Systems:   Review of systems completed with pertinent positives/negatives outlined in above HPI.  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   Surgical History:   Past Surgical History:  Procedure Laterality Date  . ABLATION  2012   AVN ablation by Dr Lovena Le  . cateracts Bilateral    eyes  . CYST REMOVAL TRUNK     from breast  . I & D EXTREMITY Left 11/04/2018   Procedure: IRRIGATION AND DEBRIDEMENT LEFT ELBOW, WOUND VAC;  Surgeon: Leandrew Koyanagi, MD;  Location: [place];  Service: Orthopedics;  Laterality: Left;  . I & D EXTREMITY Left 11/15/2018   Procedure: IRRIGATION AND DEBRIDEMENT LEFT ELBOW, WOUND VAC;  Surgeon: Leandrew Koyanagi, MD;  Location: [place];  Service: Orthopedics;  Laterality: Left;  . ORIF ELBOW FRACTURE Left 10/07/2018   Procedure: OPEN REDUCTION INTERNAL FIXATION (ORIF) LEFT OLECRANON FRACTURE;  Surgeon: Leandrew Koyanagi, MD;  Location: [place];  Service: Orthopedics;  Laterality: Left;  . PACEMAKER INSERTION    . PERMANENT PACEMAKER GENERATOR CHANGE N/A 07/18/2013   Procedure: PERMANENT PACEMAKER GENERATOR CHANGE;   Surgeon: Deboraha Sprang, MD;  Location[facility] C[facility] LAB;  Service: Cardiovascular;  Laterality: N/A;  . WISDOM TOOTH EXTRACTION      Social History:   reports that she has been smoking cigarettes. She has a 33.50 pack-year smoking history. She has never used smokeless tobacco. She reports that she does not drink alcohol and does not use drugs.   Family History:  Her family history includes Stroke in her father.   Allergies: Allergies  Allergen Reactions  . Eliquis [Apixaban] Other (See Comments)    Made pt have weak muscles  . Gabapentin Nausea Only and Other (See Comments)    SHAKES AND NAUSEA   . Rozerem [Ramelteon] Other (See Comments)    DIZZY AND HEAD THROBBING  . Tikosyn [Dofetilide] Itching and Swelling    Mouth swells, itching  . Tradjenta [Linagliptin]  Diarrhea and Other (See Comments)    Joint Pain, Stomach issues  . Temazepam Other (See Comments)    INSOMNIA    Home Medications: Prior to Admission medications   Medication Sig Start Date End Date Taking? Authorizing Provider  calcium-vitamin D (OSCAL WITH D) 500-200 MG-UNIT TABS tablet TAKE 1 TABLET BY MOUTH THREE TIMES A DAY Patient taking differently: Take 1 tablet by mouth daily. 04/06/19  Yes Leandrew Koyanagi, MD  clonazePAM (KLONOPIN) 0.5 MG tablet TAKE 1 TABLET BY MOUTH EVERYDAY AT BEDTIME 02/01/19  Yes [provider]  escitalopram (LEXAPRO) 20 MG tablet Take 20 mg by mouth daily.  02/10/15  Yes [provider]  Insulin Glargine 300 UNIT/ML SOPN Inject 4 Units into the skin daily.    Yes [provider]  levothyroxine (SYNTHROID) 50 MCG tablet Take 50 mcg by mouth every morning. 07/22/19  Yes [provider]  losartan (COZAAR) 50 MG tablet Take 100 mg by mouth daily. 12/01/18  Yes [provider]  Melatonin 5 MG TABS Take 5 mg by mouth at bedtime.    Yes [provider]  mirtazapine (REMERON) 30 MG tablet Take 30 mg by mouth at bedtime. 08/13/20  Yes [provider]  Multiple Vitamin (MULTIVITAMIN) tablet Take 1 tablet by mouth daily.   Yes [provider]  pregabalin (LYRICA) 50 MG capsule Take 1 capsule (50 mg total) by mouth at bedtime. 04/17/20  Yes Hyatt, Max T, DPM  XARELTO 15 MG TABS tablet TAKE 1 TABLET (15 MG TOTAL) BY MOUTH DAILY WITH SUPPER. 03/27/20  Yes Skains, Thana Farr, MD  BD PEN NEEDLE NANO U/F 32G X 4 MM MISC AS DIRECTED USE WITH TOUJEO/ DX E11.49 IN VITRO 01/21/18   [provider]  pregabalin (LYRICA) 50 MG capsule Take 1 capsule (50 mg total) by mouth daily. Take one capsule (50 mg total) by mouth daily Patient not taking: No sig reported 04/17/20   Hyatt, Max T, DPM  PREVIDENT 5000 DRY MOUTH 1.1 % GEL dental gel Take 1 application by mouth See admin instructions. Use to brush teeth every day, replacement for regular toothpaste 08/23/18   [provider]    Critical care time: N/A   Lestine Mount, PA-C [facility] 09/11/20 2:49 PM  Please see [email or web address] for pager details.

## 2020-09-11 NOTE — Procedures (Signed)
Insertion of Chest Tube Procedure Note  [name]  [id number]  08-24-1928  Date:09/11/20  Time:5:34 PM    Provider Performing: Lestine Mount   Procedure: Pleural Catheter Insertion w/ Imaging Guidance [phone])  Indication(s) Effusion  Consent Risks of the procedure as well as the alternatives and risks of each were explained to the patient and/or caregiver.  Consent for the procedure was obtained and is signed in the bedside chart  Anesthesia Topical only with 1% lidocaine   Time Out Verified patient identification, verified procedure, site/side was marked, verified correct patient position, special equipment/implants available, medications/allergies/relevant history reviewed, required imaging and test results available.   Sterile Technique Maximal sterile technique including full sterile barrier drape, hand hygiene, sterile gown, sterile gloves, mask, hair covering, sterile ultrasound probe cover (if used).  Procedure Description Ultrasound used to identify appropriate pleural anatomy for placement and overlying skin marked. Area of placement cleaned and draped in sterile fashion.  A 14 French pigtail pleural catheter was placed into the right pleural space using Seldinger technique. Appropriate return of fluid was obtained.  The tube was connected to atrium and was not placed to suction, given plan for pleural lytic administration.   Complications/Tolerance None; patient tolerated the procedure well. Chest X-ray is ordered to verify placement.   EBL Minimal  Specimen(s) none   The entire procedure was supervised/proctored by Kara Mead, MD.  Lestine Mount, PA-C [facility] 09/11/20 5:35 PM  Please see [email or web address] for pager details.

## 2020-09-11 NOTE — Progress Notes (Signed)
SLP Cancellation Note  Patient Details Name: [name] MRN: [id number] DOB: 24-Nov-1928   Cancelled treatment:       Reason Eval/Treat Not Completed: Other (comment) Unable to complete swallow evaluation at this time, as pt is currently NPO for procedure. Will continue efforts.  [name], [facility], [place] Speech Language Pathologist Office: [phone] Pager: [phone]  [name] 09/11/2020, 9:41 AM

## 2020-09-11 NOTE — Progress Notes (Signed)
PT Cancellation Note  Patient Details Name: [name] MRN: [id number] DOB: 05/17/1929   Cancelled Treatment:    Reason Eval/Treat Not Completed: Patient at procedure or test/unavailable   [facility] 09/11/2020, 11:37 AM

## 2020-09-12 ENCOUNTER — Encounter (HOSPITAL_COMMUNITY): Payer: Self-pay | Admitting: Internal Medicine

## 2020-09-12 ENCOUNTER — Inpatient Hospital Stay (HOSPITAL_COMMUNITY): Payer: Medicare Other

## 2020-09-12 DIAGNOSIS — G9341 Metabolic encephalopathy: Secondary | ICD-10-CM

## 2020-09-12 DIAGNOSIS — A419 Sepsis, unspecified organism: Secondary | ICD-10-CM | POA: Diagnosis not present

## 2020-09-12 DIAGNOSIS — R1310 Dysphagia, unspecified: Secondary | ICD-10-CM

## 2020-09-12 DIAGNOSIS — J9601 Acute respiratory failure with hypoxia: Secondary | ICD-10-CM | POA: Diagnosis not present

## 2020-09-12 DIAGNOSIS — J869 Pyothorax without fistula: Secondary | ICD-10-CM | POA: Diagnosis not present

## 2020-09-12 DIAGNOSIS — I4821 Permanent atrial fibrillation: Secondary | ICD-10-CM

## 2020-09-12 DIAGNOSIS — N183 Chronic kidney disease, stage 3 unspecified: Secondary | ICD-10-CM

## 2020-09-12 DIAGNOSIS — R6520 Severe sepsis without septic shock: Secondary | ICD-10-CM

## 2020-09-12 DIAGNOSIS — N1831 Chronic kidney disease, stage 3a: Secondary | ICD-10-CM

## 2020-09-12 LAB — CBC WITH DIFFERENTIAL/PLATELET
Abs Immature Granulocytes: 0.78 10*3/uL — ABNORMAL HIGH (ref 0.00–0.07)
Basophils Absolute: 0.2 10*3/uL — ABNORMAL HIGH (ref 0.0–0.1)
Basophils Relative: 0 %
Eosinophils Absolute: 0 10*3/uL (ref 0.0–0.5)
Eosinophils Relative: 0 %
HCT: 43.4 % (ref 36.0–46.0)
Hemoglobin: 13.6 g/dL (ref 12.0–15.0)
Immature Granulocytes: 2 %
Lymphocytes Relative: 2 %
Lymphs Abs: 0.7 10*3/uL (ref 0.7–4.0)
MCH: 30.9 pg (ref 26.0–34.0)
MCHC: 31.3 g/dL (ref 30.0–36.0)
MCV: 98.6 fL (ref 80.0–100.0)
Monocytes Absolute: 1.7 10*3/uL — ABNORMAL HIGH (ref 0.1–1.0)
Monocytes Relative: 4 %
Neutro Abs: 38.8 10*3/uL — ABNORMAL HIGH (ref 1.7–7.7)
Neutrophils Relative %: 92 %
Platelets: 236 10*3/uL (ref 150–400)
RBC: 4.4 MIL/uL (ref 3.87–5.11)
RDW: 14.1 % (ref 11.5–15.5)
WBC: 42.2 10*3/uL — ABNORMAL HIGH (ref 4.0–10.5)
nRBC: 0 % (ref 0.0–0.2)

## 2020-09-12 LAB — COMPREHENSIVE METABOLIC PANEL
ALT: 17 U/L (ref 0–44)
AST: 14 U/L — ABNORMAL LOW (ref 15–41)
Albumin: 2.5 g/dL — ABNORMAL LOW (ref 3.5–5.0)
Alkaline Phosphatase: 110 U/L (ref 38–126)
Anion gap: 14 (ref 5–15)
BUN: 38 mg/dL — ABNORMAL HIGH (ref 8–23)
CO2: 27 mmol/L (ref 22–32)
Calcium: 9.1 mg/dL (ref 8.9–10.3)
Chloride: 91 mmol/L — ABNORMAL LOW (ref 98–111)
Creatinine, Ser: 1.7 mg/dL — ABNORMAL HIGH (ref 0.44–1.00)
GFR, Estimated: 28 mL/min — ABNORMAL LOW (ref >60)
Glucose, Bld: 170 mg/dL — ABNORMAL HIGH (ref 70–99)
Potassium: 5 mmol/L (ref 3.5–5.1)
Sodium: 132 mmol/L — ABNORMAL LOW (ref 135–145)
Total Bilirubin: 0.8 mg/dL (ref 0.3–1.2)
Total Protein: 5.9 g/dL — ABNORMAL LOW (ref 6.5–8.1)

## 2020-09-12 LAB — GLUCOSE, CAPILLARY
Glucose-Capillary: 191 mg/dL — ABNORMAL HIGH (ref 70–99)
Glucose-Capillary: 214 mg/dL — ABNORMAL HIGH (ref 70–99)
Glucose-Capillary: 182 mg/dL — ABNORMAL HIGH (ref 70–99)
Glucose-Capillary: 173 mg/dL — ABNORMAL HIGH (ref 70–99)

## 2020-09-12 LAB — CYTOLOGY - NON PAP

## 2020-09-12 LAB — PROTIME-INR
INR: 1.2 (ref 0.8–1.2)
Prothrombin Time: 15.4 seconds — ABNORMAL HIGH (ref 11.4–15.2)

## 2020-09-12 LAB — URINE CULTURE: Culture: NO GROWTH

## 2020-09-12 LAB — PHOSPHORUS: Phosphorus: 5.8 mg/dL — ABNORMAL HIGH (ref 2.5–4.6)

## 2020-09-12 LAB — MAGNESIUM: Magnesium: 1.7 mg/dL (ref 1.7–2.4)

## 2020-09-12 LAB — MRSA PCR SCREENING: MRSA by PCR: NEGATIVE

## 2020-09-12 MED ORDER — IPRATROPIUM BROMIDE 0.02 % IN SOLN
0.5000 mg | Freq: Two times a day (BID) | RESPIRATORY_TRACT | Status: DC
  Administered 2020-09-12: 0.5 mg via RESPIRATORY_TRACT
  Filled 2020-09-12 (×2): qty 2.5

## 2020-09-12 MED ORDER — LEVALBUTEROL HCL 0.63 MG/3ML IN NEBU
0.6300 mg | INHALATION_SOLUTION | Freq: Two times a day (BID) | RESPIRATORY_TRACT | Status: DC
  Administered 2020-09-12: 0.63 mg via RESPIRATORY_TRACT
  Filled 2020-09-12 (×2): qty 3

## 2020-09-12 MED ORDER — SODIUM CHLORIDE 0.9 % IV SOLN
1.0000 g | INTRAVENOUS | Status: DC
  Administered 2020-09-12: 1 g via INTRAVENOUS
  Filled 2020-09-12: qty 1

## 2020-09-12 MED ORDER — SODIUM CHLORIDE 0.9 % IV SOLN
6.2500 mg | Freq: Once | INTRAVENOUS | Status: AC
  Administered 2020-09-12: 6.25 mg via INTRAVENOUS
  Filled 2020-09-12 (×2): qty 0.25

## 2020-09-12 NOTE — Progress Notes (Signed)
NAME:  [name], MRN:  [id number], DOB:  1928/10/31, LOS: 1 ADMISSION DATE:  09/09/2020, CONSULTATION DATE:  09/11/2020 REFERRING MD:  Raiford Noble Latif CHIEF COMPLAINT:  Loculated R pleural effusion   History of Present Illness:  87 year old female, smoker, with PMHx significant for HTN, T2DM, CKD stage IIIa, Afib (s/p ablation, pacemaker placement) and COPD who presented to [facility] 4/25 for shoulder pain, weakness and SOB requiring 4L [place]. Found to have large R pleural effusion on CXR with concern for vascular congestion and ?pulmonary edema. Empirically treated for CAP (ceftriaxone, azithromycin), given WBC 39K on admission. S/p thoracentesis with IR 4/26.  PCCM consulted for assistance with management.  Pertinent Medical History:   Past Medical History:  Diagnosis Date  . Ambulates with cane    straight  . Anxiety   . Arthritis    lower back  . CKD (chronic kidney disease), stage III ([place])   . Closed fracture of left olecranon process   . COPD (chronic obstructive pulmonary disease) ([place])   . Depression   . Diabetes mellitus without complication ([place])    type 2  . Headache   . Hypertension   . Orthostatic hypotension   . Osteopenia   . Pacemaker   . 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 Permanent atrial fibrillation ([place])    a.  s/p AVN ablation 2010 with complete heart block s/p PPM (gen change 2015, pt preference for Coumadin).  . Protein calorie malnutrition ([place])   . S/P AV nodal ablation   . TIA (transient ischemic attack)    Significant Hospital Events: Including procedures, antibiotic start and stop dates in addition to other pertinent events    4/25 - Admitted via [facility] ED for SOB, shoulder pain; CXR demonstrating large R pleural effusion. Ceftriaxone/Azithromycin started for empiric CAP.  4/26 - IR thoracentesis with 75m fluid, 12K cells (88% N), exudative. PCCM consulted. Pigtail placed at bedside and initial  lytics  instilled. Abx broadened to cefepime/flagyl.  Interim History / Subjective:  Appears critically ill On high flow nasal cannula Poorly responsive Afebrile Chest tube has drained 1400 cc, sanguinous fluid   Objective   Blood pressure (!) 126/47, pulse 60, temperature 97.8 F (36.6 C), resp. rate 20, weight 49.3 kg, SpO2 91 %.    FiO2 (%):  [100 %] 100 %   Intake/Output Summary (Last 24 hours) at 09/12/2020 1016 Last data filed at 09/12/2020 0858 Gross per 24 hour  Intake 250 ml  Output 2200 ml  Net -1950 ml   Filed Weights   09/14/2020 2036  Weight: 49.3 kg   Physical Examination: General: Chronically ill-appearing elderly woman, weak and deconditioned, thin HEENT: [place]/AT, anicteric sclera, PERRL, dry mucous membranes Neuro: Responds to name but does not follow commands CV: RRR, no m/g/r. PULM:  Mild accessory muscle use, decreased breath sounds on right GI: Soft, nontender, nondistended. Normoactive bowel sounds. Extremities: No LE edema noted. Skin: Warm/dry, no rashes.  Labs/imaging that I have personally reviewed: (right click and "Reselect all SmartList Selections" daily)   Labs show persistent leukocytosis, worsening creatinine 1.7, mild hyperkalemia and hyponatremia Chest x-ray shows right pigtail in position but pleural effusion appears increased  Pleural fluid studies: Cell count 12,570 (88% N) Protein 3.8 LDH 1617 Gluc 42 PH, Fluid Culture >>   Noncon CT Chest 4/25 Moderate R pleural effusion (partially loculated) tracking into the fissures, significant debris retained secretions in the tracheobronchial tree with filling of bronchi R >  L, complete RML and near-complete opacification of RLL; complete filling of RLL bronchus, segmental bronchi, and some segmental branches, few prominent mediastinal LNs  Resolved Hospital Problem List    Assessment & Plan:  Acute hypoxic respiratory failure  Community-acquired pneumonia Right empyema History of COPD Active  smoker DNR  -Lytics seem to be working, she drained 1400 cc however leukocytosis persists and chest x-ray shows persistent large effusion. -She still appears tenuous likely due to sepsis from ongoing infection, she has developed acute kidney injury    Plan: -Second dose of tPA/dornase instilled into right pleural space today  -Would continue full medical care including antibiotics and oxygen and chest tube drainage and see if she will recover.  If worse, should transition to full comfort.  This was discussed with the patient's Sister Myra at the bedside on 4/26    Labs   CBC: Recent Labs  Lab 09/07/2020 1458 09/11/20 0345 09/12/20 0332  WBC 39.0* 42.7* 42.2*  NEUTROABS 35.4*  --  38.8*  HGB 13.8 12.7 13.6  HCT 42.2 39.1 43.4  MCV 95.5 95.1 98.6  PLT 203 194 [id number]   Basic Metabolic Panel: Recent Labs  Lab 08/29/2020 1458 09/11/20 0345 09/12/20 0332  NA 124* 125* 132*  K 5.4* 4.9 5.0  CL 87* 89* 91*  CO2 '26 24 27  '$ GLUCOSE 183* 173* 170*  BUN 29* 24* 38*  CREATININE 1.56* 1.35* 1.70*  CALCIUM 9.4 8.9 9.1  MG  --   --  1.7  PHOS  --   --  5.8*   GFR: Estimated Creatinine Clearance: 17.5 mL/min (A) (by C-G formula based on SCr of 1.7 mg/dL (H)). Recent Labs  Lab 09/09/2020 1458 09/11/20 0345 09/12/20 0332  WBC 39.0* 42.7* 42.2*  LATICACIDVEN 1.7  --   --    Liver Function Tests: Recent Labs  Lab 09/11/2020 1458 09/11/20 0345 09/12/20 0332  AST 27 15 14*  ALT '22 18 17  '$ ALKPHOS 104 102 110  BILITOT 1.0 0.6 0.8  PROT 6.8 5.8* 5.9*  ALBUMIN 3.1* 2.6* 2.5*   Recent Labs  Lab 08/31/2020 1458  LIPASE 20   No results for input(s): AMMONIA in the last 168 hours.  ABG:    Component Value Date/Time   TCO2 26 09/17/2014 2217    Coagulation Profile: Recent Labs  Lab 08/19/2020 1458 09/12/20 0332  INR 2.3* 1.2     Kara Mead MD. FCCP. [place] [facility] Pager : [phone]  If no response to pager , please call [phone] until 7 pm After 7:00  pm call [place]  [phone]   09/12/2020

## 2020-09-12 NOTE — Progress Notes (Signed)
PT Cancellation Note  Patient Details Name: [name] MRN: [id number] DOB: [birth year]   Cancelled Treatment:    Reason Eval/Treat Not Completed:  Per chart, plan is possibly for comfort care. Will hold PT at this time.    [facility]  Office: [phone] Pager: [phone]

## 2020-09-12 NOTE — Hospital Course (Addendum)
44yow PMH COPD, DM type 2, CKD stage IIIa, afib presented w/ shoulder pain left and SOB. Admitted for acute hypoxic resp failure, large right pleural effusion, WBC 39k. CT showed right effusion partially loculated w/ middle and lower lobe opacification combination of postobstructive and compressive atelectasis, concern for aspiration. Thoracentesis limited. Seen by pulmonology, dx w/ empyema, s/p pigtail catheter 4/26 at bedside w/ thrombolytic.  Empyema resolved however patient failed to improve clinically, remained encephalopathic and did not wake up.  Renal failure worsening, unable to eat or drink, unresponsive.  Not responding to treatment.  Sister did not want any further investigation.  In further discussion with sister/POA, decision made to proceed with full comfort care.  She understands death is expected in the next 24 hours.  A & P  Severe sepsis secondary to right empyema, aspiration pneumonia with associated right pleural effusion in long-time smoker w/ associated acute metabolic encephalopathy (baseline lived independently w/o assistance and no confusion). CT showed loculated effusion, concern for aspiration, RML and RLL disease, as well as patchy airspace disease LUL, lingula, LLL.  -- Empyema resolved with minimal chest tube output, discussed with pulmonology previously.  Remove chest tube today.  Given failure to improve clinically, stop antibiotics according to goals of care.  Acute kidney injury superimposed on CKD stable IIIa -- Renal function appears to have plateaued, urine output adequate but overall failing to improve.  No further treatment or evaluation based on goals of care.  Mild hyperkalemia -- Trivial.  No further evaluation or treatment based on goals of care.  Dysphagia unspecified -- Remain NPO, but should patient wake up, comfort feeds would be given.  Hyponatremia -- Resolved.  Permanent atrial fibrillation  --stable.    PMH tachybrady syndrome, CHB, s/p AV  junction ablation 2012, s/p [employer] pacemaker Essential HTN --stable.  Notify [employer].  Hypothyroidism  -- No treatment  DM type 2 -- Stop monitoring  Depression, husband recently died  Aortic atherosclerosis  --no tx indicated

## 2020-09-12 NOTE — Progress Notes (Signed)
SLP Cancellation Note  Patient Details Name: [name] MRN: [id number] DOB: 1928/12/15   Cancelled treatment:       Reason Eval/Treat Not Completed: Medical issues which prohibited therapy. Pt currently poorly responsive and requiring additional O2 support with NRB.  Placement of pigtail chest tube with intrapleural lytics was initiated yesterday. ST will follow for improvement in status.   Jennie Bolar B. Quentin Ore, [facility], [place] Speech Language Pathologist Office: [phone] Pager: [phone]  Shonna Chock 09/12/2020, 9:47 AM

## 2020-09-12 NOTE — Progress Notes (Addendum)
Pharmacy Antibiotic Note  [name] is a 87 y.o. female admitted on 09/08/2020 with respiratory failure.  Pharmacy has been consulted for vancomycin + cefepime dosing for PNA.  Antibiotics being broadened from ceftriaxone + azithromycin to vancomycin + cefepime.   Today, 09/12/20  SCr with significant increase to 1.7  MRSA PCR pending  All cultures negative so far  Plan:  Reduce cefepime dosing to 1 g iv q 24 hours  Metronidazole 500 mg IV q8h per provider  Vancomycin 1000 mg LD followed by 750 mg IV q48h  Will not adjust vancomycin dosing at this point with next dose due 4/28 PM, but will need to adjust or check level if plan to continue with worsening SCr  Goal vancomycin AUC 400-550. Check levels at steady state as needed  F/U MRSA PCR  Weight: 49.3 kg (108 lb 11 oz)  Temp (24hrs), Avg:97.7 F (36.5 C), Min:97.5 F (36.4 C), Max:98 F (36.7 C)  Recent Labs  Lab 09/01/2020 1458 09/11/20 0345 09/12/20 0332  WBC 39.0* 42.7* 42.2*  CREATININE 1.56* 1.35* 1.70*  LATICACIDVEN 1.7  --   --     Estimated Creatinine Clearance: 17.5 mL/min (A) (by C-G formula based on SCr of 1.7 mg/dL (H)).    Allergies  Allergen Reactions  . Eliquis [Apixaban] Other (See Comments)    Made pt have weak muscles  . Gabapentin Nausea Only and Other (See Comments)    SHAKES AND NAUSEA   . Rozerem [Ramelteon] Other (See Comments)    DIZZY AND HEAD THROBBING  . Tikosyn [Dofetilide] Itching and Swelling    Mouth swells, itching  . Tradjenta [Linagliptin] Diarrhea and Other (See Comments)    Joint Pain, Stomach issues  . Temazepam Other (See Comments)    INSOMNIA    Antimicrobials this admission: ceftriaxone 4/25 >> 4/26 azithromycin 4/25 >> 4/26 Vancomycin 4/26 >> Cefepime 4/26 >> Metronidazole 4/26 >>  Dose adjustments this admission:  Microbiology results: 4/25 BCx: ngtd 4/25 UCx:  ngf 4/26 Pleural fluid ngtd 4/26 MRSA PCR: Ordered  Thank you for allowing pharmacy to  be a part of this patient's care.  Napoleon Form, PharmD 09/12/2020 9:14 AM

## 2020-09-12 NOTE — Progress Notes (Signed)
At nurse's request, Chaplain visited w/ pt's sister and two friends in the room who have made the decision to move pt to comfort care.  Sister explained pt's husband had died unexpectedly just 10 days ago which she thought just broke her heart and she has given up trying to live any more.  Pt and husband married for 50+ years; no children; no reason to live any longer.  Sister is ok w/ pt being moved to comfort care.  Sister has good network of support.  Her pastor has visited.  Chaplain prayed at bedside w/ pt, sister and two friends.  [place]

## 2020-09-12 NOTE — Progress Notes (Addendum)
   09/12/20 0142  Provider Notification  Provider Name/Title Hal Hope, MD  Date Provider Notified 09/12/20  Time Provider Notified [phone]  Notification Type Page  Notification Reason Change in status (Pt states she is not feeling well BP 124/34)  Provider response Other (Comment) (awaitng new orders, spoke with provider on the phone)  Date of Provider Response 09/12/20  Time of Provider Response 0145   Patient given Zofran at 2313, patient began to feel nauseous again around 0130 and stated she was not feeling well. Vitals Bp- 121/34 P- 60 O2- 91% on 15L HFNC Temp- 97.5 Axillary  RR- 20

## 2020-09-12 NOTE — Progress Notes (Signed)
Patient's 02 in mid 61's. Spoke with Ronalee Belts in Respiratory therapy, he suggested adding a non-rebreather. Added NRB patient's O2 is currently at 91%

## 2020-09-12 NOTE — Procedures (Signed)
Pleural Fibrinolytic Administration Procedure Note  [name]  [id number]  1929/03/06  Date:09/12/20  Time:9:28 AM   Provider Performing:Hyun Marsalis V. Jadore Veals   Procedure: Pleural Fibrinolysis Subsequent day [id number]  Indication(s) Fibrinolysis of complicated pleural effusion  Consent Risks of the procedure as well as the alternatives and risks of each were explained to the patient and/or caregiver.  Consent for the procedure was obtained.   Anesthesia None   Time Out Verified patient identification, verified procedure, site/side was marked, verified correct patient position, special equipment/implants available, medications/allergies/relevant history reviewed, required imaging and test results available.   Sterile Technique Hand hygiene, gloves   Procedure Description Existing pleural catheter was cleaned and accessed in sterile manner.  '10mg'$  of tPA in 30cc of saline and '5mg'$  of dornase in 30cc of sterile water were injected into pleural space using existing pleural catheter.  Catheter will be clamped for 1 hour and then placed back to water seal.   Complications/Tolerance None; patient tolerated the procedure well.   EBL None   Specimen(s) None  [name] V. Sherri Soho MD

## 2020-09-12 NOTE — Progress Notes (Signed)
PROGRESS NOTE  [name] [id number] DOB: Jul 06, 1928 DOA: 09/11/2020 PCP: Antony Contras, MD  Brief History   11yow PMH COPD, DM type 2, CKD stage IIIa, afib presented w/ shoulder pain left and SOB. Admitted for acute hypoxic resp failure, large right pleural effusion, WBC 39k. CT showed right effusion partially loculated w/ middle and lower lobe opacification combination of postobstructive and compressive atelectasis, concern for aspiration. Thoracentesis limited. Seen by pulmonology, dx w/ empyema, s/p pigtail catheter 4/26 at bedside w/ thrombolytic.  A & P  Severe sepsis secondary to right empyema, aspiration pneumonia with associated right pleural effusion in long-time smoker w/ associated acute metabolic encephalopathy (baseline lived independently w/o assistance and no confusion). CT showed loculated effusion, concern for aspiration, RML and RLL disease, as well as patchy airspace disease LUL, lingula, LLL.  -- Continue abx. Not a surgical candidate per pulmonology.  --CXR w/o sig change --s/p tPA day 2 --Remains critically ill.  May not survive this hospitalization.  Dysphagia unspecified --NPO  Hyponatremia --Mild.  Secondary to acute lung disease.  Improved appropriately.  CKD stable IIIa --creatinine worse today --IVF, recheck in AM  Permanent atrial fibrillation  --stable. Hold Xarelto  PMH tachybrady syndrome, CHB, s/p AV junction ablation 2012, s/p Medtronic pacemaker Essential HTN --stable  Hypothyroidism  --stable continue levothryoxine  DM type 2 --CBG stable --continue SSI  Depression, husband recently died  Aortic atherosclerosis  --no tx indicated  Disposition Plan:  Discussion: Patient appears critically ill, continue antibiotics, repeat thrombolytics today, discussed with pulmonology.  Continue current course to date.  Discussed in detail with sister and friends at bedside.  Very realistic.  If condition worsens, would proceed with comfort care  but for now continue current treatment.  Status is: Inpatient  Remains inpatient appropriate because:IV treatments appropriate due to intensity of illness or inability to take PO and Inpatient level of care appropriate due to severity of illness   Dispo: The patient is from: Home              Anticipated d/c is to: TBD              Patient currently is not medically stable to d/c.   Difficult to place patient No  DVT prophylaxis:    Code Status: DNR Level of care: Med-Surg Family Communication: sister  Murray Hodgkins, MD  [facility] Direct contact: see [email or web address] (further directions at bottom of note if needed) 7PM-7AM contact night coverage as at bottom of note 09/12/2020, 3:23 PM  LOS: 1 day   Significant Hospital Events   . 4/25 admit for acute hypoxic resp failure, pleural effusion   Consults:  . Pulmonology    Procedures:  . 4/26 Ultrasound-guided diagnostic and therapeutic right thoracentesis performed yielding 60 cc of turbid, yellow fluid. . 4/26 Pleural Catheter Insertion w/ Imaging Guidance, Pleural Fibrinolysis Initial day   Significant Diagnostic Tests:  . 4/26 Echo LVEF 60-65%   Micro Data:  .    Antimicrobials:  .   Interval History/Subjective  CC: f/u SBO  Hypoxia worse overnight, added NRB  Awakens briefly but can provide no history Sister at bedside, pt not able to converse, confused, was also confused yesterday Was independent at home prior to this illness. Husband died unexpectedly 20-Sep-2022.  Objective   Vitals:  Vitals:   09/12/20 0812 09/12/20 1317  BP:  (!) 110/51  Pulse:  79  Resp:  (!) 22  Temp:  97.7 F (36.5 C)  SpO2: 91% 94%  Exam:  Constitutional:   . Appears calm and comfortable, critically ill ENMT:  . grossly normal hearing  . Lips appear normal . Tongue dry Respiratory:  . CTA on left, right diminished breath sounds but no w/r/r . Respiratory effort mildly increased Cardiovascular:  . RRR, no  m/r/g . Telemetry paced . No LE extremity edema   Psychiatric:  . Mental status . Mood, affect not assessable, briefly opens eyes and states name   I have personally reviewed the following:   Today's Data  . Na 132 . Cr up to 1.7 . WB C 42 stable  Scheduled Meds: . alteplase (TPA) for intrapleural administration  10 mg Intrapleural Daily   And  . pulmozyme (DORNASE) for intrapleural administration  5 mg Intrapleural Daily  . Chlorhexidine Gluconate Cloth  6 each Topical Daily  . escitalopram  20 mg Oral Daily  . guaiFENesin  1,200 mg Oral BID  . insulin aspart  0-9 Units Subcutaneous TID WC  . ipratropium  0.5 mg Nebulization BID  . levalbuterol  0.63 mg Nebulization BID  . levothyroxine  50 mcg Oral QAC breakfast  . melatonin  5 mg Oral QHS  . mirtazapine  30 mg Oral QHS  . sodium chloride flush  10 mL Intracatheter Q8H  . sodium chloride flush  3 mL Intravenous Q12H   Continuous Infusions: . sodium chloride 250 mL (09/12/20 1025)  . ceFEPime (MAXIPIME) IV    . metronidazole 500 mg (09/12/20 1452)  . [START ON 09/13/2020] vancomycin      Principal Problem:   Empyema (HCC) Active Problems:   Permanent atrial fibrillation (HCC)   Pacemaker-Medtronic   Acute hypoxemic respiratory failure (HCC)   Pleural effusion on right   Severe sepsis (HCC)   Acute metabolic encephalopathy   Dysphagia   CKD (chronic kidney disease), stage III ([place])   LOS: 1 day   How to contact the [facility] Attending or Consulting provider 7A - 7P or covering provider during after hours [place], for this patient?  1. Check the care team in [facility] and look for a) attending/consulting TRH provider listed and b) the [facility] team listed 2. Log into [email or web address] and use [place]'s universal password to access. If you do not have the password, please contact the hospital operator. 3. Locate the [facility] provider you are looking for under [facility] and page to a number that you can be directly  reached. 4. If you still have difficulty reaching the provider, please page the [facility] (Director on Call) for the Hospitalists listed on amion for assistance.

## 2020-09-13 ENCOUNTER — Inpatient Hospital Stay (HOSPITAL_COMMUNITY): Payer: Medicare Other

## 2020-09-13 DIAGNOSIS — J9601 Acute respiratory failure with hypoxia: Secondary | ICD-10-CM | POA: Diagnosis not present

## 2020-09-13 DIAGNOSIS — G9341 Metabolic encephalopathy: Secondary | ICD-10-CM | POA: Diagnosis not present

## 2020-09-13 DIAGNOSIS — R1310 Dysphagia, unspecified: Secondary | ICD-10-CM | POA: Diagnosis not present

## 2020-09-13 DIAGNOSIS — J869 Pyothorax without fistula: Secondary | ICD-10-CM | POA: Diagnosis not present

## 2020-09-13 LAB — GLUCOSE, CAPILLARY
Glucose-Capillary: 179 mg/dL — ABNORMAL HIGH (ref 70–99)
Glucose-Capillary: 158 mg/dL — ABNORMAL HIGH (ref 70–99)
Glucose-Capillary: 146 mg/dL — ABNORMAL HIGH (ref 70–99)
Glucose-Capillary: 148 mg/dL — ABNORMAL HIGH (ref 70–99)

## 2020-09-13 LAB — BASIC METABOLIC PANEL
Anion gap: 11 (ref 5–15)
BUN: 85 mg/dL — ABNORMAL HIGH (ref 8–23)
CO2: 27 mmol/L (ref 22–32)
Calcium: 8.6 mg/dL — ABNORMAL LOW (ref 8.9–10.3)
Chloride: 99 mmol/L (ref 98–111)
Creatinine, Ser: 2.83 mg/dL — ABNORMAL HIGH (ref 0.44–1.00)
GFR, Estimated: 15 mL/min — ABNORMAL LOW (ref >60)
Glucose, Bld: 147 mg/dL — ABNORMAL HIGH (ref 70–99)
Potassium: 5 mmol/L (ref 3.5–5.1)
Sodium: 137 mmol/L (ref 135–145)

## 2020-09-13 LAB — MAGNESIUM: Magnesium: 2.3 mg/dL (ref 1.7–2.4)

## 2020-09-13 LAB — PH, BODY FLUID: pH, Body Fluid: 6.7

## 2020-09-13 LAB — PHOSPHORUS: Phosphorus: 5.8 mg/dL — ABNORMAL HIGH (ref 2.5–4.6)

## 2020-09-13 MED ORDER — SODIUM CHLORIDE 0.9 % IV SOLN
1.0000 g | Freq: Two times a day (BID) | INTRAVENOUS | Status: DC
  Administered 2020-09-13 – 2020-09-14 (×4): 1 g via INTRAVENOUS
  Filled 2020-09-13 (×5): qty 1

## 2020-09-13 MED ORDER — VANCOMYCIN HCL 500 MG/100ML IV SOLN
500.0000 mg | INTRAVENOUS | Status: DC
  Administered 2020-09-13: 500 mg via INTRAVENOUS
  Filled 2020-09-13: qty 100

## 2020-09-13 MED ORDER — MORPHINE SULFATE (PF) 2 MG/ML IV SOLN
1.0000 mg | INTRAVENOUS | Status: DC | PRN
  Administered 2020-09-13 – 2020-09-15 (×4): 1 mg via INTRAVENOUS
  Filled 2020-09-13 (×4): qty 1

## 2020-09-13 MED ORDER — METRONIDAZOLE 500 MG/100ML IV SOLN
500.0000 mg | Freq: Three times a day (TID) | INTRAVENOUS | Status: DC
  Administered 2020-09-13 – 2020-09-15 (×5): 500 mg via INTRAVENOUS
  Filled 2020-09-13 (×5): qty 100

## 2020-09-13 MED ORDER — SODIUM CHLORIDE 0.9 % IV BOLUS
500.0000 mL | Freq: Once | INTRAVENOUS | Status: AC
  Administered 2020-09-13: 500 mL via INTRAVENOUS

## 2020-09-13 MED ORDER — MORPHINE SULFATE (PF) 2 MG/ML IV SOLN
1.0000 mg | INTRAVENOUS | Status: AC | PRN
  Administered 2020-09-13 (×2): 1 mg via INTRAVENOUS
  Filled 2020-09-13 (×3): qty 1

## 2020-09-13 MED ORDER — SODIUM CHLORIDE 0.9 % IV SOLN: INTRAVENOUS | Status: DC

## 2020-09-13 NOTE — Progress Notes (Signed)
Physical Therapy Discharge Patient Details Name: [name] MRN: [id number] DOB: [birth year] Today's Date: 09/13/2020 Time:  -     Patient discharged from PT services secondary to medical decline - will need to re-order PT to resume therapy services. High Oxygen requirements, Chest tube.   GP     Claretha Cooper 09/13/2020, 6:52 AM Story Pager [phone] Office [phone]

## 2020-09-13 NOTE — Progress Notes (Signed)
PROGRESS NOTE  [name] [id number] DOB: 1928-12-11 DOA: 09/05/2020 PCP: Antony Contras, MD  Brief History   5yow PMH COPD, DM type 2, CKD stage IIIa, afib presented w/ shoulder pain left and SOB. Admitted for acute hypoxic resp failure, large right pleural effusion, WBC 39k. CT showed right effusion partially loculated w/ middle and lower lobe opacification combination of postobstructive and compressive atelectasis, concern for aspiration. Thoracentesis limited. Seen by pulmonology, dx w/ empyema, s/p pigtail catheter 4/26 at bedside w/ thrombolytic.  A & P  Severe sepsis secondary to right empyema, aspiration pneumonia with associated right pleural effusion in long-time smoker w/ associated acute metabolic encephalopathy (baseline lived independently w/o assistance and no confusion). CT showed loculated effusion, concern for aspiration, RML and RLL disease, as well as patchy airspace disease LUL, lingula, LLL.  -- Empyema appears to be resolved with resolution on chest x-ray status post significant drainage from the chest tube.  Not a surgical candidate per pulmonology.   -- Chest x-ray appears improved. -- However clinically patient not improved.  Does not follow commands does not open eyes, does not answer questions.  Dysphagia unspecified -- Remain NPO  Hyponatremia --Mild.  Secondary to acute lung disease.   -- Will recheck  CKD stable IIIa -- IV fluids.  Check creatinine.  Permanent atrial fibrillation  --stable.  Continue to hold Xarelto.  PMH tachybrady syndrome, CHB, s/p AV junction ablation 2012, s/p Medtronic pacemaker Essential HTN --stable  Hypothyroidism  --stable can resume levothryoxine when taking p.o.  DM type 2 --CBG remains stable --continue SSI  Depression, husband recently died  Aortic atherosclerosis  --no tx indicated  Disposition Plan:  Discussion: Prognosis remains poor, discussed with Dr. Elsworth Soho, discussed with sister and friend at bedside.   We will continue antibiotics today in hopes patient can recover, however her encephalopathy remains profound and she is not able to eat or drink.  If her condition fails to improve over the next 1 to 2 days would consider transition to comfort care and residential hospice.  Status is: Inpatient  Remains inpatient appropriate because:IV treatments appropriate due to intensity of illness or inability to take PO and Inpatient level of care appropriate due to severity of illness   Dispo: The patient is from: Home              Anticipated d/c is to: TBD              Patient currently is not medically stable to d/c.   Difficult to place patient No  DVT prophylaxis:    Code Status: DNR Level of care: Med-Surg Family Communication: sister  Murray Hodgkins, MD  [facility] Direct contact: see [email or web address] (further directions at bottom of note if needed) 7PM-7AM contact night coverage as at bottom of note 09/13/2020, 2:57 PM  LOS: 2 days   Significant Hospital Events   . 4/25 admit for acute hypoxic resp failure, pleural effusion   Consults:  . Pulmonology    Procedures:  . 4/26 Ultrasound-guided diagnostic and therapeutic right thoracentesis performed yielding 60 cc of turbid, yellow fluid. . 4/26 Pleural Catheter Insertion w/ Imaging Guidance, Pleural Fibrinolysis Initial day   Significant Diagnostic Tests:  . 4/26 Echo LVEF 60-65%   Micro Data:  . BC NGTD . UC NG . Pleural fluid NGTD   Antimicrobials:  . Cefepime 4/26 > . Metronidazole 4/26 > . Vancomycin 4/26 >  Interval History/Subjective  CC: f/u pneumonia  Remains noninteractive Sister at bedside Objective  Vitals:  Vitals:   09/12/20 2121 09/13/20 0411  BP: (!) 104/50 (!) 112/49  Pulse: 79 (!) 59  Resp: 16 16  Temp: 97.9 F (36.6 C) 97.6 F (36.4 C)  SpO2: 96% 95%    Exam:  Constitutional:   . Appears calm and comfortable ENMT:  . grossly normal hearing  Respiratory:  . Fair air movement on  the right, no frank wheezes, rales or rhonchi, clear to auscultation on the left . .  Mild increased respiratory effort. Cardiovascular:  . RRR, no m/r/g . No LE extremity edema   Abdomen:  . soft Skin:  . No rashes, lesions, ulcers Psychiatric:  . Mental status.  Cannot assess mood or affect.   I have personally reviewed the following:   Today's Data  . Labs pending  Scheduled Meds: . Chlorhexidine Gluconate Cloth  6 each Topical Daily  . insulin aspart  0-9 Units Subcutaneous TID WC  . levothyroxine  50 mcg Oral QAC breakfast  . melatonin  5 mg Oral QHS  . sodium chloride flush  10 mL Intracatheter Q8H  . sodium chloride flush  3 mL Intravenous Q12H   Continuous Infusions: . sodium chloride 250 mL (09/12/20 1025)  . sodium chloride    . ceFEPime (MAXIPIME) IV 1 g (09/13/20 1240)  . metronidazole    . vancomycin      Principal Problem:   Empyema (HCC) Active Problems:   Permanent atrial fibrillation (HCC)   Pacemaker-Medtronic   Acute hypoxemic respiratory failure (HCC)   Pleural effusion on right   Severe sepsis (HCC)   Acute metabolic encephalopathy   Dysphagia   CKD (chronic kidney disease), stage III ([place])   LOS: 2 days   How to contact the [facility] Attending or Consulting provider 7A - 7P or covering provider during after hours [place], for this patient?  1. Check the care team in [facility] and look for a) attending/consulting TRH provider listed and b) the [facility] team listed 2. Log into [email or web address] and use 's universal password to access. If you do not have the password, please contact the hospital operator. 3. Locate the [facility] Acute Care provider you are looking for under [facility] and page to a number that you can be directly reached. 4. If you still have difficulty reaching the provider, please page the [facility] (Director on Call) for the Hospitalists listed on amion for assistance.

## 2020-09-13 NOTE — Progress Notes (Signed)
09/12/20 2121  Assess: MEWS Score  Temp 97.9 F (36.6 C)  BP (!) 104/50  Pulse Rate 79  Resp 16  Level of Consciousness Responds to Pain  SpO2 96 %  O2 Device HFNC  O2 Flow Rate (L/min) 15 L/min  FiO2 (%) 100 %  Assess: MEWS Score  MEWS Temp 0  MEWS Systolic 0  MEWS Pulse 0  MEWS RR 0  MEWS LOC 2  MEWS Score 2  MEWS Score Color Yellow  Assess: if the MEWS score is Yellow or Red  Were vital signs taken at a resting state? Yes  Focused Assessment No change from prior assessment  Early Detection of Sepsis Score *See Row Information* Low  MEWS guidelines implemented *See Row Information* Yes  Treat  MEWS Interventions Administered scheduled meds/treatments  Pain Scale Faces  Pain Score 0  Faces Pain Scale 0  Patients response to intervention Unchanged  Take Vital Signs  Increase Vital Sign Frequency  Yellow: Q 2hr X 2 then Q 4hr X 2, if remains yellow, continue Q 4hrs  Escalate  MEWS: Escalate Yellow: discuss with charge nurse/RN and consider discussing with provider and RRT  Notify: Charge Nurse/RN  Name of Charge Nurse/RN Notified Pam, RN    09/12/20 2121  Assess: MEWS Score  Temp 97.9 F (36.6 C)  BP (!) 104/50  Pulse Rate 79  Resp 16  Level of Consciousness Responds to Pain  SpO2 96 %  O2 Device HFNC  O2 Flow Rate (L/min) 15 L/min  FiO2 (%) 100 %  Assess: MEWS Score  MEWS Temp 0  MEWS Systolic 0  MEWS Pulse 0  MEWS RR 0  MEWS LOC 2  MEWS Score 2  MEWS Score Color Yellow  Assess: if the MEWS score is Yellow or Red  Were vital signs taken at a resting state? Yes  Focused Assessment No change from prior assessment  Early Detection of Sepsis Score *See Row Information* Low  MEWS guidelines implemented *See Row Information* Yes  Treat  MEWS Interventions Administered scheduled meds/treatments  Pain Scale Faces  Pain Score 0  Faces Pain Scale 0  Patients response to intervention Unchanged  Take Vital Signs  Increase Vital Sign Frequency  Yellow: Q  2hr X 2 then Q 4hr X 2, if remains yellow, continue Q 4hrs  Escalate  MEWS: Escalate Yellow: discuss with charge nurse/RN and consider discussing with provider and RRT  Notify: Charge Nurse/RN  Name of Charge Nurse/RN Notified Pam, RN    09/12/20 2121  Assess: MEWS Score  Temp 97.9 F (36.6 C)  BP (!) 104/50  Pulse Rate 79  Resp 16  Level of Consciousness Responds to Pain  SpO2 96 %  O2 Device HFNC  O2 Flow Rate (L/min) 15 L/min  FiO2 (%) 100 %  Assess: MEWS Score  MEWS Temp 0  MEWS Systolic 0  MEWS Pulse 0  MEWS RR 0  MEWS LOC 2  MEWS Score 2  MEWS Score Color Yellow  Assess: if the MEWS score is Yellow or Red  Were vital signs taken at a resting state? Yes  Focused Assessment No change from prior assessment  Early Detection of Sepsis Score *See Row Information* Low  MEWS guidelines implemented *See Row Information* Yes  Treat  MEWS Interventions Administered scheduled meds/treatments  Pain Scale Faces  Pain Score 0  Faces Pain Scale 0  Patients response to intervention Unchanged  Take Vital Signs  Increase Vital Sign Frequency  Yellow: Q 2hr X  2 then Q 4hr X 2, if remains yellow, continue Q 4hrs  Escalate  MEWS: Escalate Yellow: discuss with charge nurse/RN and consider discussing with provider and RRT  Notify: Charge Nurse/RN  Name of Charge Nurse/RN Notified Pam, RN

## 2020-09-13 NOTE — Progress Notes (Signed)
NAME:  [name], MRN:  [id number], DOB:  1929/04/25, LOS: 2 ADMISSION DATE:  08/22/2020, CONSULTATION DATE:  09/11/2020 REFERRING MD:  Raiford Noble Latif CHIEF COMPLAINT:  Loculated R pleural effusion   History of Present Illness:  87 year old female, smoker, with PMHx significant for HTN, T2DM, CKD stage IIIa, Afib (s/p ablation, pacemaker placement) and COPD who presented to [facility] 4/25 for shoulder pain, weakness and SOB requiring 4L [place]. Found to have large R pleural effusion on CXR with concern for vascular congestion and ?pulmonary edema. Empirically treated for CAP (ceftriaxone, azithromycin), given WBC 39K on admission. S/p thoracentesis with IR 4/26.  PCCM consulted for assistance with management.  Pertinent Medical History:   Past Medical History:  Diagnosis Date  . Ambulates with cane    straight  . Anxiety   . Arthritis    lower back  . CKD (chronic kidney disease), stage III ([place])   . Closed fracture of left olecranon process   . COPD (chronic obstructive pulmonary disease) ([place])   . Depression   . Diabetes mellitus without complication ([place])    type 2  . Headache   . Hypertension   . Orthostatic hypotension   . Osteopenia   . Pacemaker   . 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 Permanent atrial fibrillation ([place])    a.  s/p AVN ablation 2010 with complete heart block s/p PPM (gen change 2015, pt preference for Coumadin).  . Protein calorie malnutrition ([place])   . S/P AV nodal ablation   . TIA (transient ischemic attack)    Significant Hospital Events: Including procedures, antibiotic start and stop dates in addition to other pertinent events    4/25 - Admitted via [facility] ED for SOB, shoulder pain; CXR demonstrating large R pleural effusion. Ceftriaxone/Azithromycin started for empiric CAP.  4/26 - IR thoracentesis with 19m fluid, 12K cells (88% N), exudative. PCCM consulted. Pigtail placed at bedside and initial  lytics  instilled. Abx broadened to cefepime/flagyl.  4/27 2nd dose of Lytics  Interim History / Subjective:   Chest tube has drained another 600 cc of sanguinous fluid Still appears critically ill, saturation 96% on high flow nasal cannula. Hemodynamically stable Afebrile Received 1 dose of morphine overnight  Objective   Blood pressure (!) 112/49, pulse (!) 59, temperature 97.6 F (36.4 C), temperature source Oral, resp. rate 16, weight 49.3 kg, SpO2 95 %.    FiO2 (%):  [100 %] 100 %   Intake/Output Summary (Last 24 hours) at 09/13/2020 1022 Last data filed at 09/13/2020 0911 Gross per 24 hour  Intake 420 ml  Output 650 ml  Net -230 ml   Filed Weights   08/18/2020 2036  Weight: 49.3 kg   Physical Examination: General: Chronically ill-appearing elderly woman, thin and frail HEENT: [place]/AT, anicteric sclera, PERRL, dry mucous membranes Neuro: Responds to name, sticks tongue out but does not follow other commands CV: RRR, no m/g/r. PULM:  Decreased breath sounds on right, no accessory muscle use , mild intermittent air leak on pigtail GI: Soft, nontender, nondistended. Normoactive bowel sounds. Extremities: No LE edema noted. Skin: Warm/dry, no rashes.  Labs/imaging that I have personally reviewed: (right click and "Reselect all SmartList Selections" daily)   Labs show persistent leukocytosis, worsening creatinine 1.7, mild hyperkalemia and hyponatremia Chest x-ray 4/28 independently reviewed shows resolution of effusion  Pleural fluid studies: Cell count 12,570 (88% N) Protein 3.8 LDH 1617 Gluc 42 PH, Fluid Culture >>  Noncon CT Chest 4/25 Moderate R pleural effusion (partially loculated) tracking into the fissures, significant debris retained secretions in the tracheobronchial tree with filling of bronchi R > L, complete RML and near-complete opacification of RLL; complete filling of RLL bronchus, segmental bronchi, and some segmental branches, few prominent mediastinal  LNs  Resolved Hospital Problem List    Assessment & Plan:  Acute hypoxic respiratory failure  Community-acquired pneumonia Right empyema History of COPD Active smoker DNR  -Lytics seem to have worked, effusion is resolved -She still appears tenuous likely due to hypoxia/sepsis from ongoing infection & acute kidney injury    Plan: -Does not need more lytics, I have asked RN to change pleural VAC, pigtail can be discontinued once drainage stops -Would still advocate to continue medical care including antibiotics and oxygen, however certainly okay to use morphine for comfort  Discussed with Sister Myra at bedside who is okay with this plan    Labs   CBC: Recent Labs  Lab 08/28/2020 1458 09/11/20 0345 09/12/20 0332  WBC 39.0* 42.7* 42.2*  NEUTROABS 35.4*  --  38.8*  HGB 13.8 12.7 13.6  HCT 42.2 39.1 43.4  MCV 95.5 95.1 98.6  PLT 203 194 [id number]   Basic Metabolic Panel: Recent Labs  Lab 08/19/2020 1458 09/11/20 0345 09/12/20 0332  NA 124* 125* 132*  K 5.4* 4.9 5.0  CL 87* 89* 91*  CO2 '26 24 27  '$ GLUCOSE 183* 173* 170*  BUN 29* 24* 38*  CREATININE 1.56* 1.35* 1.70*  CALCIUM 9.4 8.9 9.1  MG  --   --  1.7  PHOS  --   --  5.8*   GFR: Estimated Creatinine Clearance: 17.5 mL/min (A) (by C-G formula based on SCr of 1.7 mg/dL (H)). Recent Labs  Lab 09/14/2020 1458 09/11/20 0345 09/12/20 0332  WBC 39.0* 42.7* 42.2*  LATICACIDVEN 1.7  --   --    Liver Function Tests: Recent Labs  Lab 08/24/2020 1458 09/11/20 0345 09/12/20 0332  AST 27 15 14*  ALT '22 18 17  '$ ALKPHOS 104 102 110  BILITOT 1.0 0.6 0.8  PROT 6.8 5.8* 5.9*  ALBUMIN 3.1* 2.6* 2.5*   Recent Labs  Lab 08/29/2020 1458  LIPASE 20   No results for input(s): AMMONIA in the last 168 hours.  ABG:    Component Value Date/Time   TCO2 26 09/17/2014 2217    Coagulation Profile: Recent Labs  Lab 08/31/2020 1458 09/12/20 0332  INR 2.3* 1.2     Kara Mead MD. FCCP. [place] [facility] Pager : [phone]  If no response to pager , please call [phone] until 7 pm After 7:00 pm call Elink  [phone]   09/13/2020

## 2020-09-13 NOTE — Progress Notes (Signed)
Pharmacy Antibiotic Note  [name] is a 87 y.o. female admitted on 09/07/2020 with respiratory failure.  Pharmacy has been consulted for vancomycin + cefepime dosing for PNA.  Today, 09/13/20  SCr = 1.7, CrCl ~18 mL/min  WBC 42  Afebrile  MRSA PCR negative  Plan:  Cefepime 1 g IV q12h  Vancomycin 500 mg IV q36h  Metronidazole 500 mg IV q8h  Goal vancomycin AUC 400-550. Check levels at steady state as needed  Check SCr with AM labs tomorrow  Weight: 49.3 kg (108 lb 11 oz)  Temp (24hrs), Avg:98 F (36.7 C), Min:97.6 F (36.4 C), Max:98.7 F (37.1 C)  Recent Labs  Lab 09/05/2020 1458 09/11/20 0345 09/12/20 0332  WBC 39.0* 42.7* 42.2*  CREATININE 1.56* 1.35* 1.70*  LATICACIDVEN 1.7  --   --     Estimated Creatinine Clearance: 17.5 mL/min (A) (by C-G formula based on SCr of 1.7 mg/dL (H)).    Allergies  Allergen Reactions  . Eliquis [Apixaban] Other (See Comments)    Made pt have weak muscles  . Gabapentin Nausea Only and Other (See Comments)    SHAKES AND NAUSEA   . Rozerem [Ramelteon] Other (See Comments)    DIZZY AND HEAD THROBBING  . Tikosyn [Dofetilide] Itching and Swelling    Mouth swells, itching  . Tradjenta [Linagliptin] Diarrhea and Other (See Comments)    Joint Pain, Stomach issues  . Temazepam Other (See Comments)    INSOMNIA    Antimicrobials this admission: ceftriaxone 4/25 >> 4/26 azithromycin 4/25 >> 4/26 Vancomycin 4/26 >> Cefepime 4/26 >> Metronidazole 4/26 >>  Dose adjustments this admission:  Microbiology results: 4/25 BCx: ngtd 4/25 UCx:  ngf 4/26 Pleural fluid ngtd 4/26 MRSA PCR: Ordered  Thank you for allowing pharmacy to be a part of this patient's care.  Lenis Noon, PharmD 09/13/2020 12:18 PM

## 2020-09-13 NOTE — Progress Notes (Signed)
SLP Cancellation Note  Patient Details Name: [name] MRN: [id number] DOB: [birth year]   Cancelled treatment:       Reason Eval/Treat Not Completed: Medical issues which prohibited therapy  RN reports pt continues to be poorly responsive, receiving morphine. SLP will sign off at this time. Please reconsult if/when pt becomes appropriate for re-evaluation. Pt's sister informed, and in agreement. RN, MD also informed.  Shayma Pfefferle B. Quentin Ore, [facility], [place] Speech Language Pathologist Office: [phone] Pager: [phone]  Shonna Chock 09/13/2020, 10:10 AM

## 2020-09-14 DIAGNOSIS — J869 Pyothorax without fistula: Secondary | ICD-10-CM | POA: Diagnosis not present

## 2020-09-14 DIAGNOSIS — G9341 Metabolic encephalopathy: Secondary | ICD-10-CM | POA: Diagnosis not present

## 2020-09-14 DIAGNOSIS — J9601 Acute respiratory failure with hypoxia: Secondary | ICD-10-CM | POA: Diagnosis not present

## 2020-09-14 DIAGNOSIS — N1831 Chronic kidney disease, stage 3a: Secondary | ICD-10-CM | POA: Diagnosis not present

## 2020-09-14 DIAGNOSIS — N179 Acute kidney failure, unspecified: Secondary | ICD-10-CM

## 2020-09-14 LAB — CBC
HCT: 39.4 % (ref 36.0–46.0)
Hemoglobin: 12.3 g/dL (ref 12.0–15.0)
MCH: 31.1 pg (ref 26.0–34.0)
MCHC: 31.2 g/dL (ref 30.0–36.0)
MCV: 99.7 fL (ref 80.0–100.0)
Platelets: 221 10*3/uL (ref 150–400)
RBC: 3.95 MIL/uL (ref 3.87–5.11)
RDW: 14.8 % (ref 11.5–15.5)
WBC: 34.9 10*3/uL — ABNORMAL HIGH (ref 4.0–10.5)
nRBC: 0 % (ref 0.0–0.2)

## 2020-09-14 LAB — PHOSPHORUS: Phosphorus: 6.1 mg/dL — ABNORMAL HIGH (ref 2.5–4.6)

## 2020-09-14 LAB — BASIC METABOLIC PANEL
Anion gap: 13 (ref 5–15)
BUN: 88 mg/dL — ABNORMAL HIGH (ref 8–23)
CO2: 20 mmol/L — ABNORMAL LOW (ref 22–32)
Calcium: 8.6 mg/dL — ABNORMAL LOW (ref 8.9–10.3)
Chloride: 106 mmol/L (ref 98–111)
Creatinine, Ser: 2.75 mg/dL — ABNORMAL HIGH (ref 0.44–1.00)
GFR, Estimated: 16 mL/min — ABNORMAL LOW (ref >60)
Glucose, Bld: 159 mg/dL — ABNORMAL HIGH (ref 70–99)
Potassium: 5.4 mmol/L — ABNORMAL HIGH (ref 3.5–5.1)
Sodium: 139 mmol/L (ref 135–145)

## 2020-09-14 LAB — GLUCOSE, CAPILLARY
Glucose-Capillary: 169 mg/dL — ABNORMAL HIGH (ref 70–99)
Glucose-Capillary: 164 mg/dL — ABNORMAL HIGH (ref 70–99)
Glucose-Capillary: 129 mg/dL — ABNORMAL HIGH (ref 70–99)
Glucose-Capillary: 104 mg/dL — ABNORMAL HIGH (ref 70–99)

## 2020-09-14 LAB — MAGNESIUM: Magnesium: 2.3 mg/dL (ref 1.7–2.4)

## 2020-09-14 MED ORDER — LEVOTHYROXINE SODIUM 100 MCG/5ML IV SOLN: 25.0000 ug | Freq: Every day | INTRAVENOUS | Status: DC

## 2020-09-14 MED ORDER — VANCOMYCIN VARIABLE DOSE PER UNSTABLE RENAL FUNCTION (PHARMACIST DOSING): Status: DC

## 2020-09-14 NOTE — Progress Notes (Signed)
NAME:  [name], MRN:  [id number], DOB:  July 04, 1928, LOS: 3 ADMISSION DATE:  08/28/2020, CONSULTATION DATE:  09/11/2020 REFERRING MD:  Raiford Noble Latif CHIEF COMPLAINT:  Loculated R pleural effusion   History of Present Illness:  87 year old female, smoker, with PMHx significant for HTN, T2DM, CKD stage IIIa, Afib (s/p ablation, pacemaker placement) and COPD who presented to [facility] 4/25 for shoulder pain, weakness and SOB requiring 4L [place]. Found to have large R pleural effusion on CXR with concern for vascular congestion and ?pulmonary edema. Empirically treated for CAP (ceftriaxone, azithromycin), given WBC 39K on admission. S/p thoracentesis with IR 4/26.  PCCM consulted for assistance with management.  Pertinent Medical History:   Past Medical History:  Diagnosis Date  . Ambulates with cane    straight  . Anxiety   . Arthritis    lower back  . CKD (chronic kidney disease), stage III ([place])   . Closed fracture of left olecranon process   . COPD (chronic obstructive pulmonary disease) ([place])   . Depression   . Diabetes mellitus without complication ([place])    type 2  . Headache   . Hypertension   . Orthostatic hypotension   . Osteopenia   . Pacemaker   . 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 Permanent atrial fibrillation ([place])    a.  s/p AVN ablation 2010 with complete heart block s/p PPM (gen change 2015, pt preference for Coumadin).  . Protein calorie malnutrition ([place])   . S/P AV nodal ablation   . TIA (transient ischemic attack)    Significant Hospital Events: Including procedures, antibiotic start and stop dates in addition to other pertinent events    4/25 - Admitted via [facility] ED for SOB, shoulder pain; CXR demonstrating large R pleural effusion. Ceftriaxone/Azithromycin started for empiric CAP.  4/26 - IR thoracentesis with 49m fluid, 12K cells (88% N), exudative. PCCM consulted. Pigtail placed at bedside and initial  lytics  instilled. Abx broadened to cefepime/flagyl.  4/27 2nd dose of Lytics, 1400 cc drainage  4/28 600 cc drainage last 24 hours  Interim History / Subjective:   680 cc drainage recorded. Sanguinous fluid and chest tube. She remains unresponsive Saturation 90% on high flow 15 L. Afebrile  Objective   Blood pressure (!) 109/51, pulse (!) 59, temperature 98.3 F (36.8 C), temperature source Oral, resp. rate 14, weight 49.3 kg, SpO2 92 %.        Intake/Output Summary (Last 24 hours) at 09/14/2020 0912 Last data filed at 09/14/2020 0636 Gross per 24 hour  Intake 270.78 ml  Output 480 ml  Net -209.22 ml   Filed Weights   09/11/2020 2036  Weight: 49.3 kg   Physical Examination: General: Chronically ill-appearing elderly woman, thin and frail HEENT: [place]/AT, anicteric sclera, PERRL, dry mucous membranes Neuro: Does not follow commands CV: RRR, no m/g/r. PULM:  No air leak on pigtail, no accessory muscle use, decreased breath sounds on right GI: Soft, nontender, nondistended. Normoactive bowel sounds. Extremities: No LE edema noted. Skin: Warm/dry, no rashes.  Labs/imaging that I have personally reviewed: (right click and "Reselect all SmartList Selections" daily)   Labs show slight decrease in leukocytosis, mild hyperkalemia , creatinine has plateaued Chest x-ray 4/28 independently reviewed shows resolution of effusion  Pleural fluid studies: Cell count 12,570 (88% N) Protein 3.8 LDH 1617 Gluc 42  Fluid Culture >>ng   Noncon CT Chest 4/25 Moderate R pleural effusion (partially loculated) tracking into the  fissures, significant debris retained secretions in the tracheobronchial tree with filling of bronchi R > L, complete RML and near-complete opacification of RLL; complete filling of RLL bronchus, segmental bronchi, and some segmental branches, few prominent mediastinal LNs  Resolved Hospital Problem List    Assessment & Plan:  Acute hypoxic respiratory failure   Community-acquired pneumonia Right empyema History of COPD Active smoker DNR  -Lytics seem to have worked, effusion is resolved -She still appears tenuous likely due to hypoxia/sepsis from ongoing infection & acute kidney injury    Plan: -Discontinue pigtail chest tube -Okay to use morphine as needed discomfort, DNR noted I am not as optimistic about a good outcome here anymore clearly comfort should be primary but may still be reasonable to continue antibiotics and oxygen over the weekend and see if she rallies, more likely would plan for hospice early next week Discussed with Sister Myra at bedside   PCCM will be available as needed    Labs   CBC: Recent Labs  Lab 08/30/2020 1458 09/11/20 0345 09/12/20 0332 09/14/20 0342  WBC 39.0* 42.7* 42.2* 34.9*  NEUTROABS 35.4*  --  38.8*  --   HGB 13.8 12.7 13.6 12.3  HCT 42.2 39.1 43.4 39.4  MCV 95.5 95.1 98.6 99.7  PLT 203 194 236 [id number]   Basic Metabolic Panel: Recent Labs  Lab 08/19/2020 1458 09/11/20 0345 09/12/20 0332 09/13/20 1553 09/14/20 0342  NA 124* 125* 132* 137 139  K 5.4* 4.9 5.0 5.0 5.4*  CL 87* 89* 91* 99 106  CO2 '26 24 27 27 '$ 20*  GLUCOSE 183* 173* 170* 147* 159*  BUN 29* 24* 38* 85* 88*  CREATININE 1.56* 1.35* 1.70* 2.83* 2.75*  CALCIUM 9.4 8.9 9.1 8.6* 8.6*  MG  --   --  1.7 2.3 2.3  PHOS  --   --  5.8* 5.8* 6.1*   GFR: Estimated Creatinine Clearance: 10.8 mL/min (A) (by C-G formula based on SCr of 2.75 mg/dL (H)). Recent Labs  Lab 09/06/2020 1458 09/11/20 0345 09/12/20 0332 09/14/20 0342  WBC 39.0* 42.7* 42.2* 34.9*  LATICACIDVEN 1.7  --   --   --    Liver Function Tests: Recent Labs  Lab 08/20/2020 1458 09/11/20 0345 09/12/20 0332  AST 27 15 14*  ALT '22 18 17  '$ ALKPHOS 104 102 110  BILITOT 1.0 0.6 0.8  PROT 6.8 5.8* 5.9*  ALBUMIN 3.1* 2.6* 2.5*   Recent Labs  Lab 08/25/2020 1458  LIPASE 20   No results for input(s): AMMONIA in the last 168 hours.  ABG:    Component Value Date/Time    TCO2 26 09/17/2014 2217    Coagulation Profile: Recent Labs  Lab 08/20/2020 1458 09/12/20 0332  INR 2.3* 1.2     Kara Mead MD. FCCP. [place] [facility] Pager : [phone]  If no response to pager , please call [phone] until 7 pm After 7:00 pm call Elink  [phone]   09/14/2020

## 2020-09-14 NOTE — Progress Notes (Signed)
PROGRESS NOTE  [name] [id number] DOB: September 02, 1928 DOA: 09/08/2020 PCP: Antony Contras, MD  Brief History   6yow PMH COPD, DM type 2, CKD stage IIIa, afib presented w/ shoulder pain left and SOB. Admitted for acute hypoxic resp failure, large right pleural effusion, WBC 39k. CT showed right effusion partially loculated w/ middle and lower lobe opacification combination of postobstructive and compressive atelectasis, concern for aspiration. Thoracentesis limited. Seen by pulmonology, dx w/ empyema, s/p pigtail catheter 4/26 at bedside w/ thrombolytic.  Empyema appears resolved and pigtail catheter removed.  However clinically patient failing to improve.  May not recover.  A & P  Severe sepsis secondary to right empyema, aspiration pneumonia with associated right pleural effusion in long-time smoker w/ associated acute metabolic encephalopathy (baseline lived independently w/o assistance and no confusion). CT showed loculated effusion, concern for aspiration, RML and RLL disease, as well as patchy airspace disease LUL, lingula, LLL.  -- Empyema appears resolved.  Remove chest tube today as per pulmonology. -- Continue empiric antibiotics, WBC trending down, remains afebrile. --Remains very poorly responsive.  Discussed in detail with sister at bedside.  Consideration given to the possibility of stroke prior to admission as she reports some facial droop perhaps in the emergency department.  We discussed risk/benefit, offered MRI or CT.  She declined to pursue work-up at this point, do not feel that it would change management at this point as treatment continues to be conservative.  Mild hyperkalemia --Fluids.  Check in a.m.  Dysphagia unspecified -- Remain NPO, nutrition will be an issue if patient does not recover soon.  Hyponatremia -- Resolved.  Acute kidney injury superimposed on CKD stable IIIa -- Renal function appears to be plateauing, hopefully will see improvement tomorrow.   Urine output improving today 1000 thus far.  Permanent atrial fibrillation  --stable.  Will continue to hold Xarelto.  PMH tachybrady syndrome, CHB, s/p AV junction ablation 2012, s/p Medtronic pacemaker Essential HTN --stable  Hypothyroidism  --stable can resume levothryoxine when taking p.o.  DM type 2 --CBG stable --continue SSI  Depression, husband recently died  Aortic atherosclerosis  --no tx indicated  Disposition Plan:  Discussion: Prognosis remains poor.  WBC down and renal function appears to have plateaued, remains afebrile and oxygen requirement stable at 15 L, however mentally have not seen any improvement in encephalopathy.  Very poorly responsive.  While stroke or other insult would be a consideration, does point family does not feel imaging would be of benefit.  We will continue current treatment and reevaluate tomorrow.  If condition fails to improve over the next 48 hours, will likely recommend transition to comfort care.  Status is: Inpatient  Remains inpatient appropriate because:IV treatments appropriate due to intensity of illness or inability to take PO and Inpatient level of care appropriate due to severity of illness   Dispo: The patient is from: Home              Anticipated d/c is to: TBD              Patient currently is not medically stable to d/c.   Difficult to place patient No  DVT prophylaxis:    Code Status: DNR Level of care: Med-Surg Family Communication: sister  Murray Hodgkins, MD  [facility] Direct contact: see [email or web address] (further directions at bottom of note if needed) 7PM-7AM contact night coverage as at bottom of note 09/14/2020, 3:18 PM  LOS: 3 days   Significant Hospital Events   . 4/25  admit for acute hypoxic resp failure, pleural effusion   Consults:  . Pulmonology    Procedures:  . 4/26 Ultrasound-guided diagnostic and therapeutic right thoracentesis performed yielding 60 cc of turbid, yellow fluid. . 4/26  Pleural Catheter Insertion w/ Imaging Guidance, Pleural Fibrinolysis Initial day   Significant Diagnostic Tests:  . 4/26 Echo LVEF 60-65%   Micro Data:  . BC NGTD . UC NG . Pleural fluid NGTD   Antimicrobials:  . Cefepime 4/26 > . Metronidazole 4/26 > . Vancomycin 4/26 >  Interval History/Subjective  CC: f/u pneumonia  Does not respond verbally per family Restless night  Objective   Vitals:  Vitals:   09/14/20 0621 09/14/20 1117  BP: (!) 109/51 (!) 113/44  Pulse: (!) 59 79  Resp: 14 18  Temp: 98.3 F (36.8 C) 98.1 F (36.7 C)  SpO2: 92% 98%    Exam: Constitutional:   . Appears calm and comfortable Respiratory:  . CTA bilaterally, no w/r/r.  . Respiratory effort normal Cardiovascular:  . RRR, no m/r/g . No LE extremity edema   Abdomen:  . soft Psychiatric:  . Mental status . Mood, affect cannot assess  I have personally reviewed the following:   Today's Data  . CBG stable . K+ 5.4 . Creatinine stable 2.75 . WBC down to 34.9  Scheduled Meds: . Chlorhexidine Gluconate Cloth  6 each Topical Daily  . insulin aspart  0-9 Units Subcutaneous TID WC  . [START ON 09/17/2020] levothyroxine  25 mcg Intravenous Daily  . melatonin  5 mg Oral QHS  . sodium chloride flush  10 mL Intracatheter Q8H  . sodium chloride flush  3 mL Intravenous Q12H  . vancomycin variable dose per unstable renal function (pharmacist dosing)   Does not apply See admin instructions   Continuous Infusions: . sodium chloride 250 mL (09/12/20 1025)  . sodium chloride 150 mL/hr at 09/14/20 0214  . ceFEPime (MAXIPIME) IV 1 g (09/14/20 1214)  . metronidazole 500 mg (09/14/20 0636)    Principal Problem:   Empyema (HCC) Active Problems:   Permanent atrial fibrillation (HCC)   Pacemaker-Medtronic   Acute hypoxemic respiratory failure (HCC)   Pleural effusion on right   Severe sepsis (HCC)   Acute metabolic encephalopathy   Dysphagia   CKD (chronic kidney disease), stage III ([place])    LOS: 3 days   How to contact the [facility] Attending or Consulting provider [place] or covering provider during after hours [place], for this patient?  1. Check the care team in [facility] and look for a) attending/consulting [facility] provider listed and b) the [facility] team listed 2. Log into [email or web address] and use [place]'s universal password to access. If you do not have the password, please contact the hospital operator. 3. Locate the [facility] provider you are looking for under [facility] and page to a number that you can be directly reached. 4. If you still have difficulty reaching the provider, please page the [facility] (Director on Call) for the Hospitalists listed on amion for assistance.

## 2020-09-14 NOTE — Plan of Care (Signed)

## 2020-09-14 NOTE — Progress Notes (Signed)
Pharmacy Antibiotic Note  [name] is a 87 y.o. female admitted on 08/29/2020 with sepsis 2/2 right empyema, aspiration PNA, and pleural effusion. Pharmacy has been consulted for vancomycin + cefepime dosing for PNA.  Today, 09/14/20  WBC remains elevated  SCr 2.75, CrCl ~11 mL/min. Worsening renal fxn  Afebrile  MRSA PCR negative  Plan:  Continue cefepime 1 g IV q12h  Given elevated SCr, will dose vancomycin off of random levels.   Last dose given 4/28 @ 1732, will plan to check 48 hour level  Goal VT 10-20 mcg/mL  Continue Metronidazole 500 mg IV q8h  Follow renal function closely  Weight: 49.3 kg (108 lb 11 oz)  Temp (24hrs), Avg:98.1 F (36.7 C), Min:97.6 F (36.4 C), Max:98.3 F (36.8 C)  Recent Labs  Lab 09/09/2020 1458 09/11/20 0345 09/12/20 0332 09/13/20 1553 09/14/20 0342  WBC 39.0* 42.7* 42.2*  --  34.9*  CREATININE 1.56* 1.35* 1.70* 2.83* 2.75*  LATICACIDVEN 1.7  --   --   --   --     Estimated Creatinine Clearance: 10.8 mL/min (A) (by C-G formula based on SCr of 2.75 mg/dL (H)).    Allergies  Allergen Reactions  . Eliquis [Apixaban] Other (See Comments)    Made pt have weak muscles  . Gabapentin Nausea Only and Other (See Comments)    SHAKES AND NAUSEA   . Rozerem [Ramelteon] Other (See Comments)    DIZZY AND HEAD THROBBING  . Tikosyn [Dofetilide] Itching and Swelling    Mouth swells, itching  . Tradjenta [Linagliptin] Diarrhea and Other (See Comments)    Joint Pain, Stomach issues  . Temazepam Other (See Comments)    INSOMNIA    Antimicrobials this admission: ceftriaxone 4/25 >> 4/26 azithromycin 4/25 >> 4/26 Vancomycin 4/26 >> Cefepime 4/26 >> Metronidazole 4/26 >>  Dose adjustments this admission:  Microbiology results: 4/25 BCx: ngtd 4/25 UCx:  ngF 4/26 Pleural fluid ngtd 4/26 MRSA PCR: Ordered  Thank you for allowing pharmacy to be a part of this patient's care.  Lenis Noon, PharmD 09/14/2020 12:59 PM

## 2020-09-14 NOTE — Plan of Care (Signed)
  Problem: Education: Goal: Knowledge of General Education information will improve Description: Including pain rating scale, medication(s)/side effects and non-pharmacologic comfort measures Outcome: Progressing   Problem: Health Behavior/Discharge Planning: Goal: Ability to manage health-related needs will improve Outcome: Progressing   Problem: Clinical Measurements: Goal: Ability to maintain clinical measurements within normal limits will improve Outcome: Progressing Goal: Will remain free from infection Outcome: Progressing Goal: Diagnostic test results will improve Outcome: Progressing Goal: Respiratory complications will improve Outcome: Progressing Goal: Cardiovascular complication will be avoided Outcome: Progressing   Problem: Activity: Goal: Risk for activity intolerance will decrease Outcome: Progressing   Problem: Nutrition: Goal: Adequate nutrition will be maintained Outcome: Progressing   Problem: Coping: Goal: Level of anxiety will decrease Outcome: Progressing   Problem: Safety: Goal: Ability to remain free from injury will improve Outcome: Progressing   

## 2020-09-14 NOTE — Progress Notes (Signed)
MD Sarajane Jews notified this afternoon that CT was not removed as of yet. Order is still in for IR to remove, just wanted MD to be aware. CT 174m out today. Family remained at bedside throughout day-very supportive. MD GSarajane Jewscame by a few times to speak with family & they were very appreciative.  Pt breathing this evening became slightly more shallow & RR 12-14 Still on 15L HFNC 90-94%

## 2020-09-15 DIAGNOSIS — G9341 Metabolic encephalopathy: Secondary | ICD-10-CM | POA: Diagnosis not present

## 2020-09-15 DIAGNOSIS — J9601 Acute respiratory failure with hypoxia: Secondary | ICD-10-CM | POA: Diagnosis not present

## 2020-09-15 DIAGNOSIS — Z950 Presence of cardiac pacemaker: Secondary | ICD-10-CM

## 2020-09-15 DIAGNOSIS — J869 Pyothorax without fistula: Secondary | ICD-10-CM | POA: Diagnosis not present

## 2020-09-15 DIAGNOSIS — N179 Acute kidney failure, unspecified: Secondary | ICD-10-CM | POA: Diagnosis not present

## 2020-09-15 LAB — CULTURE, BLOOD (ROUTINE X 2)
Culture: NO GROWTH
Culture: NO GROWTH
Special Requests: ADEQUATE
Special Requests: ADEQUATE

## 2020-09-15 LAB — BASIC METABOLIC PANEL
Anion gap: 9 (ref 5–15)
BUN: 102 mg/dL — ABNORMAL HIGH (ref 8–23)
CO2: 21 mmol/L — ABNORMAL LOW (ref 22–32)
Calcium: 8.6 mg/dL — ABNORMAL LOW (ref 8.9–10.3)
Chloride: 112 mmol/L — ABNORMAL HIGH (ref 98–111)
Creatinine, Ser: 2.76 mg/dL — ABNORMAL HIGH (ref 0.44–1.00)
GFR, Estimated: 16 mL/min — ABNORMAL LOW (ref >60)
Glucose, Bld: 137 mg/dL — ABNORMAL HIGH (ref 70–99)
Potassium: 5.5 mmol/L — ABNORMAL HIGH (ref 3.5–5.1)
Sodium: 142 mmol/L (ref 135–145)

## 2020-09-15 LAB — PHOSPHORUS: Phosphorus: 5.5 mg/dL — ABNORMAL HIGH (ref 2.5–4.6)

## 2020-09-15 LAB — BODY FLUID CULTURE W GRAM STAIN: Culture: NO GROWTH

## 2020-09-15 LAB — GLUCOSE, CAPILLARY: Glucose-Capillary: 139 mg/dL — ABNORMAL HIGH (ref 70–99)

## 2020-09-15 LAB — MAGNESIUM: Magnesium: 2.5 mg/dL — ABNORMAL HIGH (ref 1.7–2.4)

## 2020-09-15 MED ORDER — MORPHINE SULFATE (CONCENTRATE) 10 MG/0.5ML PO SOLN: 5.0000 mg | ORAL | Status: DC | PRN

## 2020-09-15 MED ORDER — GLYCOPYRROLATE 1 MG PO TABS
1.0000 mg | ORAL_TABLET | ORAL | Status: DC | PRN
  Filled 2020-09-15: qty 1

## 2020-09-15 MED ORDER — MORPHINE SULFATE (PF) 2 MG/ML IV SOLN
1.0000 mg | INTRAVENOUS | Status: DC | PRN
  Administered 2020-09-15: 1 mg via INTRAVENOUS
  Filled 2020-09-15: qty 1

## 2020-09-15 MED ORDER — GLYCOPYRROLATE 0.2 MG/ML IJ SOLN
0.2000 mg | INTRAMUSCULAR | Status: DC | PRN
  Administered 2020-09-15 (×2): 0.2 mg via INTRAVENOUS
  Filled 2020-09-15: qty 1

## 2020-09-15 MED ORDER — MORPHINE SULFATE (PF) 2 MG/ML IV SOLN
1.0000 mg | INTRAVENOUS | Status: DC | PRN
  Administered 2020-09-15 (×2): 2 mg via INTRAVENOUS
  Filled 2020-09-15 (×2): qty 1

## 2020-09-15 MED ORDER — GLYCOPYRROLATE 0.2 MG/ML IJ SOLN
0.2000 mg | INTRAMUSCULAR | Status: DC | PRN
  Filled 2020-09-15: qty 1

## 2020-09-16 NOTE — Progress Notes (Signed)
Patient die about 2250, 2 RN verified, Family was at bedside. MD notified as well.

## 2020-09-16 NOTE — Progress Notes (Signed)
Patient's family requested to deactivate pacemaker. Per MD ordered [phone] Notified. Per Rep, with this type of pacemake we don't need to do anything, it match to her life. Charge RN Chloe, RN was notified and Family was informed as well. Will continue to monitor patient.

## 2020-09-16 NOTE — Progress Notes (Signed)
Right sided chest tube removed per order. Moderate serosanguinous drainage from site. Vaseline guaze and pressure dressing applied. Pt is comfort care so no CXR was ordered, discussed with MD. Bedside RN notified of drainage and to assess dressing for reinforcement needs for patient comfort.

## 2020-09-16 NOTE — Progress Notes (Signed)
PROGRESS NOTE  [name] [id number] DOB: [birth year] DOA: 08/24/2020 PCP: Antony Contras, MD  Brief History   52yow PMH COPD, DM type 2, CKD stage IIIa, afib presented w/ shoulder pain left and SOB. Admitted for acute hypoxic resp failure, large right pleural effusion, WBC 39k. CT showed right effusion partially loculated w/ middle and lower lobe opacification combination of postobstructive and compressive atelectasis, concern for aspiration. Thoracentesis limited. Seen by pulmonology, dx w/ empyema, s/p pigtail catheter 4/26 at bedside w/ thrombolytic.  Empyema resolved however patient failed to improve clinically, remained encephalopathic and did not wake up.  Renal failure worsening, unable to eat or drink, unresponsive.  Not responding to treatment.  Sister did not want any further investigation.  In further discussion with sister/POA, decision made to proceed with full comfort care.  She understands death is expected in the next 24 hours.  A & P  Severe sepsis secondary to right empyema, aspiration pneumonia with associated right pleural effusion in long-time smoker w/ associated acute metabolic encephalopathy (baseline lived independently w/o assistance and no confusion). CT showed loculated effusion, concern for aspiration, RML and RLL disease, as well as patchy airspace disease LUL, lingula, LLL.  -- Empyema resolved with minimal chest tube output, discussed with pulmonology previously.  Remove chest tube today.  Given failure to improve clinically, stop antibiotics according to goals of care.  Acute kidney injury superimposed on CKD stable IIIa -- Renal function appears to have plateaued, urine output adequate but overall failing to improve.  No further treatment or evaluation based on goals of care.  Mild hyperkalemia -- Trivial.  No further evaluation or treatment based on goals of care.  Dysphagia unspecified -- Remain NPO, but should patient wake up, comfort feeds would be  given.  Hyponatremia -- Resolved.  Permanent atrial fibrillation  --stable.    PMH tachybrady syndrome, CHB, s/p AV junction ablation 2012, s/p [employer]tronic pacemaker Essential HTN --stable.  Notify [employer].  Hypothyroidism  -- No treatment  DM type 2 -- Stop monitoring  Depression, husband recently died  Aortic atherosclerosis  --no tx indicated  Disposition Plan:  Discussion: Patient appears worse today, respiratory status declining, appears to be dying.  Discussed in detail with sister at bedside.  Given failure to clinically improve, ongoing encephalopathy, declining respiratory status and persistent kidney injury, recovery is not expected.  Sister wish to proceed with full comfort care which is reasonable.  Briefly discussed with pulmonology.  Will proceed with full comfort care.  Inpatient death expected.  Status is: Inpatient  Remains inpatient appropriate because:IV treatments appropriate due to intensity of illness or inability to take PO and Inpatient level of care appropriate due to severity of illness   Dispo: The patient is from: Home              Anticipated d/c is to: TBD residential hospice unless patient passes in the next 24 to 48 hours.              Patient currently is not medically stable to d/c.   Difficult to place patient No  DVT prophylaxis:    Code Status: DNR Level of care: Med-Surg Family Communication: sister at bedside  Murray Hodgkins, MD  [facility] Direct contact: see [email or web address] (further directions at bottom of note if needed) 7PM-7AM contact night coverage as at bottom of note 09/29/2020, 8:57 AM  LOS: 4 days   Significant Hospital Events   . 4/25 admit for acute hypoxic resp failure, pleural effusion  Consults:  . Pulmonology    Procedures:  . 4/26 Ultrasound-guided diagnostic and therapeutic right thoracentesis performed yielding 60 cc of turbid, yellow fluid. . 4/26 Pleural Catheter Insertion w/ Imaging Guidance,  Pleural Fibrinolysis Initial day   Significant Diagnostic Tests:  . 4/26 Echo LVEF 60-65%   Micro Data:  . BC NGTD . UC NG . Pleural fluid NG/F   Antimicrobials:  . Cefepime 4/26 > . Metronidazole 4/26 > . Vancomycin 4/26 >  Interval History/Subjective  CC: f/u pneumonia  Moaned overnight per sister.  Not responsive to family.  Color looks worse. Good urine output per nursing.  Objective   Vitals:  Vitals:   05-Oct-2020 0240 10/05/20 0618  BP: (!) 124/47 (!) 127/47  Pulse: 60 (!) 59  Resp: 18 16  Temp: 100.2 F (37.9 C) (!) 101.2 F (38.4 C)  SpO2: 93% 92%    Exam: Constitutional:   . Appears to be dying.  Does not respond to sternal rub or voice. Respiratory:  . Shallow breaths.  Some accessory muscle use.  Poor air movement.  No frank wheezes, rales or rhonchi.  Moderate increased respiratory effort. Cardiovascular:  . RRR, no m/r/g . No LE extremity edema   Abdomen:  . Soft ntnd Psychiatric:  . Mental status . Appears comatose, does not respond to voice or touch   I have personally reviewed the following:   Today's Data  . CBG remained stable.  Potassium stable 5.5.  Creatinine without significant change 2.76.  Phosphorus high, magnesium high.  Scheduled Meds: . Chlorhexidine Gluconate Cloth  6 each Topical Daily  . sodium chloride flush  10 mL Intracatheter Q8H  . sodium chloride flush  3 mL Intravenous Q12H   Continuous Infusions: . sodium chloride 250 mL (09/12/20 1025)    Principal Problem:   Empyema (HCC) Active Problems:   Permanent atrial fibrillation (HCC)   Pacemaker-Medtronic   Acute hypoxemic respiratory failure (HCC)   Pleural effusion on right   Severe sepsis (HCC)   Acute metabolic encephalopathy   Dysphagia   CKD (chronic kidney disease), stage III ([place])   LOS: 4 days   How to contact the [facility] Attending or Consulting provider 7A - 7P or covering provider during after hours [place], for this patient?  1. Check the care team in  [facility] and look for a) attending/consulting [facility] provider listed and b) the [facility] team listed 2. Log into [email or web address] and use [place]'s universal password to access. If you do not have the password, please contact the hospital operator. 3. Locate the [facility] provider you are looking for under [facility] and page to a number that you can be directly reached. 4. If you still have difficulty reaching the provider, please page the [facility] (Director on Call) for the Hospitalists listed on amion for assistance.

## 2020-09-16 DEATH — deceased

## 2020-10-01 ENCOUNTER — Other Ambulatory Visit: Payer: Self-pay | Admitting: Cardiology

## 2020-10-17 NOTE — Death Summary Note (Addendum)
DEATH SUMMARY   Patient Details  Name: [name] MRN: [id number] DOB: 1928/07/29  Admission/Discharge Information   Admit Date:  08/19/2020  Date of Death: Date of Death: 20-Sep-2020  Time of Death: Time of Death: 2248-08-09  Length of Stay: 4  Referring Physician: Antony Contras, MD   Reason(s) for Hospitalization  Acute hypoxic respiratory failure, empyema.  Diagnoses  Preliminary cause of death:   Severe sepsis secondary to right-sided empyema, aspiration pneumonia Secondary Diagnoses (including complications and co-morbidities):  Principal Problem:   Empyema ([place]) Active Problems:   Permanent atrial fibrillation (HCC)   Pacemaker-[employer]   Acute hypoxemic respiratory failure (HCC)   Pleural effusion on right   Severe sepsis (HCC)   Acute metabolic encephalopathy   Dysphagia   CKD (chronic kidney disease), stage III (HCC) Right-sided pleural effusion COPD Acute kidney injury Diabetes mellitus type 2  Brief Hospital Course (including significant findings, care, treatment, and services provided and events leading to death)  3yow PMH COPD, DM type 2, CKD stage IIIa, afib presented w/ shoulder pain left and SOB. Admitted for acute hypoxic resp failure, large right pleural effusion, WBC 39k. CT showed right effusion partially loculated w/ middle and lower lobe opacification combination of postobstructive and compressive atelectasis, concern for aspiration. Thoracentesis limited. Seen by pulmonology, dx w/ empyema, s/p pigtail catheter 4/26 at bedside w/ thrombolytic.  Empyema resolved however patient failed to improve clinically, remained encephalopathic and did not wake up.  Renal failure worsened, to encephalopathic too eat or drink, unresponsive.    Did not respond to treatment. Sister did not want any further investigation or escalation of care.  In further discussion with sister/POA, decision made to proceed with full comfort care.  In-hospital death was expected.  Pertinent  Labs and Studies  Significant Diagnostic Studies DG Chest 1 View  Result Date: 09/11/2020 CLINICAL DATA:  Post thoracentesis. EXAM: CHEST  1 VIEW COMPARISON:  CT 09/03/2020.  Chest x-ray 08/25/2020. FINDINGS: Cardiac pacer in stable position. Heart size stable. Persistent right lower lobe atelectasis/consolidation and diffuse bilateral pulmonary infiltrates/edema. Persistent moderate right pleural effusion. A small collection of pleural air is noted on the right following thoracentesis. Degenerative change thoracic spine. IMPRESSION: 1. Small collection of air in the right pleural space following thoracentesis. No prominent pneumothorax. These findings were given to PA Allred at the time of this dictation. 2. Persistent right lower lobe atelectasis/consolidation and bilateral pulmonary infiltrates/edema. 3.  Cardiac pacer in stable position. Electronically Signed   By: Marcello Moores  Register   On: 09/11/2020 13:00   CT Chest Wo Contrast  Result Date: September 15, 2020 CLINICAL DATA:  Abnormal xray - pleural effusion Cough. EXAM: CT CHEST WITHOUT CONTRAST TECHNIQUE: Multidetector CT imaging of the chest was performed following the standard protocol without IV contrast. COMPARISON:  Radiograph earlier today. FINDINGS: Cardiovascular: Pacemaker in place with leads in the right atrium and ventricle. Borderline cardiomegaly. There are coronary artery calcifications. Mild mitral annulus calcifications. Aortic atherosclerosis and tortuosity. Mediastinum/Nodes: Detailed assessment for adenopathy is limited in the absence of IV contrast as well as paucity of mediastinal fat. Probable 11 mm right paratracheal node, series 2, image 51. 10 mm anterior paratracheal node, series 2, image 58. Mid and upper esophagus is patulous. No obvious thyroid nodule. Lungs/Pleura: Moderate right pleural effusion which is partially loculated as well as tracking into the fissures. Debris/retained secretions noted within the dependent trachea,  extending into the right and left mainstem bronchi. There is complete filling of the right lower lobe bronchus, segmental bronchi,  and some segmental branches. Near complete opacification of the right lower lobe likely combination of postobstructive and compressive atelectasis. There is complete opacification of the right middle lobe, primarily due to compressive change from pleural fluid, components of bronchial filling is also seen. There is bronchial filling involving the anterior right upper lobe segmental branches. No obvious centrally obstructing mass, although evaluation is limited on this noncontrast exam. There is airway thickening throughout the right upper lobe. Air a filling of the segmental branches of the left lower lobe with patchy and ground-glass peripheral opacity at the left lung base. Small left pleural effusion. Perifissural patchy ground-glass opacity in the dependent perifissural left upper lobe as well as lingula. Occasional areas of septal thickening. Upper Abdomen: Motion artifact through the upper abdomen. No obvious acute finding. Calcified granuloma in the liver. Atherosclerosis of upper abdominal vasculature. Musculoskeletal: Advanced degenerative disc disease at T1-T2 with Modic endplate changes. Mild thoracic scoliosis and degenerative change. Focal bone lesion or acute osseous abnormality. No chest wall soft tissue abnormality. IMPRESSION: 1. Moderate right pleural effusion which is partially loculated as well as tracking into the fissures. 2. Significant debris retained secretions in the tracheobronchial tree, with filling of the right greater than left bronchi. Complete right middle lobe and near complete opacification of the right lower lobe likely combination of postobstructive and compressive atelectasis. There is complete filling of the right lower lobe bronchus, segmental bronchi, and some segmental branches. Possibility of aspiration is raised given patulous esophagus. 3.  Consider follow-up imaging after improvement of pleural effusion and re-expansion of the right lung. No obvious centrally obstructing or pulmonary mass. 4. Small left pleural effusion. Patchy airspace disease in the dependent left upper lobe, lingula, and left lower lobe may be pneumonia or aspiration. 5. Few prominent mediastinal lymph nodes are likely reactive. Detailed mediastinal assessment is limited on this noncontrast exam. 6. Borderline cardiomegaly with coronary artery calcifications. Aortic atherosclerosis. Aortic Atherosclerosis (ICD10-I70.0). Electronically Signed   By: Keith Rake M.D.   On: 08/23/2020 18:15   DG CHEST PORT 1 VIEW  Result Date: 09/13/2020 CLINICAL DATA:  87 year old female with history of pleural effusion/empyema. Chest tube and pleural fibrinolysis. Shortness of breath, hypoxic in the 70s on presentation. EXAM: PORTABLE CHEST 1 VIEW COMPARISON:  Portable chest 09/12/2020 and earlier. Including chest CT 09/11/2020. FINDINGS: Portable AP semi upright view at 0904 hours. Right pleural drain remains in place with substantially decreased right pleural effusion and improved right lung ventilation from yesterday. Curvilinear right infrahilar lung opacity persists. Stable cardiac size and mediastinal contours. Stable left chest cardiac pacemaker. Pulmonary vascularity appears increased since 09/11/2020. No pneumothorax. Stable mild veiling opacity at the left lung base. Visualized tracheal air column is within normal limits. No acute osseous abnormality identified. Paucity of bowel gas in the upper abdomen. IMPRESSION: 1. Increased pulmonary vascularity since 09/11/2020. Consider acute interstitial edema. 2. Otherwise stable right pleural drain remains with substantially decreased pleural effusion and improved right lung ventilation since yesterday. Residual curvilinear lower lung opacity might be loculated fluid or airspace disease. Electronically Signed   By: Genevie Ann M.D.   On:  09/13/2020 10:04   DG CHEST PORT 1 VIEW  Result Date: 09/12/2020 CLINICAL DATA:  Dyspnea EXAM: PORTABLE CHEST 1 VIEW COMPARISON:  09/11/2020 FINDINGS: Right basilar pigtail chest tube is again noted. Large right pleural effusion, however, has slightly increased in size since prior examination. Superimposed interstitial infiltrate is seen throughout the aerated lungs most in keeping with mild, diffuse interstitial edema. Retrocardiac  opacification is present in keeping with atelectasis or infiltrate within this region. No pneumothorax. Left subclavian dual lead pacemaker is unchanged. Cardiac silhouette is largely obscured by basilar opacity. No acute bone abnormality. IMPRESSION: Right basilar pigtail chest tube in place. Despite this, interval enlargement of a large right pleural effusion suggesting either loculation or exclusion of the chest tube. CT imaging may be helpful for further evaluation. Stable diffuse interstitial pulmonary infiltrate most in keeping with interstitial pulmonary edema. Electronically Signed   By: Fidela Salisbury MD   On: 09/12/2020 06:05   DG CHEST PORT 1 VIEW  Result Date: 09/11/2020 CLINICAL DATA:  Status post chest tube placement. EXAM: PORTABLE CHEST 1 VIEW COMPARISON:  Radiograph earlier this day.  CT yesterday. FINDINGS: Pigtail catheter projects over the right lower hemithorax. No significant change in the right pleural effusion tracking laterally allowing for differences in positioning. No visualized pneumothorax. Left-sided pacemaker in place. Aortic atherosclerosis with stable heart size, mediastinal contours are obscured. Multifocal patchy airspace disease which was better seen on CT yesterday. IMPRESSION: 1. New right pigtail catheter projecting over the right lower hemithorax. No pneumothorax. Grossly unchanged right pleural effusion tracking laterally. 2. Multifocal patchy airspace disease, better seen on CT yesterday. Electronically Signed   By: Keith Rake M.D.    On: 09/11/2020 18:49   DG Chest Port 1 View  Result Date: 08/17/2020 CLINICAL DATA:  Dyspnea.  Shortness of breath.  Cough. EXAM: PORTABLE CHEST 1 VIEW COMPARISON:  08/20/2015 FINDINGS: Large right pleural effusion occupies the lower 1/2 of right hemithorax, and tracks laterally at the apex. Trace left pleural effusion suspected. Left-sided pacemaker in place. Heart size grossly normal, right heart border obscured. Aortic atherosclerosis. There is vascular congestion with mild interstitial thickening, possible edema. No pneumothorax. Bones are under mineralized without acute osseous abnormality. IMPRESSION: 1. Large right pleural effusion spanning the lower 1/2 of the right hemithorax. 2. Trace left pleural effusion suspected. 3. Vascular congestion with possible interstitial edema. Electronically Signed   By: Keith Rake M.D.   On: 08/24/2020 15:50   ECHOCARDIOGRAM COMPLETE  Result Date: 09/11/2020    ECHOCARDIOGRAM REPORT   Patient Name:   SAMYRA DIGAETANO Date of Exam: 09/11/2020 Medical Rec #:  [id number]     Height:       63.0 in Accession #:    [id number]    Weight:       108.7 lb Date of Birth:  1928/06/08     BSA:          1.492 m Patient Age:    25 years      BP:           137/47 mmHg Patient Gender: F             HR:           80 bpm. Exam Location:  Inpatient Procedure: 2D Echo, Color Doppler and Cardiac Doppler Indications:    CHF-Acute Diastolic [id number]  History:        Patient has prior history of Echocardiogram examinations, most                 recent 01/26/2017. TIA and COPD, Arrythmias:Atrial Fibrillation;                 Risk Factors:Hypertension and Diabetes.  Sonographer:    Bernadene Person RDCS Referring Phys: [place]  1. Left ventricular ejection fraction, by estimation, is 60 to 65%. The left ventricle has normal function.  The left ventricle has no regional wall motion abnormalities. Left ventricular diastolic function could not be evaluated.  2. Right  ventricular systolic function is normal. The right ventricular size is normal. There is mildly elevated pulmonary artery systolic pressure.  3. Left atrial size was mildly dilated.  4. Right atrial size was mildly dilated.  5. The mitral valve is normal in structure. Trivial mitral valve regurgitation. No evidence of mitral stenosis.  6. Tricuspid valve regurgitation is mild to moderate.  7. The aortic valve is normal in structure. Aortic valve regurgitation is not visualized. No aortic stenosis is present.  8. The inferior vena cava is normal in size with greater than 50% respiratory variability, suggesting right atrial pressure of 3 mmHg. FINDINGS  Left Ventricle: Left ventricular ejection fraction, by estimation, is 60 to 65%. The left ventricle has normal function. The left ventricle has no regional wall motion abnormalities. The left ventricular internal cavity size was normal in size. There is  no left ventricular hypertrophy. Left ventricular diastolic function could not be evaluated due to atrial fibrillation. Left ventricular diastolic function could not be evaluated. Right Ventricle: The right ventricular size is normal. No increase in right ventricular wall thickness. Right ventricular systolic function is normal. There is mildly elevated pulmonary artery systolic pressure. The tricuspid regurgitant velocity is 2.91  m/s, and with an assumed right atrial pressure of 3 mmHg, the estimated right ventricular systolic pressure is 123456 mmHg. Left Atrium: Left atrial size was mildly dilated. Right Atrium: Right atrial size was mildly dilated. Pericardium: There is no evidence of pericardial effusion. Mitral Valve: The mitral valve is normal in structure. Mild mitral annular calcification. Trivial mitral valve regurgitation. No evidence of mitral valve stenosis. Tricuspid Valve: The tricuspid valve is normal in structure. Tricuspid valve regurgitation is mild to moderate. No evidence of tricuspid stenosis.  Aortic Valve: The aortic valve is normal in structure. Aortic valve regurgitation is not visualized. No aortic stenosis is present. Pulmonic Valve: The pulmonic valve was normal in structure. Pulmonic valve regurgitation is mild. No evidence of pulmonic stenosis. Aorta: The aortic root is normal in size and structure. Venous: The inferior vena cava is normal in size with greater than 50% respiratory variability, suggesting right atrial pressure of 3 mmHg. IAS/Shunts: No atrial level shunt detected by color flow Doppler. Additional Comments: A device lead is visualized in the right atrium and right ventricle.  LEFT VENTRICLE PLAX 2D LVIDd:         3.80 cm LVIDs:         2.30 cm LV PW:         0.80 cm LV IVS:        1.00 cm LVOT diam:     1.50 cm LV SV:         50 LV SV Index:   34 LVOT Area:     1.77 cm  RIGHT VENTRICLE TAPSE (M-mode): 1.6 cm LEFT ATRIUM             Index       RIGHT ATRIUM           Index LA diam:        3.70 cm 2.48 cm/m  RA Area:     17.60 cm LA Vol (A2C):   48.7 ml 32.64 ml/m RA Volume:   53.00 ml  35.52 ml/m LA Vol (A4C):   48.9 ml 32.77 ml/m LA Biplane Vol: 49.8 ml 33.37 ml/m  AORTIC VALVE LVOT Vmax:   168.33  cm/s LVOT Vmean:  110.667 cm/s LVOT VTI:    0.283 m  AORTA Ao Root diam: 2.80 cm Ao Asc diam:  2.90 cm TRICUSPID VALVE TR Peak grad:   33.9 mmHg TR Vmax:        291.00 cm/s  SHUNTS Systemic VTI:  0.28 m Systemic Diam: 1.50 cm Sanda Klein MD Electronically signed by Sanda Klein MD Signature Date/Time: 09/11/2020/11:06:34 AM    Final    CUP PACEART REMOTE DEVICE CHECK  Result Date: 08/27/2020 Scheduled remote reviewed. Normal device function.  Next remote 91 days. LH  US THORACENTESIS ASP PLEURAL SPACE W/IMG GUIDE  Result Date: 09/11/2020 INDICATION: Patient with history of tobacco abuse, COPD, chronic kidney disease, atrial fibrillation status post ablation/pacemaker dependent, weakness, dyspnea, loculated right pleural effusion. Request received for diagnostic and  therapeutic right thoracentesis. EXAM: ULTRASOUND GUIDED DIAGNOSTIC AND THERAPEUTIC RIGHT THORACENTESIS MEDICATIONS: 1% lidocaine to skin and subcutaneous tissue COMPLICATIONS: None immediate. PROCEDURE: An ultrasound guided thoracentesis was thoroughly discussed with the patient/sister and questions answered. The benefits, risks, alternatives and complications were also discussed. The patient/sister understands and wishes to proceed with the procedure. Written consent was obtained. Ultrasound was performed to localize and mark an adequate pocket of fluid in the right chest. The area was then prepped and draped in the normal sterile fashion. 1% Lidocaine was used for local anesthesia. Under ultrasound guidance a 6 Fr Safe-T-Centesis catheter was introduced. Thoracentesis was performed. The catheter was removed and a dressing applied. FINDINGS: A total of approximately 60 cc of turbid, yellow fluid was removed. Samples were sent to the laboratory as requested by the clinical team. Due to the extensive multiloculated nature of the pleural fluid collection and despite catheter manipulation only the above amount of fluid could be removed. IMPRESSION: Successful ultrasound guided diagnostic and therapeutic right thoracentesis yielding 60 cc of pleural fluid. Read by: Rowe Robert, PA-C Electronically Signed   By: Markus Daft M.D.   On: 09/11/2020 12:59    Microbiology Recent Results (from the past 240 hour(s))  Culture, blood (routine x 2)     Status: None   Collection Time: 08/18/2020  2:58 PM   Specimen: BLOOD  Result Value Ref Range Status   Specimen Description   Final    BLOOD LEFT ARM Performed at [facility] [address] [place], [place] [address]    Special Requests   Final    BOTTLES DRAWN AEROBIC AND ANAEROBIC Blood Culture adequate volume Performed at [facility] [address] [place], [place] [address]    Culture   Final    NO GROWTH 5  DAYS Performed at [facility], [place] [address], [place], [place] [address]    Report Status Oct 11, 2020 FINAL  Final  Culture, blood (routine x 2)     Status: None   Collection Time: 09/14/2020  2:58 PM   Specimen: BLOOD  Result Value Ref Range Status   Specimen Description   Final    BLOOD LEFT ARM Performed at [facility] [address], [place], [place] [address]    Special Requests   Final    BOTTLES DRAWN AEROBIC AND ANAEROBIC Blood Culture adequate volume Performed at [facility] [address] [place], [place] [address]    Culture   Final    NO GROWTH 5 DAYS Performed at [facility], [place] [address] [place], [place] [address]    Report Status 10/11/20 FINAL  Final  Resp Panel by RT-PCR (Flu A&B, Covid) Nasopharyngeal Swab     Status: None   Collection Time: 08/19/2020  3:00 PM   Specimen: Nasopharyngeal Swab; Nasopharyngeal(NP) swabs in vial transport medium  Result Value Ref Range Status   SARS Coronavirus 2 by RT PCR NEGATIVE NEGATIVE Final    Comment: (NOTE) SARS-CoV-2 target nucleic acids are NOT DETECTED.  The SARS-CoV-2 RNA is generally detectable in upper respiratory specimens during the acute phase of infection. The lowest concentration of SARS-CoV-2 viral copies this assay can detect is 138 copies/mL. A negative result does not preclude SARS-Cov-2 infection and should not be used as the sole basis for treatment or other patient management decisions. A negative result may occur with  improper specimen collection/handling, submission of specimen other than nasopharyngeal swab, presence of viral mutation(s) within the areas targeted by this assay, and inadequate number of viral copies(<138 copies/mL). A negative result must be combined with clinical observations, patient history, and epidemiological information. The expected result is Negative.  Fact Sheet for Patients:   EntrepreneurPulse.com.au  Fact Sheet for Healthcare Providers:  IncredibleEmployment.be  This test is no t yet approved or cleared by the [place] FDA and  has been authorized for detection and/or diagnosis of SARS-CoV-2 by FDA under an Emergency Use Authorization (EUA). This EUA will remain  in effect (meaning this test can be used) for the duration of the COVID-19 declaration under Section 564(b)(1) of the Act, 21 U.S.C.section 360bbb-3(b)(1), unless the authorization is terminated  or revoked sooner.       Influenza A by PCR NEGATIVE NEGATIVE Final   Influenza B by PCR NEGATIVE NEGATIVE Final    Comment: (NOTE) The Xpert Xpress SARS-CoV-2/FLU/RSV plus assay is intended as an aid in the diagnosis of influenza from Nasopharyngeal swab specimens and should not be used as a sole basis for treatment. Nasal washings and aspirates are unacceptable for Xpert Xpress SARS-CoV-2/FLU/RSV testing.  Fact Sheet for Patients: EntrepreneurPulse.com.au  Fact Sheet for Healthcare Providers: IncredibleEmployment.be  This test is not yet approved or cleared by the [place] FDA and has been authorized for detection and/or diagnosis of SARS-CoV-2 by FDA under an Emergency Use Authorization (EUA). This EUA will remain in effect (meaning this test can be used) for the duration of the COVID-19 declaration under Section 564(b)(1) of the Act, 21 U.S.C. section 360bbb-3(b)(1), unless the authorization is terminated or revoked.  Performed at [facility], [place] [address] [place], [place] [address]   Urine culture     Status: None   Collection Time: 08/22/2020  4:54 PM   Specimen: Urine, Clean Catch  Result Value Ref Range Status   Specimen Description   Final    URINE, CLEAN CATCH Performed at [facility], [place] [address] [place], [place] [address]    Special Requests   Final     NONE Performed at [facility], [place] [address], [place], [place] [address]    Culture   Final    NO GROWTH Performed at [facility], [place] [address], [place], [place] [address]    Report Status 09/12/2020 FINAL  Final  Body fluid culture w Gram Stain     Status: None   Collection Time: 09/11/20 12:40 PM   Specimen: PATH Cytology Pleural fluid  Result Value Ref Range Status   Specimen Description   Final    PLEURAL Performed at [facility] [address], [place], [place] [address]  Special Requests   Final    NONE Performed at [facility], [place] [address], [place], [place] [address]    Gram Stain   Final    FEW WBC PRESENT,BOTH PMN AND MONONUCLEAR NO ORGANISMS SEEN    Culture   Final    NO GROWTH 3 DAYS Performed at [facility], [place] [address], [place], [place] [address]    Report Status Oct 05, 2020 FINAL  Final  MRSA PCR Screening     Status: None   Collection Time: 09/12/20  2:50 PM   Specimen: Nasal Mucosa; Nasopharyngeal  Result Value Ref Range Status   MRSA by PCR NEGATIVE NEGATIVE Final    Comment:        The GeneXpert MRSA Assay (FDA approved for NASAL specimens only), is one component of a comprehensive MRSA colonization surveillance program. It is not intended to diagnose MRSA infection nor to guide or monitor treatment for MRSA infections. Performed at [facility], [place] [address], [place], [place] [address]     Lab Basic Metabolic Panel: Recent Labs  Lab 09/11/20 0345 09/12/20 0332 09/13/20 1553 09/14/20 0342 10-05-20 0307  NA 125* 132* 137 139 142  K 4.9 5.0 5.0 5.4* 5.5*  CL 89* 91* 99 106 112*  CO2 '24 27 27 '$ 20* 21*  GLUCOSE 173* 170* 147* 159* 137*  BUN 24* 38* 85* 88* 102*  CREATININE 1.35* 1.70* 2.83* 2.75* 2.76*  CALCIUM 8.9 9.1 8.6* 8.6* 8.6*  MG  --  1.7 2.3 2.3 2.5*  PHOS  --  5.8* 5.8* 6.1* 5.5*   Liver Function Tests: Recent Labs   Lab 08/29/2020 1458 09/11/20 0345 09/12/20 0332  AST 27 15 14*  ALT '22 18 17  '$ ALKPHOS 104 102 110  BILITOT 1.0 0.6 0.8  PROT 6.8 5.8* 5.9*  ALBUMIN 3.1* 2.6* 2.5*   Recent Labs  Lab 09/11/2020 1458  LIPASE 20   CBC: Recent Labs  Lab 09/13/2020 1458 09/11/20 0345 09/12/20 0332 09/14/20 0342  WBC 39.0* 42.7* 42.2* 34.9*  NEUTROABS 35.4*  --  38.8*  --   HGB 13.8 12.7 13.6 12.3  HCT 42.2 39.1 43.4 39.4  MCV 95.5 95.1 98.6 99.7  PLT 203 194 236 221   C Recent Labs  Lab 08/28/2020 1458 09/11/20 0345 09/12/20 0332 09/14/20 0342  WBC 39.0* 42.7* 42.2* 34.9*  LATICACIDVEN 1.7  --   --   --     Procedures/Operations     Murray Hodgkins 09/16/2020, 3:38 PM
